# Patient Record
Sex: Male | Born: 1955 | Race: White | Hispanic: No | Marital: Married | Smoking: Former smoker
Health system: Southern US, Community
[De-identification: ages and names within clinical notes are randomized; demographics above are authoritative.]

## PROBLEM LIST (undated history)

## (undated) DIAGNOSIS — F329 Major depressive disorder, single episode, unspecified: Secondary | ICD-10-CM

## (undated) DIAGNOSIS — E291 Testicular hypofunction: Secondary | ICD-10-CM

## (undated) DIAGNOSIS — J309 Allergic rhinitis, unspecified: Secondary | ICD-10-CM

## (undated) DIAGNOSIS — K219 Gastro-esophageal reflux disease without esophagitis: Secondary | ICD-10-CM

## (undated) DIAGNOSIS — I10 Essential (primary) hypertension: Secondary | ICD-10-CM

## (undated) DIAGNOSIS — F32A Depression, unspecified: Secondary | ICD-10-CM

## (undated) DIAGNOSIS — E785 Hyperlipidemia, unspecified: Secondary | ICD-10-CM

## (undated) DIAGNOSIS — F419 Anxiety disorder, unspecified: Secondary | ICD-10-CM

## (undated) DIAGNOSIS — T7840XA Allergy, unspecified, initial encounter: Secondary | ICD-10-CM

## (undated) HISTORY — PX: INGUINAL HERNIA REPAIR: SHX194

## (undated) HISTORY — DX: Gastro-esophageal reflux disease without esophagitis: K21.9

## (undated) HISTORY — PX: GASTRIC FUNDOPLICATION: SHX226

## (undated) HISTORY — DX: Testicular hypofunction: E29.1

## (undated) HISTORY — DX: Allergic rhinitis, unspecified: J30.9

## (undated) HISTORY — DX: Major depressive disorder, single episode, unspecified: F32.9

## (undated) HISTORY — DX: Depression, unspecified: F32.A

## (undated) HISTORY — PX: CHOLECYSTECTOMY: SHX55

## (undated) HISTORY — DX: Anxiety disorder, unspecified: F41.9

## (undated) HISTORY — PX: BASAL CELL CARCINOMA EXCISION: SHX1214

## (undated) HISTORY — DX: Allergy, unspecified, initial encounter: T78.40XA

## (undated) HISTORY — PX: VASECTOMY: SHX75

## (undated) HISTORY — PX: PARTIAL NEPHRECTOMY: SHX414

## (undated) HISTORY — DX: Hyperlipidemia, unspecified: E78.5

## (undated) HISTORY — DX: Essential (primary) hypertension: I10

---

## 2009-07-14 ENCOUNTER — Ambulatory Visit: Payer: Self-pay | Admitting: Urology

## 2011-12-21 DIAGNOSIS — N4340 Spermatocele of epididymis, unspecified: Secondary | ICD-10-CM | POA: Insufficient documentation

## 2013-06-18 DIAGNOSIS — F419 Anxiety disorder, unspecified: Secondary | ICD-10-CM | POA: Insufficient documentation

## 2013-06-18 DIAGNOSIS — E559 Vitamin D deficiency, unspecified: Secondary | ICD-10-CM | POA: Insufficient documentation

## 2013-06-18 DIAGNOSIS — K219 Gastro-esophageal reflux disease without esophagitis: Secondary | ICD-10-CM | POA: Insufficient documentation

## 2013-06-18 DIAGNOSIS — E291 Testicular hypofunction: Secondary | ICD-10-CM | POA: Insufficient documentation

## 2014-01-12 DIAGNOSIS — Z79899 Other long term (current) drug therapy: Secondary | ICD-10-CM | POA: Insufficient documentation

## 2014-04-05 ENCOUNTER — Emergency Department: Payer: Self-pay | Admitting: Emergency Medicine

## 2014-04-07 ENCOUNTER — Emergency Department: Payer: Self-pay | Admitting: Emergency Medicine

## 2014-04-30 HISTORY — PX: COLONOSCOPY: SHX174

## 2014-04-30 LAB — HM COLONOSCOPY

## 2014-09-03 DIAGNOSIS — D3000 Benign neoplasm of unspecified kidney: Secondary | ICD-10-CM | POA: Insufficient documentation

## 2014-09-11 ENCOUNTER — Other Ambulatory Visit: Payer: Self-pay | Admitting: Family Medicine

## 2014-09-11 MED ORDER — FENOFIBRATE 145 MG PO TABS: 145.0000 mg | ORAL_TABLET | Freq: Every day | ORAL | Status: DC

## 2014-11-13 ENCOUNTER — Encounter: Payer: Self-pay | Admitting: Family Medicine

## 2014-11-13 ENCOUNTER — Ambulatory Visit (INDEPENDENT_AMBULATORY_CARE_PROVIDER_SITE_OTHER): Payer: BLUE CROSS/BLUE SHIELD | Admitting: Family Medicine

## 2014-11-13 VITALS — BP 117/78 | HR 89 | Temp 98.0°F | Resp 16 | Ht 72.0 in | Wt 209.6 lb

## 2014-11-13 DIAGNOSIS — I10 Essential (primary) hypertension: Secondary | ICD-10-CM

## 2014-11-13 DIAGNOSIS — D3002 Benign neoplasm of left kidney: Secondary | ICD-10-CM

## 2014-11-13 DIAGNOSIS — D3000 Benign neoplasm of unspecified kidney: Secondary | ICD-10-CM | POA: Insufficient documentation

## 2014-11-13 DIAGNOSIS — E291 Testicular hypofunction: Secondary | ICD-10-CM | POA: Insufficient documentation

## 2014-11-13 DIAGNOSIS — N2889 Other specified disorders of kidney and ureter: Secondary | ICD-10-CM | POA: Insufficient documentation

## 2014-11-13 DIAGNOSIS — K219 Gastro-esophageal reflux disease without esophagitis: Secondary | ICD-10-CM

## 2014-11-13 DIAGNOSIS — N529 Male erectile dysfunction, unspecified: Secondary | ICD-10-CM | POA: Insufficient documentation

## 2014-11-13 DIAGNOSIS — IMO0001 Reserved for inherently not codable concepts without codable children: Secondary | ICD-10-CM | POA: Insufficient documentation

## 2014-11-13 NOTE — Patient Instructions (Signed)
Continue medications

## 2014-11-13 NOTE — Progress Notes (Signed)
Name: Chris Daugherty   MRN: [id number]    DOB: 11/19/1955   Date:11/13/2014       Progress Note  Subjective  Chief Complaint  Chief Complaint  Patient presents with  . Hypertension    HPI  Here for f/u of HBP.  Had flair of siatica and back pain.  No problems now. No problem-specific assessment & plan notes found for this encounter.   Past Medical History  Diagnosis Date  . Hypertension   . Depression   . Allergy     Social History  Substance Use Topics  . Smoking status: Former Smoker    Quit date: 11/13/1974  . Smokeless tobacco: Never Used  . Alcohol Use: No     Current outpatient prescriptions:  .  amLODipine (NORVASC) 10 MG tablet, Take by mouth., Disp: , Rfl:  .  fenofibrate (TRICOR) 145 MG tablet, Take 1 tablet (145 mg total) by mouth daily., Disp: 90 tablet, Rfl: 0 .  losartan (COZAAR) 100 MG tablet, Take by mouth., Disp: , Rfl:  .  methocarbamol (ROBAXIN) 750 MG tablet, , Disp: , Rfl: 0 .  omeprazole (PRILOSEC) 20 MG capsule, Take 20 mg by mouth daily., Disp: , Rfl: 0 .  promethazine (PHENERGAN) 25 MG tablet, , Disp: , Rfl: 0 .  sertraline (ZOLOFT) 100 MG tablet, , Disp: , Rfl: 0 .  SUMAtriptan (IMITREX) 100 MG tablet, Take by mouth., Disp: , Rfl:  .  Vitamin D, Cholecalciferol, 400 UNITS TABS, Take by mouth., Disp: , Rfl:   Allergies  Allergen Reactions  . Acetaminophen-Codeine Rash    Review of Systems  Constitutional: Negative for fever, chills, weight loss and malaise/fatigue.  HENT: Negative for hearing loss.   Eyes: Negative for blurred vision and double vision.  Respiratory: Negative for cough, sputum production, shortness of breath and wheezing.   Cardiovascular: Negative for chest pain, palpitations, orthopnea and leg swelling.  Gastrointestinal: Negative for heartburn, nausea, vomiting, abdominal pain, diarrhea and blood in stool.  Genitourinary: Negative for dysuria, urgency and frequency.  Musculoskeletal: Positive for back pain.  Skin:  Negative for rash.  Neurological: Negative for dizziness, tingling, sensory change, focal weakness, weakness and headaches.  Psychiatric/Behavioral: Negative for depression.      Objective  Filed Vitals:   11/13/14 1120  BP: 117/78  Pulse: 89  Temp: 98 F (36.7 C)  Resp: 16  Height: 6' (1.829 m)  Weight: 209 lb 9.6 oz (95.074 kg)  SpO2: 99%     Physical Exam  Constitutional: He is oriented to person, place, and time and well-developed, well-nourished, and in no distress. No distress.  HENT:  Daugherty: Normocephalic and atraumatic.  Eyes: Conjunctivae and EOM are normal. Pupils are equal, round, and reactive to light.  Neck: Normal range of motion. Neck supple. Carotid bruit is not present. No thyromegaly present.  Cardiovascular: Normal rate, regular rhythm, normal heart sounds and intact distal pulses.  Exam reveals no gallop and no friction rub.   No murmur heard. Pulmonary/Chest: Effort normal and breath sounds normal. No respiratory distress. He has no wheezes. He has no rales.  Abdominal: Soft. Bowel sounds are normal. He exhibits no distension and no mass. There is no tenderness.  Musculoskeletal: Normal range of motion. He exhibits no edema.  Lymphadenopathy:    He has no cervical adenopathy.  Neurological: He is alert and oriented to person, place, and time.  Psychiatric: Mood, memory, affect and judgment normal.  Vitals reviewed.     No results found for this  or any previous visit (from the past 2160 hour(s)).   Assessment & Plan  1. Essential (primary) hypertension   2. Gastroesophageal reflux disease without esophagitis   3. Benign neoplasm of kidney, left

## 2014-11-19 ENCOUNTER — Telehealth: Payer: Self-pay | Admitting: Family Medicine

## 2014-11-19 NOTE — Telephone Encounter (Signed)
Relayed message that Dr. Luan Pulling is out of the office until 8/22. He will receive the msg at that time and we will f/u with his decision.

## 2014-11-19 NOTE — Telephone Encounter (Signed)
It doesn't look like he has ever discussed this with Dr. Luan Pulling. I would prefer to leave this decision up to him when he returns on Monday. Please inform the patient Dr. Luan Pulling on out of the office and will respond to his request on Monday.

## 2014-11-19 NOTE — Telephone Encounter (Signed)
Can we Rx this ? Chris Daugherty

## 2014-11-19 NOTE — Telephone Encounter (Signed)
Pt was seen by Dr. Manuella Ghazi about a year ago for migraines and was prescribed sethocarpamol 750 mg, sumatriptan 100 mg and promethazine 25 mg.  His prescription ran out and was told by Dr. Manuella Ghazi his primary doctor could fill them if he choose to do so.  Please call pt at [phone].

## 2014-11-23 NOTE — Telephone Encounter (Signed)
We have never really discussed his migraines at visits here.  I would need to see him specifically about these to decide to cont. These meds since I didn"t start them-jh

## 2014-11-23 NOTE — Telephone Encounter (Signed)
Called patient and relayed message per Dr. Luan Pulling. Appt scheduled 12/11/14, nothing further needed.

## 2014-12-03 ENCOUNTER — Other Ambulatory Visit: Payer: Self-pay | Admitting: Family Medicine

## 2014-12-11 ENCOUNTER — Encounter: Payer: Self-pay | Admitting: Family Medicine

## 2014-12-11 ENCOUNTER — Ambulatory Visit (INDEPENDENT_AMBULATORY_CARE_PROVIDER_SITE_OTHER): Payer: BLUE CROSS/BLUE SHIELD | Admitting: Family Medicine

## 2014-12-11 VITALS — BP 137/91 | HR 90 | Temp 98.0°F | Resp 16 | Ht 72.0 in | Wt 211.0 lb

## 2014-12-11 DIAGNOSIS — Z8669 Personal history of other diseases of the nervous system and sense organs: Secondary | ICD-10-CM | POA: Diagnosis not present

## 2014-12-11 DIAGNOSIS — Z23 Encounter for immunization: Secondary | ICD-10-CM

## 2014-12-11 DIAGNOSIS — G43909 Migraine, unspecified, not intractable, without status migrainosus: Secondary | ICD-10-CM | POA: Insufficient documentation

## 2014-12-11 MED ORDER — PROMETHAZINE HCL 25 MG PO TABS: 25.0000 mg | ORAL_TABLET | Freq: Four times a day (QID) | ORAL | Status: DC | PRN

## 2014-12-11 MED ORDER — SUMATRIPTAN SUCCINATE 100 MG PO TABS: 100.0000 mg | ORAL_TABLET | Freq: Once | ORAL | Status: DC

## 2014-12-11 MED ORDER — METHOCARBAMOL 750 MG PO TABS: ORAL_TABLET | ORAL | Status: DC

## 2014-12-11 NOTE — Progress Notes (Signed)
Name: Chris Daugherty   MRN: [id number]    DOB: 03-Oct-1955   Date:12/11/2014       Progress Note  Subjective  Chief Complaint  Chief Complaint  Patient presents with  . Migraine    have not had from past 2 month    HPI  Here for migraine headaches.  Has seen Dr. Manuella Ghazi in  The past and placed on medication (Imitrex, Phenergan, and Robaxin daily as prophylaxis.  Has not had a migraine in about 2 months.  Avg. About 10 migraines/yr, but seems less now. No problem-specific assessment & plan notes found for this encounter.   Past Medical History  Diagnosis Date  . Hypertension   . Depression   . Allergy     Social History  Substance Use Topics  . Smoking status: Former Smoker    Quit date: 11/13/1974  . Smokeless tobacco: Never Used  . Alcohol Use: No     Current outpatient prescriptions:  .  amLODipine (NORVASC) 10 MG tablet, Take by mouth., Disp: , Rfl:  .  fenofibrate (TRICOR) 145 MG tablet, take 1 tablet by mouth once daily, Disp: 90 tablet, Rfl: 0 .  losartan (COZAAR) 100 MG tablet, Take by mouth., Disp: , Rfl:  .  methocarbamol (ROBAXIN) 750 MG tablet, , Disp: , Rfl: 0 .  omeprazole (PRILOSEC) 20 MG capsule, Take 20 mg by mouth daily., Disp: , Rfl: 0 .  promethazine (PHENERGAN) 25 MG tablet, , Disp: , Rfl: 0 .  sertraline (ZOLOFT) 100 MG tablet, , Disp: , Rfl: 0 .  SUMAtriptan (IMITREX) 100 MG tablet, Take by mouth., Disp: , Rfl:  .  Vitamin D, Cholecalciferol, 400 UNITS TABS, Take by mouth., Disp: , Rfl:   Allergies  Allergen Reactions  . Acetaminophen-Codeine Rash    Review of Systems  Constitutional: Negative for fever and chills.  HENT: Negative for hearing loss.   Eyes: Negative for blurred vision and double vision.  Respiratory: Negative for cough, sputum production, shortness of breath and wheezing.   Cardiovascular: Negative for chest pain, palpitations and leg swelling.  Gastrointestinal: Negative for heartburn, abdominal pain and blood in stool.   Genitourinary: Negative for dysuria, urgency and frequency.  Skin: Negative for rash.  Neurological: Positive for headaches. Negative for dizziness, sensory change and focal weakness.      Objective  Filed Vitals:   12/11/14 1255  BP: 137/91  Pulse: 90  Temp: 98 F (36.7 C)  TempSrc: Oral  Resp: 16  Height: 6' (1.829 m)  Weight: 211 lb (95.709 kg)     Physical Exam  Constitutional: He is well-developed, well-nourished, and in no distress. No distress.  HENT:  Head: Normocephalic and atraumatic.  Eyes: Conjunctivae and EOM are normal. Pupils are equal, round, and reactive to light. No scleral icterus.  Fundoscopic exam:      The right eye shows no arteriolar narrowing, no AV nicking and no hemorrhage.       The left eye shows no arteriolar narrowing, no AV nicking and no hemorrhage.  Neck: Normal range of motion. Neck supple. No thyromegaly present.  Cardiovascular: Normal rate, regular rhythm, normal heart sounds and intact distal pulses.  Exam reveals no gallop and no friction rub.   No murmur heard. Pulmonary/Chest: Effort normal and breath sounds normal. No respiratory distress. He has no wheezes. He has no rales.  Lymphadenopathy:    He has no cervical adenopathy.  Neurological: He is alert. No cranial nerve deficit. Gait normal. Coordination normal.  Vitals  reviewed.     No results found for this or any previous visit (from the past 2160 hour(s)).   Assessment & Plan  1. Hx of migraine headaches   - methocarbamol (ROBAXIN) 750 MG tablet; Take 1 tablet twice a day.  Dispense: 60 tablet; Refill: 12 - promethazine (PHENERGAN) 25 MG tablet; Take 1 tablet (25 mg total) by mouth every 6 (six) hours as needed for nausea or vomiting.  Dispense: 30 tablet; Refill: 6 - SUMAtriptan (IMITREX) 100 MG tablet; Take 1 tablet (100 mg total) by mouth once.  Dispense: 9 tablet; Refill: 12  2. Need for influenza vaccination   - Flu Vaccine QUAD 36+ mos PF IM (Fluarix &  Fluzone Quad PF)

## 2015-02-26 ENCOUNTER — Other Ambulatory Visit: Payer: Self-pay | Admitting: Family Medicine

## 2015-03-19 ENCOUNTER — Encounter: Payer: Self-pay | Admitting: Family Medicine

## 2015-03-19 ENCOUNTER — Ambulatory Visit (INDEPENDENT_AMBULATORY_CARE_PROVIDER_SITE_OTHER): Payer: BLUE CROSS/BLUE SHIELD | Admitting: Family Medicine

## 2015-03-19 VITALS — BP 120/70 | HR 105 | Temp 97.8°F | Resp 16 | Ht 72.0 in | Wt 211.0 lb

## 2015-03-19 DIAGNOSIS — G4700 Insomnia, unspecified: Secondary | ICD-10-CM | POA: Diagnosis not present

## 2015-03-19 DIAGNOSIS — F419 Anxiety disorder, unspecified: Secondary | ICD-10-CM

## 2015-03-19 DIAGNOSIS — I10 Essential (primary) hypertension: Secondary | ICD-10-CM

## 2015-03-19 MED ORDER — TEMAZEPAM 7.5 MG PO CAPS
7.5000 mg | ORAL_CAPSULE | Freq: Every evening | ORAL | Status: DC | PRN
Start: 2015-03-19 — End: 2015-08-19

## 2015-03-19 NOTE — Addendum Note (Signed)
Addended by: Devona Konig on: 03/19/2015 11:29 AM   Modules accepted: Orders

## 2015-03-19 NOTE — Progress Notes (Signed)
Name: Chris Daugherty   MRN: [id number]    DOB: Nov 13, 1955   Date:03/19/2015       Progress Note  Subjective  Chief Complaint  Chief Complaint  Patient presents with  . Hypertension  . Migraine    1 a week ago  . Insomnia    HPI Here for f/u of HBP.  Has had some migraine HAs.  Had one earlier this week.  Took Imitrex and it resolved quickly.    He has some problems going to sleep.  Can stay asleep.  No problem-specific assessment & plan notes found for this encounter.   Past Medical History  Diagnosis Date  . Hypertension   . Depression   . Allergy     History reviewed. No pertinent past surgical history.  Family History  Problem Relation Age of Onset  . Cancer Maternal Grandfather 34    stomaCH    Social History   Social History  . Marital Status: Married    Spouse Name: N/A  . Number of Children: N/A  . Years of Education: N/A   Occupational History  . Not on file.   Social History Main Topics  . Smoking status: Former Smoker    Quit date: 11/13/1974  . Smokeless tobacco: Never Used  . Alcohol Use: No  . Drug Use: No  . Sexual Activity: Not on file   Other Topics Concern  . Not on file   Social History Narrative     Current outpatient prescriptions:  .  amLODipine (NORVASC) 10 MG tablet, Take by mouth., Disp: , Rfl:  .  fenofibrate (TRICOR) 145 MG tablet, take 1 tablet by mouth once daily, Disp: 90 tablet, Rfl: 3 .  losartan (COZAAR) 100 MG tablet, Take by mouth., Disp: , Rfl:  .  methocarbamol (ROBAXIN) 750 MG tablet, Take 1 tablet twice a day. (Patient taking differently: Take 750 mg by mouth every 8 (eight) hours as needed. Take 1 tablet twice a day.), Disp: 60 tablet, Rfl: 12 .  nystatin (MYCOSTATIN) 100000 UNIT/ML suspension, Place 1 mL into feeding tube as needed., Disp: , Rfl: 0 .  omeprazole (PRILOSEC) 20 MG capsule, Take 20 mg by mouth daily., Disp: , Rfl: 0 .  promethazine (PHENERGAN) 25 MG tablet, Take 1 tablet (25 mg total) by mouth  every 6 (six) hours as needed for nausea or vomiting., Disp: 30 tablet, Rfl: 6 .  sertraline (ZOLOFT) 100 MG tablet, , Disp: , Rfl: 0 .  SUMAtriptan (IMITREX) 100 MG tablet, Take 1 tablet (100 mg total) by mouth once., Disp: 9 tablet, Rfl: 12 .  Vitamin D, Cholecalciferol, 400 UNITS TABS, Take by mouth., Disp: , Rfl:  .  temazepam (RESTORIL) 7.5 MG capsule, Take 1 capsule (7.5 mg total) by mouth at bedtime as needed for sleep., Disp: 10 capsule, Rfl: 0  Allergies  Allergen Reactions  . Acetaminophen-Codeine Rash     Review of Systems  Constitutional: Negative for fever, chills, weight loss and malaise/fatigue.  HENT: Negative for hearing loss.   Eyes: Negative for blurred vision and double vision.  Respiratory: Negative for cough, sputum production, shortness of breath and wheezing.   Cardiovascular: Negative for chest pain, palpitations and leg swelling.  Gastrointestinal: Negative for heartburn, abdominal pain and blood in stool.  Genitourinary: Negative for dysuria, urgency and frequency.  Musculoskeletal: Negative for myalgias and joint pain.  Skin: Positive for rash.  Neurological: Positive for headaches (rare migraine). Negative for dizziness, tremors and weakness.  Psychiatric/Behavioral: The patient has insomnia.  Objective  Filed Vitals:   03/19/15 1021 03/19/15 1110  BP: 122/82 120/70  Pulse: 105   Temp: 97.8 F (36.6 C)   TempSrc: Oral   Resp: 16   Height: 6' (1.829 m)   Weight: 211 lb (95.709 kg)     Physical Exam  Constitutional: He is oriented to person, place, and time and well-developed, well-nourished, and in no distress. No distress.  HENT:  Head: Normocephalic and atraumatic.  Eyes: Conjunctivae and EOM are normal. Pupils are equal, round, and reactive to light. No scleral icterus.  Neck: Normal range of motion. Neck supple. No thyromegaly present.  Cardiovascular: Regular rhythm and normal heart sounds.  Tachycardia present.  Exam reveals no  gallop and no friction rub.   No murmur heard. Pulmonary/Chest: Effort normal and breath sounds normal. No respiratory distress. He has no wheezes. He has no rales.  Abdominal: Soft. Bowel sounds are normal. He exhibits no distension and no mass. There is no tenderness.  Musculoskeletal: Normal range of motion. He exhibits no edema.  Lymphadenopathy:    He has no cervical adenopathy.  Neurological: He is alert and oriented to person, place, and time.  Vitals reviewed.      No results found for this or any previous visit (from the past 2160 hour(s)).   Assessment & Plan  Problem List Items Addressed This Visit      Cardiovascular and Mediastinum   Essential (primary) hypertension - Primary     Other   Anxiety   Insomnia   Relevant Medications   temazepam (RESTORIL) 7.5 MG capsule      Meds ordered this encounter  Medications  . nystatin (MYCOSTATIN) 100000 UNIT/ML suspension    Sig: Place 1 mL into feeding tube as needed.    Refill:  0  . temazepam (RESTORIL) 7.5 MG capsule    Sig: Take 1 capsule (7.5 mg total) by mouth at bedtime as needed for sleep.    Dispense:  10 capsule    Refill:  0   1. Essential (primary) hypertension -cont. meds  2. Insomnia  - temazepam (RESTORIL) 7.5 MG capsule; Take 1 capsule (7.5 mg total) by mouth at bedtime as needed for sleep.  Dispense: 10 capsule; Refill: 0  3. Anxiety -cont. meds

## 2015-03-19 NOTE — Addendum Note (Signed)
Addended by: Devona Konig on: 03/19/2015 11:26 AM   Modules accepted: Orders

## 2015-03-19 NOTE — Addendum Note (Signed)
Addended by: Devona Konig on: 03/19/2015 11:25 AM   Modules accepted: Orders

## 2015-04-27 LAB — COMPREHENSIVE METABOLIC PANEL
A/G RATIO: 2 (ref 1.1–2.5)
ALK PHOS: 46 IU/L (ref 39–117)
ALT: 22 IU/L (ref 0–44)
AST: 18 IU/L (ref 0–40)
Albumin: 4.4 g/dL (ref 3.6–4.8)
BUN/Creatinine Ratio: 15 (ref 10–22)
BUN: 23 mg/dL (ref 8–27)
Bilirubin Total: 0.3 mg/dL (ref 0.0–1.2)
CHLORIDE: 99 mmol/L (ref 96–106)
CO2: 25 mmol/L (ref 18–29)
Calcium: 9.1 mg/dL (ref 8.6–10.2)
Creatinine, Ser: 1.53 mg/dL — ABNORMAL HIGH (ref 0.76–1.27)
GFR calc Af Amer: 56 mL/min/{1.73_m2} — ABNORMAL LOW (ref >59)
GFR calc non Af Amer: 49 mL/min/{1.73_m2} — ABNORMAL LOW (ref >59)
GLOBULIN, TOTAL: 2.2 g/dL (ref 1.5–4.5)
Glucose: 104 mg/dL — ABNORMAL HIGH (ref 65–99)
POTASSIUM: 4.2 mmol/L (ref 3.5–5.2)
SODIUM: 141 mmol/L (ref 134–144)
Total Protein: 6.6 g/dL (ref 6.0–8.5)

## 2015-04-27 LAB — CBC WITH DIFFERENTIAL/PLATELET
BASOS ABS: 0.1 10*3/uL (ref 0.0–0.2)
Basos: 2 %
EOS (ABSOLUTE): 0.1 10*3/uL (ref 0.0–0.4)
Eos: 4 %
HEMOGLOBIN: 12.9 g/dL (ref 12.6–17.7)
Hematocrit: 36.7 % — ABNORMAL LOW (ref 37.5–51.0)
Immature Grans (Abs): 0 10*3/uL (ref 0.0–0.1)
Immature Granulocytes: 0 %
LYMPHS ABS: 1.1 10*3/uL (ref 0.7–3.1)
Lymphs: 33 %
MCH: 29.7 pg (ref 26.6–33.0)
MCHC: 35.1 g/dL (ref 31.5–35.7)
MCV: 84 fL (ref 79–97)
MONOCYTES: 9 %
MONOS ABS: 0.3 10*3/uL (ref 0.1–0.9)
NEUTROS ABS: 1.6 10*3/uL (ref 1.4–7.0)
Neutrophils: 52 %
Platelets: 172 10*3/uL (ref 150–379)
RBC: 4.35 x10E6/uL (ref 4.14–5.80)
RDW: 12.9 % (ref 12.3–15.4)
WBC: 3.2 10*3/uL — ABNORMAL LOW (ref 3.4–10.8)

## 2015-04-27 LAB — LIPID PANEL
CHOLESTEROL TOTAL: 190 mg/dL (ref 100–199)
Chol/HDL Ratio: 5.6 ratio units — ABNORMAL HIGH (ref 0.0–5.0)
HDL: 34 mg/dL — ABNORMAL LOW (ref >39)
LDL Calculated: 116 mg/dL — ABNORMAL HIGH (ref 0–99)
TRIGLYCERIDES: 199 mg/dL — AB (ref 0–149)
VLDL Cholesterol Cal: 40 mg/dL (ref 5–40)

## 2015-04-29 LAB — HGB A1C W/O EAG: Hgb A1c MFr Bld: 5.5 % (ref 4.8–5.6)

## 2015-04-29 LAB — SPECIMEN STATUS REPORT

## 2015-06-17 ENCOUNTER — Encounter: Payer: Self-pay | Admitting: Family Medicine

## 2015-06-17 ENCOUNTER — Ambulatory Visit (INDEPENDENT_AMBULATORY_CARE_PROVIDER_SITE_OTHER): Payer: Managed Care, Other (non HMO) | Admitting: Family Medicine

## 2015-06-17 VITALS — BP 118/74 | HR 79 | Temp 98.0°F | Resp 16 | Ht 72.0 in | Wt 218.0 lb

## 2015-06-17 DIAGNOSIS — I10 Essential (primary) hypertension: Secondary | ICD-10-CM | POA: Diagnosis not present

## 2015-06-17 DIAGNOSIS — B349 Viral infection, unspecified: Secondary | ICD-10-CM

## 2015-06-17 NOTE — Progress Notes (Signed)
Name: Chris Daugherty   MRN: [id number]    DOB: [birth year]   Date:06/17/2015       Progress Note  Subjective  Chief Complaint  Chief Complaint  Patient presents with  . Hypertension    HPI Here for f/u of HBP.  He was treated for an URI at urgent care and was treated with Amoxil x 10 days.  He had aches,. chills, HA, cough, congestion, and headache.  Still with fatigue.  No problem-specific assessment & plan notes found for this encounter.   Past Medical History  Diagnosis Date  . Hypertension   . Depression   . Allergy     History reviewed. No pertinent past surgical history.  Family History  Problem Relation Age of Onset  . Cancer Maternal Grandfather 37    stomaCH    Social History   Social History  . Marital Status: Married    Spouse Name: N/A  . Number of Children: N/A  . Years of Education: N/A   Occupational History  . Not on file.   Social History Main Topics  . Smoking status: Former Smoker    Quit date: 11/13/1974  . Smokeless tobacco: Never Used  . Alcohol Use: No  . Drug Use: No  . Sexual Activity: Not on file   Other Topics Concern  . Not on file   Social History Narrative     Current outpatient prescriptions:  .  amLODipine (NORVASC) 10 MG tablet, Take by mouth., Disp: , Rfl:  .  amoxicillin-clavulanate (AUGMENTIN) 875-125 MG tablet, Take by mouth., Disp: , Rfl:  .  fenofibrate (TRICOR) 145 MG tablet, take 1 tablet by mouth once daily, Disp: 90 tablet, Rfl: 3 .  losartan (COZAAR) 100 MG tablet, Take by mouth., Disp: , Rfl:  .  methocarbamol (ROBAXIN) 750 MG tablet, Take 1 tablet twice a day. (Patient taking differently: Take 750 mg by mouth every 8 (eight) hours as needed. Take 1 tablet twice a day.), Disp: 60 tablet, Rfl: 12 .  nystatin (MYCOSTATIN) 100000 UNIT/ML suspension, Place 1 mL into feeding tube as needed., Disp: , Rfl: 0 .  omeprazole (PRILOSEC) 20 MG capsule, Take 20 mg by mouth daily., Disp: , Rfl: 0 .  promethazine (PHENERGAN)  25 MG tablet, Take 1 tablet (25 mg total) by mouth every 6 (six) hours as needed for nausea or vomiting., Disp: 30 tablet, Rfl: 6 .  sertraline (ZOLOFT) 100 MG tablet, , Disp: , Rfl: 0 .  SUMAtriptan (IMITREX) 100 MG tablet, Take 1 tablet (100 mg total) by mouth once., Disp: 9 tablet, Rfl: 12 .  temazepam (RESTORIL) 7.5 MG capsule, Take 1 capsule (7.5 mg total) by mouth at bedtime as needed for sleep., Disp: 10 capsule, Rfl: 0 .  Vitamin D, Cholecalciferol, 400 UNITS TABS, Take by mouth., Disp: , Rfl:   No Known Allergies   Review of Systems  Constitutional: Positive for malaise/fatigue. Negative for fever, chills and weight loss.  HENT: Positive for congestion (mild). Negative for hearing loss.   Eyes: Negative for blurred vision and double vision.  Respiratory: Positive for cough (mild). Negative for shortness of breath and wheezing.   Cardiovascular: Negative for chest pain, palpitations and leg swelling.  Gastrointestinal: Negative for heartburn, abdominal pain and blood in stool.  Genitourinary: Negative for dysuria, urgency and frequency.  Musculoskeletal: Negative for myalgias.  Skin: Negative for rash.  Neurological: Positive for weakness. Negative for headaches.      Objective  Filed Vitals:   06/17/15 1020  BP: 118/74  Pulse: 79  Temp: 98 F (36.7 C)  TempSrc: Oral  Resp: 16  Height: 6' (1.829 m)  Weight: 218 lb (98.884 kg)    Physical Exam  Constitutional: He is oriented to person, place, and time and well-developed, well-nourished, and in no distress. No distress.  HENT:  Head: Normocephalic and atraumatic.  Eyes: Conjunctivae and EOM are normal. Pupils are equal, round, and reactive to light.  Neck: Normal range of motion. Neck supple. Carotid bruit is present. No thyromegaly present.  Cardiovascular: Normal rate, regular rhythm and normal heart sounds.  Exam reveals no gallop and no friction rub.   No murmur heard. Pulmonary/Chest: Effort normal and breath  sounds normal. No respiratory distress. He has no wheezes. He has no rales.  Abdominal: Soft. Bowel sounds are normal. He exhibits no distension and no mass. There is no tenderness.  Musculoskeletal: He exhibits no edema.  Lymphadenopathy:    He has no cervical adenopathy.  Neurological: He is alert and oriented to person, place, and time.  Skin: Skin is warm and dry.  Vitals reviewed.      Recent Results (from the past 2160 hour(s))  CBC with Differential     Status: Abnormal   Collection Time: 04/26/15  9:19 AM  Result Value Ref Range   WBC 3.2 (L) 3.4 - 10.8 x10E3/uL   RBC 4.35 4.14 - 5.80 x10E6/uL   Hemoglobin 12.9 12.6 - 17.7 g/dL   Hematocrit 36.7 (L) 37.5 - 51.0 %   MCV 84 79 - 97 fL   MCH 29.7 26.6 - 33.0 pg   MCHC 35.1 31.5 - 35.7 g/dL   RDW 12.9 12.3 - 15.4 %   Platelets 172 150 - 379 x10E3/uL   Neutrophils 52 %   Lymphs 33 %   Monocytes 9 %   Eos 4 %   Basos 2 %   Neutrophils Absolute 1.6 1.4 - 7.0 x10E3/uL   Lymphocytes Absolute 1.1 0.7 - 3.1 x10E3/uL   Monocytes Absolute 0.3 0.1 - 0.9 x10E3/uL   EOS (ABSOLUTE) 0.1 0.0 - 0.4 x10E3/uL   Basophils Absolute 0.1 0.0 - 0.2 x10E3/uL   Immature Granulocytes 0 %   Immature Grans (Abs) 0.0 0.0 - 0.1 x10E3/uL  Comprehensive metabolic panel     Status: Abnormal   Collection Time: 04/26/15  9:19 AM  Result Value Ref Range   Glucose 104 (H) 65 - 99 mg/dL   BUN 23 8 - 27 mg/dL   Creatinine, Ser 1.53 (H) 0.76 - 1.27 mg/dL   GFR calc non Af Amer 49 (L) >59 mL/min/1.73   GFR calc Af Amer 56 (L) >59 mL/min/1.73   BUN/Creatinine Ratio 15 10 - 22   Sodium 141 134 - 144 mmol/L   Potassium 4.2 3.5 - 5.2 mmol/L   Chloride 99 96 - 106 mmol/L   CO2 25 18 - 29 mmol/L   Calcium 9.1 8.6 - 10.2 mg/dL   Total Protein 6.6 6.0 - 8.5 g/dL   Albumin 4.4 3.6 - 4.8 g/dL   Globulin, Total 2.2 1.5 - 4.5 g/dL   Albumin/Globulin Ratio 2.0 1.1 - 2.5   Bilirubin Total 0.3 0.0 - 1.2 mg/dL   Alkaline Phosphatase 46 39 - 117 IU/L   AST 18 0  - 40 IU/L   ALT 22 0 - 44 IU/L  Lipid panel     Status: Abnormal   Collection Time: 04/26/15  9:19 AM  Result Value Ref Range   Cholesterol, Total 190 100 -  199 mg/dL   Triglycerides 199 (H) 0 - 149 mg/dL   HDL 34 (L) >39 mg/dL   VLDL Cholesterol Cal 40 5 - 40 mg/dL   LDL Calculated 116 (H) 0 - 99 mg/dL   Chol/HDL Ratio 5.6 (H) 0.0 - 5.0 ratio units    Comment:                                   T. Chol/HDL Ratio                                             Men  Women                               1/2 Avg.Risk  3.4    3.3                                   Avg.Risk  5.0    4.4                                2X Avg.Risk  9.6    7.1                                3X Avg.Risk 23.4   11.0   Hgb A1c w/o eAG     Status: None   Collection Time: 04/26/15  9:19 AM  Result Value Ref Range   Hgb A1c MFr Bld 5.5 4.8 - 5.6 %    Comment:          Pre-diabetes: 5.7 - 6.4          Diabetes: >6.4          Glycemic control for adults with diabetes: <7.0   Specimen status report     Status: None   Collection Time: 04/26/15  9:19 AM  Result Value Ref Range   specimen status report Comment     Comment: Written Authorization Written Authorization Written Authorization Received. Authorization received from [employer] 04-28-2015 Logged by [facility]  Problem List Items Addressed This Visit      Cardiovascular and Mediastinum   Essential (primary) hypertension - Primary     Other   Viral syndrome      Meds ordered this encounter  Medications  . amoxicillin-clavulanate (AUGMENTIN) 875-125 MG tablet    Sig: Take by mouth.   1. Essential (primary) hypertension   2. Viral syndrome  - CBC with Differential - Comprehensive Metabolic Panel (CMET)

## 2015-06-18 LAB — CBC WITH DIFFERENTIAL/PLATELET
BASOS ABS: 0 10*3/uL (ref 0.0–0.2)
Basos: 1 %
EOS (ABSOLUTE): 0.1 10*3/uL (ref 0.0–0.4)
Eos: 3 %
HEMOGLOBIN: 12.9 g/dL (ref 12.6–17.7)
Hematocrit: 37.6 % (ref 37.5–51.0)
Immature Grans (Abs): 0 10*3/uL (ref 0.0–0.1)
Immature Granulocytes: 0 %
LYMPHS ABS: 1.1 10*3/uL (ref 0.7–3.1)
Lymphs: 25 %
MCH: 28.9 pg (ref 26.6–33.0)
MCHC: 34.3 g/dL (ref 31.5–35.7)
MCV: 84 fL (ref 79–97)
MONOS ABS: 0.4 10*3/uL (ref 0.1–0.9)
Monocytes: 9 %
NEUTROS ABS: 2.7 10*3/uL (ref 1.4–7.0)
Neutrophils: 62 %
PLATELETS: 183 10*3/uL (ref 150–379)
RBC: 4.46 x10E6/uL (ref 4.14–5.80)
RDW: 13.7 % (ref 12.3–15.4)
WBC: 4.4 10*3/uL (ref 3.4–10.8)

## 2015-06-18 LAB — COMPREHENSIVE METABOLIC PANEL
ALK PHOS: 49 IU/L (ref 39–117)
ALT: 25 IU/L (ref 0–44)
AST: 22 IU/L (ref 0–40)
Albumin/Globulin Ratio: 2.2 (ref 1.2–2.2)
Albumin: 4.8 g/dL (ref 3.6–4.8)
BILIRUBIN TOTAL: 0.3 mg/dL (ref 0.0–1.2)
BUN/Creatinine Ratio: 17 (ref 10–22)
BUN: 23 mg/dL (ref 8–27)
CHLORIDE: 97 mmol/L (ref 96–106)
CO2: 26 mmol/L (ref 18–29)
CREATININE: 1.34 mg/dL — AB (ref 0.76–1.27)
Calcium: 9.4 mg/dL (ref 8.6–10.2)
GFR calc Af Amer: 66 mL/min/{1.73_m2} (ref >59)
GFR calc non Af Amer: 57 mL/min/{1.73_m2} — ABNORMAL LOW (ref >59)
GLUCOSE: 110 mg/dL — AB (ref 65–99)
Globulin, Total: 2.2 g/dL (ref 1.5–4.5)
Potassium: 4.6 mmol/L (ref 3.5–5.2)
Sodium: 141 mmol/L (ref 134–144)
Total Protein: 7 g/dL (ref 6.0–8.5)

## 2015-07-20 ENCOUNTER — Other Ambulatory Visit: Payer: Self-pay | Admitting: Family Medicine

## 2015-07-20 MED ORDER — LOSARTAN POTASSIUM 100 MG PO TABS: 100.0000 mg | ORAL_TABLET | Freq: Every day | ORAL | Status: DC

## 2015-07-27 ENCOUNTER — Other Ambulatory Visit: Payer: Self-pay | Admitting: Family Medicine

## 2015-07-27 DIAGNOSIS — Z8669 Personal history of other diseases of the nervous system and sense organs: Secondary | ICD-10-CM

## 2015-07-27 MED ORDER — SUMATRIPTAN SUCCINATE 100 MG PO TABS
100.0000 mg | ORAL_TABLET | Freq: Once | ORAL | Status: DC
Start: 2015-07-27 — End: 2016-07-12

## 2015-07-27 MED ORDER — OMEPRAZOLE 20 MG PO CPDR: 20.0000 mg | DELAYED_RELEASE_CAPSULE | Freq: Every day | ORAL | Status: DC

## 2015-07-27 MED ORDER — AMLODIPINE BESYLATE 10 MG PO TABS: 10.0000 mg | ORAL_TABLET | Freq: Every day | ORAL | Status: DC

## 2015-07-27 MED ORDER — PROMETHAZINE HCL 25 MG PO TABS: 25.0000 mg | ORAL_TABLET | Freq: Four times a day (QID) | ORAL | Status: DC | PRN

## 2015-07-27 MED ORDER — FENOFIBRATE 145 MG PO TABS: 145.0000 mg | ORAL_TABLET | Freq: Every day | ORAL | Status: DC

## 2015-07-27 MED ORDER — LOSARTAN POTASSIUM 100 MG PO TABS: 100.0000 mg | ORAL_TABLET | Freq: Every day | ORAL | Status: DC

## 2015-07-27 MED ORDER — SERTRALINE HCL 100 MG PO TABS: 100.0000 mg | ORAL_TABLET | Freq: Every day | ORAL | Status: DC

## 2015-07-27 NOTE — Telephone Encounter (Signed)
Patient needs mail order on all prescriptions due to insurance change.

## 2015-07-30 ENCOUNTER — Other Ambulatory Visit: Payer: Self-pay | Admitting: Family Medicine

## 2015-07-30 DIAGNOSIS — Z8669 Personal history of other diseases of the nervous system and sense organs: Secondary | ICD-10-CM

## 2015-08-02 MED ORDER — METHOCARBAMOL 750 MG PO TABS: ORAL_TABLET | ORAL | Status: DC

## 2015-08-19 ENCOUNTER — Ambulatory Visit (INDEPENDENT_AMBULATORY_CARE_PROVIDER_SITE_OTHER): Payer: Managed Care, Other (non HMO) | Admitting: Family Medicine

## 2015-08-19 ENCOUNTER — Encounter: Payer: Self-pay | Admitting: Family Medicine

## 2015-08-19 VITALS — BP 125/80 | HR 82 | Temp 98.0°F | Resp 16 | Ht 72.0 in | Wt 218.0 lb

## 2015-08-19 DIAGNOSIS — E785 Hyperlipidemia, unspecified: Secondary | ICD-10-CM

## 2015-08-19 DIAGNOSIS — I10 Essential (primary) hypertension: Secondary | ICD-10-CM

## 2015-08-19 DIAGNOSIS — N289 Disorder of kidney and ureter, unspecified: Secondary | ICD-10-CM | POA: Diagnosis not present

## 2015-08-19 NOTE — Progress Notes (Signed)
Name: Chris Daugherty   MRN: [id number]    DOB: Jun 06, 1955   Date:08/19/2015       Progress Note  Subjective  Chief Complaint  Chief Complaint  Patient presents with  . Hypertension    HPI  Here for f/u of HBP, renal insufficiency, elevated lipids.  Feeling well.  Taking meds  No problem-specific assessment & plan notes found for this encounter.   Past Medical History  Diagnosis Date  . Hypertension   . Depression   . Allergy     History reviewed. No pertinent past surgical history.  Family History  Problem Relation Age of Onset  . Cancer Maternal Grandfather 33    stoma[facility]    Social History   Social History  . Marital Status: Married    Spouse Name: N/A  . Number of Children: N/A  . Years of Education: N/A   Occupational History  . Not on file.   Social History Main Topics  . Smoking status: Former Smoker    Quit date: 11/13/1974  . Smokeless tobacco: Never Used  . Alcohol Use: No  . Drug Use: No  . Sexual Activity: Not on file   Other Topics Concern  . Not on file   Social History Narrative     Current outpatient prescriptions:  .  amLODipine (NORVASC) 10 MG tablet, Take 1 tablet (10 mg total) by mouth daily., Disp: 90 tablet, Rfl: 3 .  fenofibrate (TRICOR) 145 MG tablet, Take 1 tablet (145 mg total) by mouth daily., Disp: 90 tablet, Rfl: 3 .  losartan (COZAAR) 100 MG tablet, Take 1 tablet (100 mg total) by mouth daily., Disp: 90 tablet, Rfl: 3 .  methocarbamol (ROBAXIN) 750 MG tablet, Take 1 tablet twice a day., Disp: 180 tablet, Rfl: 3 .  omeprazole (PRILOSEC) 20 MG capsule, Take 1 capsule (20 mg total) by mouth daily., Disp: 90 capsule, Rfl: 3 .  promethazine (PHENERGAN) 25 MG tablet, Take 1 tablet (25 mg total) by mouth every 6 (six) hours as needed for nausea or vomiting., Disp: 90 tablet, Rfl: 2 .  sertraline (ZOLOFT) 100 MG tablet, Take 1 tablet (100 mg total) by mouth at bedtime., Disp: 90 tablet, Rfl: 3 .  SUMAtriptan (IMITREX) 100 MG tablet,  Take 1 tablet (100 mg total) by mouth once., Disp: 27 tablet, Rfl: 3 .  triamcinolone cream (KENALOG) 0.1 %, Apply 1 application topically once a week., Disp: , Rfl: 0 .  Vitamin D, Cholecalciferol, 400 UNITS TABS, Take by mouth., Disp: , Rfl:   No Known Allergies   Review of Systems  Constitutional: Negative for fever, chills, weight loss and malaise/fatigue.  HENT: Negative for hearing loss.   Eyes: Negative for blurred vision and double vision.  Respiratory: Negative for cough, shortness of breath and wheezing.   Cardiovascular: Negative for chest pain, palpitations and leg swelling.  Gastrointestinal: Negative for heartburn, abdominal pain and blood in stool.  Genitourinary: Negative for dysuria, urgency and frequency.  Musculoskeletal: Negative for myalgias and joint pain.  Skin: Negative for rash.  Neurological: Negative for dizziness, tremors, weakness and headaches.      Objective  Filed Vitals:   08/19/15 1051 08/19/15 1141  BP: 124/80 125/80  Pulse: 82   Temp: 98 F (36.7 C)   TempSrc: Oral   Resp: 16   Height: 6' (1.829 m)   Weight: 218 lb (98.884 kg)     Physical Exam  Constitutional: He is oriented to person, place, and time and well-developed, well-nourished, and in  no distress. No distress.  HENT:  Head: Normocephalic and atraumatic.  Eyes: Conjunctivae and EOM are normal. Pupils are equal, round, and reactive to light.  Neck: Normal range of motion. Neck supple. Carotid bruit is not present. No thyromegaly present.  Cardiovascular: Normal rate and normal heart sounds.  Exam reveals no gallop and no friction rub.   No murmur heard. Pulmonary/Chest: Effort normal and breath sounds normal. No respiratory distress. He has no wheezes. He has no rales.  Abdominal: Soft. Bowel sounds are normal. He exhibits no distension and no mass. There is no tenderness.  Musculoskeletal: He exhibits no edema.  Lymphadenopathy:    He has no cervical adenopathy.   Neurological: He is alert and oriented to person, place, and time.  Vitals reviewed.      Recent Results (from the past 2160 hour(s))  CBC with Differential     Status: None   Collection Time: 06/17/15  1:46 PM  Result Value Ref Range   WBC 4.4 3.4 - 10.8 x10E3/uL   RBC 4.46 4.14 - 5.80 x10E6/uL   Hemoglobin 12.9 12.6 - 17.7 g/dL   Hematocrit 37.6 37.5 - 51.0 %   MCV 84 79 - 97 fL   MCH 28.9 26.6 - 33.0 pg   MCHC 34.3 31.5 - 35.7 g/dL   RDW 13.7 12.3 - 15.4 %   Platelets 183 150 - 379 x10E3/uL   Neutrophils 62 %   Lymphs 25 %   Monocytes 9 %   Eos 3 %   Basos 1 %   Neutrophils Absolute 2.7 1.4 - 7.0 x10E3/uL   Lymphocytes Absolute 1.1 0.7 - 3.1 x10E3/uL   Monocytes Absolute 0.4 0.1 - 0.9 x10E3/uL   EOS (ABSOLUTE) 0.1 0.0 - 0.4 x10E3/uL   Basophils Absolute 0.0 0.0 - 0.2 x10E3/uL   Immature Granulocytes 0 %   Immature Grans (Abs) 0.0 0.0 - 0.1 x10E3/uL  Comprehensive Metabolic Panel (CMET)     Status: Abnormal   Collection Time: 06/17/15  1:46 PM  Result Value Ref Range   Glucose 110 (H) 65 - 99 mg/dL   BUN 23 8 - 27 mg/dL   Creatinine, Ser 1.34 (H) 0.76 - 1.27 mg/dL   GFR calc non Af Amer 57 (L) >59 mL/min/1.73   GFR calc Af Amer 66 >59 mL/min/1.73   BUN/Creatinine Ratio 17 10 - 22   Sodium 141 134 - 144 mmol/L   Potassium 4.6 3.5 - 5.2 mmol/L   Chloride 97 96 - 106 mmol/L   CO2 26 18 - 29 mmol/L   Calcium 9.4 8.6 - 10.2 mg/dL   Total Protein 7.0 6.0 - 8.5 g/dL   Albumin 4.8 3.6 - 4.8 g/dL   Globulin, Total 2.2 1.5 - 4.5 g/dL   Albumin/Globulin Ratio 2.2 1.2 - 2.2    Comment:               **Please note reference interval change**   Bilirubin Total 0.3 0.0 - 1.2 mg/dL   Alkaline Phosphatase 49 39 - 117 IU/L   AST 22 0 - 40 IU/L   ALT 25 0 - 44 IU/L     Assessment & Plan  Problem List Items Addressed This Visit      Cardiovascular and Mediastinum   Essential (primary) hypertension - Primary     Genitourinary   Renal insufficiency   Relevant Orders    Comprehensive Metabolic Panel (CMET)     Other   Hyperlipidemia      Meds ordered this  encounter  Medications  . triamcinolone cream (KENALOG) 0.1 %    Sig: Apply 1 application topically once a week.    Refill:  0   1. Essential (primary) hypertension Cont. Amlodipine and Losartan  2. Hyperlipidemia   3. Renal insufficiency  - Comprehensive Metabolic Panel (CMET)

## 2015-09-07 LAB — COMPREHENSIVE METABOLIC PANEL
ALBUMIN: 4.7 g/dL (ref 3.6–4.8)
ALT: 17 IU/L (ref 0–44)
AST: 19 IU/L (ref 0–40)
Albumin/Globulin Ratio: 2 (ref 1.2–2.2)
Alkaline Phosphatase: 52 IU/L (ref 39–117)
BUN / CREAT RATIO: 16 (ref 10–24)
BUN: 21 mg/dL (ref 8–27)
Bilirubin Total: 0.3 mg/dL (ref 0.0–1.2)
CALCIUM: 9.5 mg/dL (ref 8.6–10.2)
CO2: 25 mmol/L (ref 18–29)
CREATININE: 1.3 mg/dL — AB (ref 0.76–1.27)
Chloride: 97 mmol/L (ref 96–106)
GFR calc non Af Amer: 59 mL/min/{1.73_m2} — ABNORMAL LOW (ref >59)
GFR, EST AFRICAN AMERICAN: 69 mL/min/{1.73_m2} (ref >59)
GLUCOSE: 107 mg/dL — AB (ref 65–99)
Globulin, Total: 2.4 g/dL (ref 1.5–4.5)
Potassium: 4.2 mmol/L (ref 3.5–5.2)
Sodium: 139 mmol/L (ref 134–144)
TOTAL PROTEIN: 7.1 g/dL (ref 6.0–8.5)

## 2015-09-28 ENCOUNTER — Telehealth: Payer: Self-pay | Admitting: Family Medicine

## 2015-09-28 NOTE — Telephone Encounter (Signed)
Yes.  He can get Shingles shot.  Just be sure to avoid newborn babies (under 1 year) and older people with immune compromise (like on chemo) because he could give them Chicken pox for1-2 months after getting vaccine.-jh

## 2015-09-28 NOTE — Telephone Encounter (Signed)
Patient was informed by pharmacist his ins does cover shingles vaccine.  He wanted to be sure it is ok to proceed.Chris Daugherty

## 2015-09-28 NOTE — Telephone Encounter (Signed)
Pt was in [place] and pharmacist asked him about getting the flu shot.  He just wanted to make sure that was ok with Dr. Luan Pulling.  His call back number is [phone]

## 2015-12-09 ENCOUNTER — Ambulatory Visit (INDEPENDENT_AMBULATORY_CARE_PROVIDER_SITE_OTHER): Payer: Managed Care, Other (non HMO) | Admitting: Family Medicine

## 2015-12-09 ENCOUNTER — Other Ambulatory Visit: Payer: Self-pay | Admitting: *Deleted

## 2015-12-09 ENCOUNTER — Encounter: Payer: Self-pay | Admitting: *Deleted

## 2015-12-09 ENCOUNTER — Encounter: Payer: Self-pay | Admitting: Family Medicine

## 2015-12-09 VITALS — BP 121/78 | HR 82 | Temp 97.8°F | Resp 16 | Ht 72.0 in | Wt 222.6 lb

## 2015-12-09 DIAGNOSIS — J302 Other seasonal allergic rhinitis: Secondary | ICD-10-CM

## 2015-12-09 DIAGNOSIS — I10 Essential (primary) hypertension: Secondary | ICD-10-CM | POA: Diagnosis not present

## 2015-12-09 DIAGNOSIS — F419 Anxiety disorder, unspecified: Secondary | ICD-10-CM

## 2015-12-09 MED ORDER — FLUTICASONE PROPIONATE 50 MCG/ACT NA SUSP: 2.0000 | Freq: Every day | NASAL | 6 refills | Status: DC

## 2015-12-09 MED ORDER — BENZONATATE 100 MG PO CAPS: 100.0000 mg | ORAL_CAPSULE | Freq: Two times a day (BID) | ORAL | 0 refills | Status: DC | PRN

## 2015-12-09 NOTE — Progress Notes (Signed)
Name: Chris Daugherty   MRN: [id number]    DOB: January 13, 1956   Date:12/09/2015       Progress Note  Subjective  Chief Complaint  Chief Complaint  Patient presents with  . Hypertension    HPI Here for f/u of HBP.  He is taking his meds.  Does c/o some restlessness of legs on an occ basis.  Also has recurrence of pnd and cough and scratchy throat of past 2-3 days.  No fever.  Some mild throaty cough.  No problem-specific Assessment & Plan notes found for this encounter.   Past Medical History:  Diagnosis Date  . Allergy   . Depression   . Hypertension     History reviewed. No pertinent surgical history.  Family History  Problem Relation Age of Onset  . Cancer Maternal Grandfather 33    stomaCH    Social History   Social History  . Marital status: Married    Spouse name: N/A  . Number of children: N/A  . Years of education: N/A   Occupational History  . Not on file.   Social History Main Topics  . Smoking status: Former Smoker    Quit date: 11/13/1974  . Smokeless tobacco: Never Used  . Alcohol use No  . Drug use: No  . Sexual activity: Not on file   Other Topics Concern  . Not on file   Social History Narrative  . No narrative on file     Current Outpatient Prescriptions:  .  amLODipine (NORVASC) 10 MG tablet, Take 1 tablet (10 mg total) by mouth daily., Disp: 90 tablet, Rfl: 3 .  fenofibrate (TRICOR) 145 MG tablet, Take 1 tablet (145 mg total) by mouth daily., Disp: 90 tablet, Rfl: 3 .  losartan (COZAAR) 100 MG tablet, Take 1 tablet (100 mg total) by mouth daily., Disp: 90 tablet, Rfl: 3 .  methocarbamol (ROBAXIN) 750 MG tablet, Take 1 tablet twice a day., Disp: 180 tablet, Rfl: 3 .  omeprazole (PRILOSEC) 20 MG capsule, Take 1 capsule (20 mg total) by mouth daily., Disp: 90 capsule, Rfl: 3 .  promethazine (PHENERGAN) 25 MG tablet, Take 1 tablet (25 mg total) by mouth every 6 (six) hours as needed for nausea or vomiting., Disp: 90 tablet, Rfl: 2 .  sertraline  (ZOLOFT) 100 MG tablet, Take 1 tablet (100 mg total) by mouth at bedtime., Disp: 90 tablet, Rfl: 3 .  SUMAtriptan (IMITREX) 100 MG tablet, Take 1 tablet (100 mg total) by mouth once., Disp: 27 tablet, Rfl: 3 .  triamcinolone cream (KENALOG) 0.1 %, Apply 1 application topically once a week., Disp: , Rfl: 0 .  Vitamin D, Cholecalciferol, 400 UNITS TABS, Take by mouth., Disp: , Rfl:  .  benzonatate (TESSALON) 100 MG capsule, Take 1 capsule (100 mg total) by mouth 2 (two) times daily as needed for cough., Disp: 20 capsule, Rfl: 0 .  BOOSTRIX 5-2.5-18.5 injection, Inject 0.5 mLs into the muscle once., Disp: , Rfl: 0 .  fexofenadine (ALLEGRA) 180 MG tablet, Take 1 tablet by mouth 2 (two) times daily as needed., Disp: , Rfl:  .  FLUARIX QUADRIVALENT 0.5 ML injection, Inject 0.5 mLs into the muscle once., Disp: , Rfl: 0 .  fluticasone (FLONASE) 50 MCG/ACT nasal spray, Place 2 sprays into both nostrils daily., Disp: 16 g, Rfl: 6 .  sildenafil (REVATIO) 20 MG tablet, Take 20 mg by mouth as needed., Disp: , Rfl:  .  SYRINGE-NEEDLE, DISP, 3 ML (B-D 3CC LUER-LOK SYR 23GX1") 23G  X 1" 3 ML MISC, Inject 23 g into the skin as directed., Disp: , Rfl:  .  ZOSTAVAX 91478 UNT/0.65ML injection, Inject 0.5 mLs into the muscle once., Disp: , Rfl: 0  No Active Allergies   Review of Systems  Constitutional: Negative for chills, fever, malaise/fatigue and weight loss.  HENT: Positive for congestion and sore throat. Negative for hearing loss.   Eyes: Negative for blurred vision and double vision.  Respiratory: Positive for cough. Negative for shortness of breath and wheezing.   Cardiovascular: Negative for chest pain, palpitations and leg swelling.  Gastrointestinal: Negative for blood in stool, diarrhea and heartburn.  Genitourinary: Negative for dysuria, frequency and urgency.  Skin: Negative for rash.  Neurological: Negative for dizziness, tremors, weakness and headaches.      Objective  Vitals:   12/09/15  1113  BP: 121/78  Pulse: 82  Resp: 16  Temp: 97.8 F (36.6 C)  TempSrc: Oral  Weight: 222 lb 9.6 oz (101 kg)  Height: 6' (1.829 m)    Physical Exam  Constitutional: He is oriented to person, place, and time and well-developed, well-nourished, and in no distress. No distress.  HENT:  Head: Normocephalic and atraumatic.  Right Ear: External ear normal.  Left Ear: External ear normal.  Nose: Mucosal edema and rhinorrhea present. Right sinus exhibits no maxillary sinus tenderness and no frontal sinus tenderness. Left sinus exhibits no maxillary sinus tenderness and no frontal sinus tenderness.  Mouth/Throat: No posterior oropharyngeal edema or posterior oropharyngeal erythema.  Eyes: Conjunctivae and EOM are normal. Pupils are equal, round, and reactive to light. No scleral icterus.  Neck: Normal range of motion. Neck supple. No thyromegaly present.  Cardiovascular: Normal rate, regular rhythm and normal heart sounds.  Exam reveals no gallop and no friction rub.   No murmur heard. Pulmonary/Chest: Effort normal and breath sounds normal. No respiratory distress. He has no wheezes. He has no rales.  Musculoskeletal: He exhibits no edema.  Lymphadenopathy:    He has no cervical adenopathy.  Neurological: He is alert and oriented to person, place, and time.       No results found for this or any previous visit (from the past 2160 hour(s)).   Assessment & Plan  Problem List Items Addressed This Visit      Cardiovascular and Mediastinum   Essential (primary) hypertension - Primary     Respiratory   Seasonal allergies   Relevant Medications   benzonatate (TESSALON) 100 MG capsule   fluticasone (FLONASE) 50 MCG/ACT nasal spray     Other   Anxiety    Other Visit Diagnoses   None.     Meds ordered this encounter  Medications  . benzonatate (TESSALON) 100 MG capsule    Sig: Take 1 capsule (100 mg total) by mouth 2 (two) times daily as needed for cough.    Dispense:  20  capsule    Refill:  0  . fluticasone (FLONASE) 50 MCG/ACT nasal spray    Sig: Place 2 sprays into both nostrils daily.    Dispense:  16 g    Refill:  6   1. Essential (primary) hypertension Cont meds  2. Seasonal allergies Cont Zyrtec or Allegra - benzonatate (TESSALON) 100 MG capsule; Take 1 capsule (100 mg total) by mouth 2 (two) times daily as needed for cough.  Dispense: 20 capsule; Refill: 0 - fluticasone (FLONASE) 50 MCG/ACT nasal spray; Place 2 sprays into both nostrils daily.  Dispense: 16 g; Refill: 6  3. Anxiety Cont  meds

## 2016-04-08 ENCOUNTER — Other Ambulatory Visit: Payer: Self-pay | Admitting: Family Medicine

## 2016-04-08 DIAGNOSIS — Z8669 Personal history of other diseases of the nervous system and sense organs: Secondary | ICD-10-CM

## 2016-04-13 ENCOUNTER — Encounter: Payer: Self-pay | Admitting: Family Medicine

## 2016-04-13 ENCOUNTER — Ambulatory Visit (INDEPENDENT_AMBULATORY_CARE_PROVIDER_SITE_OTHER): Payer: Managed Care, Other (non HMO) | Admitting: Family Medicine

## 2016-04-13 VITALS — BP 132/86 | HR 83 | Temp 97.9°F | Resp 16 | Ht 72.0 in | Wt 213.6 lb

## 2016-04-13 DIAGNOSIS — F419 Anxiety disorder, unspecified: Secondary | ICD-10-CM

## 2016-04-13 DIAGNOSIS — I10 Essential (primary) hypertension: Secondary | ICD-10-CM

## 2016-04-13 DIAGNOSIS — K219 Gastro-esophageal reflux disease without esophagitis: Secondary | ICD-10-CM | POA: Diagnosis not present

## 2016-04-13 DIAGNOSIS — R739 Hyperglycemia, unspecified: Secondary | ICD-10-CM

## 2016-04-13 DIAGNOSIS — E785 Hyperlipidemia, unspecified: Secondary | ICD-10-CM | POA: Diagnosis not present

## 2016-04-13 NOTE — Progress Notes (Signed)
Name: Chris Daugherty   MRN: [id number]    DOB: 02-09-1956   Date:04/13/2016       Progress Note  Subjective  Chief Complaint  Chief Complaint  Patient presents with  . Hypertension    HPI Here for f/u of HBP.  He still has migraines, but less ofter than before.    No problem-specific Assessment & Plan notes found for this encounter.   Past Medical History:  Diagnosis Date  . Allergy   . Depression   . Hypertension     History reviewed. No pertinent surgical history.  Family History  Problem Relation Age of Onset  . Cancer Maternal Grandfather 24    stomaCH    Social History   Social History  . Marital status: Married    Spouse name: N/A  . Number of children: N/A  . Years of education: N/A   Occupational History  . Not on file.   Social History Main Topics  . Smoking status: Former Smoker    Quit date: 11/13/1974  . Smokeless tobacco: Never Used  . Alcohol use No  . Drug use: No  . Sexual activity: Not on file   Other Topics Concern  . Not on file   Social History Narrative  . No narrative on file     Current Outpatient Prescriptions:  .  amLODipine (NORVASC) 10 MG tablet, Take 1 tablet (10 mg total) by mouth daily., Disp: 90 tablet, Rfl: 3 .  BOOSTRIX 5-2.5-18.5 injection, Inject 0.5 mLs into the muscle once., Disp: , Rfl: 0 .  fenofibrate (TRICOR) 145 MG tablet, Take 1 tablet (145 mg total) by mouth daily., Disp: 90 tablet, Rfl: 3 .  fexofenadine (ALLEGRA) 180 MG tablet, Take 1 tablet by mouth 2 (two) times daily as needed., Disp: , Rfl:  .  FLUARIX QUADRIVALENT 0.5 ML injection, Inject 0.5 mLs into the muscle once., Disp: , Rfl: 0 .  fluticasone (FLONASE) 50 MCG/ACT nasal spray, Place 2 sprays into both nostrils daily. (Patient taking differently: Place 2 sprays into both nostrils daily as needed. ), Disp: 16 g, Rfl: 6 .  losartan (COZAAR) 100 MG tablet, Take 1 tablet (100 mg total) by mouth daily., Disp: 90 tablet, Rfl: 3 .  methocarbamol (ROBAXIN)  750 MG tablet, Take 1 tablet twice a day. (Patient taking differently: Take 750 mg by mouth every 8 (eight) hours as needed. Take 1 tablet twice a day.), Disp: 180 tablet, Rfl: 3 .  omeprazole (PRILOSEC) 20 MG capsule, Take 1 capsule (20 mg total) by mouth daily., Disp: 90 capsule, Rfl: 3 .  promethazine (PHENERGAN) 25 MG tablet, TAKE 1 TABLET BY MOUTH  EVERY 6 HOURS AS NEEDED FOR NAUSEA OR VOMITING., Disp: 270 tablet, Rfl: 0 .  sertraline (ZOLOFT) 100 MG tablet, Take 1 tablet (100 mg total) by mouth at bedtime., Disp: 90 tablet, Rfl: 3 .  SUMAtriptan (IMITREX) 100 MG tablet, Take 1 tablet (100 mg total) by mouth once., Disp: 27 tablet, Rfl: 3 .  triamcinolone cream (KENALOG) 0.1 %, Apply 1 application topically once a week., Disp: , Rfl: 0 .  Vitamin D, Cholecalciferol, 400 UNITS TABS, Take by mouth., Disp: , Rfl:  .  ZOSTAVAX 16109 UNT/0.65ML injection, Inject 0.5 mLs into the muscle once., Disp: , Rfl: 0 .  benzonatate (TESSALON) 100 MG capsule, Take 1 capsule (100 mg total) by mouth 2 (two) times daily as needed for cough. (Patient not taking: Reported on 04/13/2016), Disp: 20 capsule, Rfl: 0 .  sildenafil (REVATIO) 20  MG tablet, Take 20 mg by mouth as needed., Disp: , Rfl:   No Active Allergies   Review of Systems  Constitutional: Negative for chills, fever, malaise/fatigue and weight loss.  HENT: Negative for hearing loss and tinnitus.   Eyes: Negative for blurred vision and double vision.  Respiratory: Negative for cough, shortness of breath and wheezing.   Cardiovascular: Negative for chest pain, palpitations and leg swelling.  Gastrointestinal: Negative for abdominal pain, blood in stool and nausea.  Genitourinary: Negative for dysuria, frequency and urgency.  Musculoskeletal: Negative for joint pain, myalgias and neck pain.  Skin: Negative for rash.  Neurological: Negative for dizziness, tingling, tremors, weakness and headaches.      Objective  Vitals:   04/13/16 0951  BP:  132/86  Pulse: 83  Resp: 16  Temp: 97.9 F (36.6 C)  TempSrc: Oral  Weight: 213 lb 9.6 oz (96.9 kg)  Height: 6' (1.829 m)    Physical Exam  Constitutional: He is oriented to person, place, and time and well-developed, well-nourished, and in no distress. No distress.  HENT:  Head: Normocephalic and atraumatic.  Eyes: Conjunctivae and EOM are normal. Pupils are equal, round, and reactive to light. No scleral icterus.  Neck: Normal range of motion. Neck supple. Carotid bruit is not present. No thyromegaly present.  Cardiovascular: Normal rate, regular rhythm and normal heart sounds.  Exam reveals no gallop and no friction rub.   No murmur heard. Pulmonary/Chest: Effort normal and breath sounds normal. No respiratory distress. He has no wheezes. He has no rales.  Musculoskeletal: He exhibits no edema.  Lymphadenopathy:    He has no cervical adenopathy.  Neurological: He is alert and oriented to person, place, and time.  Vitals reviewed.      No results found for this or any previous visit (from the past 2160 hour(s)).   Assessment & Plan  Problem List Items Addressed This Visit      Cardiovascular and Mediastinum   Essential (primary) hypertension - Primary   Relevant Orders   Comprehensive Metabolic Panel (CMET)     Digestive   Acid reflux   Relevant Orders   CBC with Differential     Other   Anxiety   Hyperlipidemia   Relevant Orders   Lipid Profile   Elevated blood sugar   Relevant Orders   HgB A1c      No orders of the defined types were placed in this encounter.  1. Essential (primary) hypertension Cont Amlodipine and Losatan - Comprehensive Metabolic Panel (CMET)  2. Gastroesophageal reflux disease without esophagitis Cont Omeprazole - CBC with Differential  3. Anxiety Cont Zoloft  4. Hyperlipidemia, unspecified hyperlipidemia type Cont Fenfibrate - Lipid Profile  5. Elevated blood sugar Discussed diet and exercise. - HgB A1c

## 2016-04-22 LAB — COMPREHENSIVE METABOLIC PANEL
ALT: 24 IU/L (ref 0–44)
AST: 23 IU/L (ref 0–40)
Albumin/Globulin Ratio: 2.5 — ABNORMAL HIGH (ref 1.2–2.2)
Albumin: 5 g/dL — ABNORMAL HIGH (ref 3.6–4.8)
Alkaline Phosphatase: 50 IU/L (ref 39–117)
BILIRUBIN TOTAL: 0.4 mg/dL (ref 0.0–1.2)
BUN/Creatinine Ratio: 19 (ref 10–24)
BUN: 23 mg/dL (ref 8–27)
CALCIUM: 9.8 mg/dL (ref 8.6–10.2)
CHLORIDE: 98 mmol/L (ref 96–106)
CO2: 27 mmol/L (ref 18–29)
Creatinine, Ser: 1.23 mg/dL (ref 0.76–1.27)
GFR calc Af Amer: 73 mL/min/{1.73_m2} (ref >59)
GFR calc non Af Amer: 63 mL/min/{1.73_m2} (ref >59)
GLUCOSE: 87 mg/dL (ref 65–99)
Globulin, Total: 2 g/dL (ref 1.5–4.5)
POTASSIUM: 4.9 mmol/L (ref 3.5–5.2)
Sodium: 138 mmol/L (ref 134–144)
Total Protein: 7 g/dL (ref 6.0–8.5)

## 2016-04-22 LAB — LIPID PANEL
CHOLESTEROL TOTAL: 182 mg/dL (ref 100–199)
Chol/HDL Ratio: 4.9 ratio units (ref 0.0–5.0)
HDL: 37 mg/dL — AB (ref >39)
LDL Calculated: 104 mg/dL — ABNORMAL HIGH (ref 0–99)
TRIGLYCERIDES: 204 mg/dL — AB (ref 0–149)
VLDL CHOLESTEROL CAL: 41 mg/dL — AB (ref 5–40)

## 2016-04-22 LAB — CBC WITH DIFFERENTIAL/PLATELET
BASOS ABS: 0 10*3/uL (ref 0.0–0.2)
Basos: 1 %
EOS (ABSOLUTE): 0.2 10*3/uL (ref 0.0–0.4)
Eos: 5 %
Hematocrit: 41.4 % (ref 37.5–51.0)
Hemoglobin: 13.9 g/dL (ref 13.0–17.7)
IMMATURE GRANS (ABS): 0 10*3/uL (ref 0.0–0.1)
IMMATURE GRANULOCYTES: 0 %
LYMPHS: 29 %
Lymphocytes Absolute: 1.2 10*3/uL (ref 0.7–3.1)
MCH: 28.3 pg (ref 26.6–33.0)
MCHC: 33.6 g/dL (ref 31.5–35.7)
MCV: 84 fL (ref 79–97)
MONOCYTES: 8 %
Monocytes Absolute: 0.3 10*3/uL (ref 0.1–0.9)
NEUTROS PCT: 57 %
Neutrophils Absolute: 2.4 10*3/uL (ref 1.4–7.0)
PLATELETS: 217 10*3/uL (ref 150–379)
RBC: 4.91 x10E6/uL (ref 4.14–5.80)
RDW: 13.7 % (ref 12.3–15.4)
WBC: 4.2 10*3/uL (ref 3.4–10.8)

## 2016-04-22 LAB — HEMOGLOBIN A1C
ESTIMATED AVERAGE GLUCOSE: 97 mg/dL
Hgb A1c MFr Bld: 5 % (ref 4.8–5.6)

## 2016-06-20 DIAGNOSIS — I129 Hypertensive chronic kidney disease with stage 1 through stage 4 chronic kidney disease, or unspecified chronic kidney disease: Secondary | ICD-10-CM | POA: Insufficient documentation

## 2016-06-20 DIAGNOSIS — J309 Allergic rhinitis, unspecified: Secondary | ICD-10-CM | POA: Insufficient documentation

## 2016-06-20 DIAGNOSIS — I10 Essential (primary) hypertension: Secondary | ICD-10-CM | POA: Insufficient documentation

## 2016-06-22 ENCOUNTER — Telehealth: Payer: Self-pay | Admitting: Family Medicine

## 2016-06-22 NOTE — Telephone Encounter (Signed)
Routing to provider  

## 2016-06-22 NOTE — Telephone Encounter (Signed)
New patient that transferred from Dr Larene Beach when he retired, he has an appt with Malachy Mood on 4/27 but will need some of his meds refiled prior to that.  He wanted to know if Malachy Mood will take care of these prior to his visit. I told him I did not know if she could do that but I would check with her.  Please advise. Thanks

## 2016-06-23 NOTE — Telephone Encounter (Signed)
Routing to provider. FYI.  

## 2016-06-23 NOTE — Telephone Encounter (Signed)
Please ask Alwyn Ren if we have a policy on this.  I'll do whatever is necessary according to policy

## 2016-06-23 NOTE — Telephone Encounter (Signed)
Patient's should have their previous provider refill all medications until they establish care with one of our providers.    Routing to all of you as an FYI. :)  Thanks

## 2016-06-23 NOTE — Telephone Encounter (Signed)
Rescheduled the appt for patient to 07/12/2016 @ 1.  Explained to the patient the question regarding medication refills.

## 2016-06-23 NOTE — Telephone Encounter (Signed)
Routing to [employer]

## 2016-06-26 NOTE — Telephone Encounter (Signed)
Chris Daugherty, please call and let patient know that Alwyn Ren said. Another provider at his previous office should still be filling his medications until we see the patient.

## 2016-06-26 NOTE — Telephone Encounter (Signed)
Yes, the retiring provider's office should have a provider at their office to handle these requests. The only exception usually is if an office has closed completely. I hope this makes sense.Marland KitchenMarland Kitchen

## 2016-06-26 NOTE — Telephone Encounter (Signed)
Is this still the policy if the patient's previous provider has retired?

## 2016-06-26 NOTE — Telephone Encounter (Signed)
Explained this to the patient. He understood the process

## 2016-06-27 NOTE — Telephone Encounter (Signed)
Chris Daugherty, I cannot close this encounter because it says you have an unfinished note.

## 2016-07-05 ENCOUNTER — Other Ambulatory Visit: Payer: Self-pay | Admitting: Family Medicine

## 2016-07-05 DIAGNOSIS — F419 Anxiety disorder, unspecified: Secondary | ICD-10-CM

## 2016-07-05 DIAGNOSIS — K219 Gastro-esophageal reflux disease without esophagitis: Secondary | ICD-10-CM

## 2016-07-05 DIAGNOSIS — E785 Hyperlipidemia, unspecified: Secondary | ICD-10-CM

## 2016-07-05 DIAGNOSIS — I10 Essential (primary) hypertension: Secondary | ICD-10-CM

## 2016-07-05 MED ORDER — AMLODIPINE BESYLATE 10 MG PO TABS: 10.0000 mg | ORAL_TABLET | Freq: Every day | ORAL | 0 refills | Status: DC

## 2016-07-05 MED ORDER — LOSARTAN POTASSIUM 100 MG PO TABS: 100.0000 mg | ORAL_TABLET | Freq: Every day | ORAL | 0 refills | Status: DC

## 2016-07-05 MED ORDER — SERTRALINE HCL 100 MG PO TABS: 100.0000 mg | ORAL_TABLET | Freq: Every day | ORAL | 0 refills | Status: DC

## 2016-07-05 MED ORDER — OMEPRAZOLE 20 MG PO CPDR: 20.0000 mg | DELAYED_RELEASE_CAPSULE | Freq: Every day | ORAL | 0 refills | Status: DC

## 2016-07-05 MED ORDER — FENOFIBRATE 145 MG PO TABS: 145.0000 mg | ORAL_TABLET | Freq: Every day | ORAL | 0 refills | Status: DC

## 2016-07-05 NOTE — Telephone Encounter (Signed)
Temporary 90 day supply now that patient is to establish with new PCP [facility]

## 2016-07-12 ENCOUNTER — Ambulatory Visit (INDEPENDENT_AMBULATORY_CARE_PROVIDER_SITE_OTHER): Payer: Managed Care, Other (non HMO) | Admitting: Unknown Physician Specialty

## 2016-07-12 ENCOUNTER — Encounter: Payer: Self-pay | Admitting: Unknown Physician Specialty

## 2016-07-12 VITALS — BP 133/83 | HR 83 | Temp 98.2°F | Ht 72.0 in | Wt 214.9 lb

## 2016-07-12 DIAGNOSIS — E785 Hyperlipidemia, unspecified: Secondary | ICD-10-CM

## 2016-07-12 DIAGNOSIS — J301 Allergic rhinitis due to pollen: Secondary | ICD-10-CM

## 2016-07-12 DIAGNOSIS — Z8669 Personal history of other diseases of the nervous system and sense organs: Secondary | ICD-10-CM

## 2016-07-12 DIAGNOSIS — F5101 Primary insomnia: Secondary | ICD-10-CM

## 2016-07-12 DIAGNOSIS — I10 Essential (primary) hypertension: Secondary | ICD-10-CM

## 2016-07-12 DIAGNOSIS — F419 Anxiety disorder, unspecified: Secondary | ICD-10-CM

## 2016-07-12 DIAGNOSIS — Z7689 Persons encountering health services in other specified circumstances: Secondary | ICD-10-CM | POA: Diagnosis not present

## 2016-07-12 MED ORDER — MONTELUKAST SODIUM 10 MG PO TABS: 10.0000 mg | ORAL_TABLET | Freq: Every day | ORAL | 3 refills | Status: DC

## 2016-07-12 MED ORDER — METHOCARBAMOL 750 MG PO TABS: ORAL_TABLET | ORAL | 3 refills | Status: DC

## 2016-07-12 MED ORDER — ZOLPIDEM TARTRATE 5 MG PO TABS: 5.0000 mg | ORAL_TABLET | Freq: Every evening | ORAL | 0 refills | Status: DC | PRN

## 2016-07-12 MED ORDER — PROMETHAZINE HCL 25 MG PO TABS: 25.0000 mg | ORAL_TABLET | Freq: Three times a day (TID) | ORAL | 0 refills | Status: DC | PRN

## 2016-07-12 MED ORDER — SUMATRIPTAN SUCCINATE 100 MG PO TABS: 100.0000 mg | ORAL_TABLET | Freq: Once | ORAL | 3 refills | Status: DC

## 2016-07-12 MED ORDER — FLUTICASONE PROPIONATE 50 MCG/ACT NA SUSP: 2.0000 | Freq: Every day | NASAL | 6 refills | Status: DC

## 2016-07-12 NOTE — Assessment & Plan Note (Signed)
Stable on current medications.  Will refill

## 2016-07-12 NOTE — Patient Instructions (Addendum)
CBT for sleep [email or web address]  Insomnia Insomnia is a sleep disorder that makes it difficult to fall asleep or to stay asleep. Insomnia can cause tiredness (fatigue), low energy, difficulty concentrating, mood swings, and poor performance at work or school. There are three different ways to classify insomnia:  Difficulty falling asleep.  Difficulty staying asleep.  Waking up too early in the morning. Any type of insomnia can be long-term (chronic) or short-term (acute). Both are common. Short-term insomnia usually lasts for three months or less. Chronic insomnia occurs at least three times a week for longer than three months. What are the causes? Insomnia may be caused by another condition, situation, or substance, such as:  Anxiety.  Certain medicines.  Gastroesophageal reflux disease (GERD) or other gastrointestinal conditions.  Asthma or other breathing conditions.  Restless legs syndrome, sleep apnea, or other sleep disorders.  Chronic pain.  Menopause. This may include hot flashes.  Stroke.  Abuse of alcohol, tobacco, or illegal drugs.  Depression.  Caffeine.  Neurological disorders, such as Alzheimer disease.  An overactive thyroid (hyperthyroidism). The cause of insomnia may not be known. What increases the risk? Risk factors for insomnia include:  Gender. Women are more commonly affected than men.  Age. Insomnia is more common as you get older.  Stress. This may involve your professional or personal life.  Income. Insomnia is more common in people with lower income.  Lack of exercise.  Irregular work schedule or night shifts.  Traveling between different time zones. What are the signs or symptoms? If you have insomnia, trouble falling asleep or trouble staying asleep is the main symptom. This may lead to other symptoms, such as:  Feeling fatigued.  Feeling nervous about going to sleep.  Not feeling rested in the morning.  Having  trouble concentrating.  Feeling irritable, anxious, or depressed. How is this treated? Treatment for insomnia depends on the cause. If your insomnia is caused by an underlying condition, treatment will focus on addressing the condition. Treatment may also include:  Medicines to help you sleep.  Counseling or therapy.  Lifestyle adjustments. Follow these instructions at home:  Take medicines only as directed by your health care provider.  Keep regular sleeping and waking hours. Avoid naps.  Keep a sleep diary to help you and your health care provider figure out what could be causing your insomnia. Include:  When you sleep.  When you wake up during the night.  How well you sleep.  How rested you feel the next day.  Any side effects of medicines you are taking.  What you eat and drink.  Make your bedroom a comfortable place where it is easy to fall asleep:  Put up shades or special blackout curtains to block light from outside.  Use a white noise machine to block noise.  Keep the temperature cool.  Exercise regularly as directed by your health care provider. Avoid exercising right before bedtime.  Use relaxation techniques to manage stress. Ask your health care provider to suggest some techniques that may work well for you. These may include:  Breathing exercises.  Routines to release muscle tension.  Visualizing peaceful scenes.  Cut back on alcohol, caffeinated beverages, and cigarettes, especially close to bedtime. These can disrupt your sleep.  Do not overeat or eat spicy foods right before bedtime. This can lead to digestive discomfort that can make it hard for you to sleep.  Limit screen use before bedtime. This includes:  Watching TV.  Using your smartphone, tablet,  and computer.  Stick to a routine. This can help you fall asleep faster. Try to do a quiet activity, brush your teeth, and go to bed at the same time each night.  Get out of bed if you are  still awake after 15 minutes of trying to sleep. Keep the lights down, but try reading or doing a quiet activity. When you feel sleepy, go back to bed.  Make sure that you drive carefully. Avoid driving if you feel very sleepy.  Keep all follow-up appointments as directed by your health care provider. This is important. Contact a health care provider if:  You are tired throughout the day or have trouble in your daily routine due to sleepiness.  You continue to have sleep problems or your sleep problems get worse. Get help right away if:  You have serious thoughts about hurting yourself or someone else. This information is not intended to replace advice given to you by your health care provider. Make sure you discuss any questions you have with your health care provider. Document Released: 03/17/2000 Document Revised: 08/20/2015 Document Reviewed: 12/19/2013 Elsevier Interactive Patient Education  2017 [employer]. Insomnia Insomnia is a sleep disorder that makes it difficult to fall asleep or to stay asleep. Insomnia can cause tiredness (fatigue), low energy, difficulty concentrating, mood swings, and poor performance at work or school. There are three different ways to classify insomnia:  Difficulty falling asleep.  Difficulty staying asleep.  Waking up too early in the morning. Any type of insomnia can be long-term (chronic) or short-term (acute). Both are common. Short-term insomnia usually lasts for three months or less. Chronic insomnia occurs at least three times a week for longer than three months. What are the causes? Insomnia may be caused by another condition, situation, or substance, such as:  Anxiety.  Certain medicines.  Gastroesophageal reflux disease (GERD) or other gastrointestinal conditions.  Asthma or other breathing conditions.  Restless legs syndrome, sleep apnea, or other sleep disorders.  Chronic pain.  Menopause. This may include hot  flashes.  Stroke.  Abuse of alcohol, tobacco, or illegal drugs.  Depression.  Caffeine.  Neurological disorders, such as Alzheimer disease.  An overactive thyroid (hyperthyroidism). The cause of insomnia may not be known. What increases the risk? Risk factors for insomnia include:  Gender. Women are more commonly affected than men.  Age. Insomnia is more common as you get older.  Stress. This may involve your professional or personal life.  Income. Insomnia is more common in people with lower income.  Lack of exercise.  Irregular work schedule or night shifts.  Traveling between different time zones. What are the signs or symptoms? If you have insomnia, trouble falling asleep or trouble staying asleep is the main symptom. This may lead to other symptoms, such as:  Feeling fatigued.  Feeling nervous about going to sleep.  Not feeling rested in the morning.  Having trouble concentrating.  Feeling irritable, anxious, or depressed. How is this treated? Treatment for insomnia depends on the cause. If your insomnia is caused by an underlying condition, treatment will focus on addressing the condition. Treatment may also include:  Medicines to help you sleep.  Counseling or therapy.  Lifestyle adjustments. Follow these instructions at home:  Take medicines only as directed by your health care provider.  Keep regular sleeping and waking hours. Avoid naps.  Keep a sleep diary to help you and your health care provider figure out what could be causing your insomnia. Include:  When  you sleep.  When you wake up during the night.  How well you sleep.  How rested you feel the next day.  Any side effects of medicines you are taking.  What you eat and drink.  Make your bedroom a comfortable place where it is easy to fall asleep:  Put up shades or special blackout curtains to block light from outside.  Use a white noise machine to block noise.  Keep the  temperature cool.  Exercise regularly as directed by your health care provider. Avoid exercising right before bedtime.  Use relaxation techniques to manage stress. Ask your health care provider to suggest some techniques that may work well for you. These may include:  Breathing exercises.  Routines to release muscle tension.  Visualizing peaceful scenes.  Cut back on alcohol, caffeinated beverages, and cigarettes, especially close to bedtime. These can disrupt your sleep.  Do not overeat or eat spicy foods right before bedtime. This can lead to digestive discomfort that can make it hard for you to sleep.  Limit screen use before bedtime. This includes:  Watching TV.  Using your smartphone, tablet, and computer.  Stick to a routine. This can help you fall asleep faster. Try to do a quiet activity, brush your teeth, and go to bed at the same time each night.  Get out of bed if you are still awake after 15 minutes of trying to sleep. Keep the lights down, but try reading or doing a quiet activity. When you feel sleepy, go back to bed.  Make sure that you drive carefully. Avoid driving if you feel very sleepy.  Keep all follow-up appointments as directed by your health care provider. This is important. Contact a health care provider if:  You are tired throughout the day or have trouble in your daily routine due to sleepiness.  You continue to have sleep problems or your sleep problems get worse. Get help right away if:  You have serious thoughts about hurting yourself or someone else. This information is not intended to replace advice given to you by your health care provider. Make sure you discuss any questions you have with your health care provider. Document Released: 03/17/2000 Document Revised: 08/20/2015 Document Reviewed: 12/19/2013 Elsevier Interactive Patient Education  2017 [employer].

## 2016-07-12 NOTE — Assessment & Plan Note (Signed)
Stable, continue present medications.   

## 2016-07-12 NOTE — Assessment & Plan Note (Signed)
Discussed CBT for sleep. OK for occasional use of Ambien.

## 2016-07-12 NOTE — Progress Notes (Signed)
BP 133/83 (BP Location: Left Arm, Patient Position: Sitting, Cuff Size: Large)   Pulse 83   Temp 98.2 F (36.8 C)   Ht 6' (1.829 m)   Wt 214 lb 14.4 oz (97.5 kg)   SpO2 98%   BMI 29.15 kg/m    Subjective:    Patient ID: Chris Daugherty, male    DOB: Mar 08, 1956, 61 y.o.   MRN: [id number]  HPI: Chris Daugherty is a 61 y.o. male  Chief Complaint  Patient presents with  . Establish Care   Migraine Pt has migraines and taking a combination of medications of Methocarbinol, Imitrex, and Phenergan.  Takes Methocarbamol daily.  Last Promethazine and Imitrex at the same time.  Recently took but first time in a couple of months.     Hypertension Using medications without difficulty Average home BPs Not currently checking   No problems or lightheadedness No chest pain with exertion or shortness of breath No Edema   Hyperlipidemia Using medications without problems: No Muscle aches  Diet compliance: Tries to stay away from greasy foods Exercise:Walking dogs.    Anxiety Mild anxiety.  No panic since young.  Taking Zoloft daily for about 10 years Depression screen [facility] 2/9 07/12/2016 07/12/2016 03/19/2015 11/13/2014  Decreased Interest 0 0 0 0  Down, Depressed, Hopeless 0 0 0 0  PHQ - 2 Score 0 0 0 0  Altered sleeping 1 1 - -  Tired, decreased energy 1 0 - -  Change in appetite 1 0 - -  Feeling bad or failure about yourself  0 0 - -  Trouble concentrating 0 0 - -  Moving slowly or fidgety/restless 0 0 - -  Suicidal thoughts 0 0 - -  PHQ-9 Score 3 1 - -   Allergic rhinnitis Tried Zyrtec, Allegra, Flonase and nothing seems to help.  Gets itchy eyes and throat.    Insomnia Pt states he has trouble sleeping that comes and goes.  Has taken the occasional sleep medications.      Social History   Social History  . Marital status: Married    Spouse name: N/A  . Number of children: N/A  . Years of education: N/A   Occupational History  . Not on file.   Social History Main  Topics  . Smoking status: Former Smoker    Quit date: 11/13/1974  . Smokeless tobacco: Never Used  . Alcohol use No  . Drug use: No  . Sexual activity: Yes   Other Topics Concern  . Not on file   Social History Narrative  . No narrative on file   Family History  Problem Relation Age of Onset  . Cancer Maternal Grandfather 36    stomaCH  . Hypertension Mother    Past Medical History:  Diagnosis Date  . Acid reflux   . Allergic rhinitis   . Allergy   . Anxiety   . Depression   . Hyperlipidemia   . Hypertension   . Testicular hypofunction    Past Surgical History:  Procedure Laterality Date  . CHOLECYSTECTOMY    . COLONOSCOPY  04/30/2014  . GASTRIC FUNDOPLICATION    . INGUINAL HERNIA REPAIR    . PARTIAL NEPHRECTOMY Left   . VASECTOMY      Relevant past medical, surgical, family and social history reviewed and updated as indicated. Interim medical history since our last visit reviewed. Allergies and medications reviewed and updated.  Review of Systems  Constitutional: Negative.   HENT: Positive for congestion,  rhinorrhea and voice change.   Eyes: Negative.   Respiratory: Negative.   Cardiovascular: Negative.   Gastrointestinal: Negative.   Endocrine: Negative.   Genitourinary: Negative.   Skin: Negative.   Allergic/Immunologic: Negative.   Neurological: Negative.   Hematological: Negative.   Psychiatric/Behavioral: Negative.     Per HPI unless specifically indicated above     Objective:    BP 133/83 (BP Location: Left Arm, Patient Position: Sitting, Cuff Size: Large)   Pulse 83   Temp 98.2 F (36.8 C)   Ht 6' (1.829 m)   Wt 214 lb 14.4 oz (97.5 kg)   SpO2 98%   BMI 29.15 kg/m   Wt Readings from Last 3 Encounters:  07/12/16 214 lb 14.4 oz (97.5 kg)  04/13/16 213 lb 9.6 oz (96.9 kg)  12/09/15 222 lb 9.6 oz (101 kg)    Physical Exam  Constitutional: He is oriented to person, place, and time. He appears well-developed and well-nourished. No  distress.  HENT:  Head: Normocephalic and atraumatic.  Eyes: Conjunctivae and lids are normal. Right eye exhibits no discharge. Left eye exhibits no discharge. No scleral icterus.  Neck: Normal range of motion. Neck supple. No JVD present. Carotid bruit is not present.  Cardiovascular: Normal rate, regular rhythm and normal heart sounds.   Pulmonary/Chest: Effort normal and breath sounds normal. No respiratory distress.  Abdominal: Normal appearance. There is no splenomegaly or hepatomegaly.  Musculoskeletal: Normal range of motion.  Neurological: He is alert and oriented to person, place, and time.  Skin: Skin is warm, dry and intact. No rash noted. No pallor.  Psychiatric: He has a normal mood and affect. His behavior is normal. Judgment and thought content normal.    Results for orders placed or performed in visit on 06/20/16  HM COLONOSCOPY  Result Value Ref Range   HM Colonoscopy Patient Reported See Report (in chart), Patient Reported      Assessment & Plan:   Problem List Items Addressed This Visit      Unprioritized   Anxiety    Stable, continue present medications.        Hx of migraine headaches    Stable on current medications.  Will refill      Relevant Medications   SUMAtriptan (IMITREX) 100 MG tablet   promethazine (PHENERGAN) 25 MG tablet   methocarbamol (ROBAXIN) 750 MG tablet   Hyperlipidemia    Stable in review of past labs, continue present medications.        Hypertension    Stable, continue present medications.        Insomnia    Discussed CBT for sleep. OK for occasional use of Ambien.        Seasonal allergies    Uncontrolled.  Try Singulair and flonase       Other Visit Diagnoses    Encounter to establish care    -  Primary      Reviewed labs.  Last done in January  Follow up plan: Return in about 6 months (around 01/11/2017) for physical.

## 2016-07-12 NOTE — Assessment & Plan Note (Signed)
Stable in review of past labs, continue present medications.

## 2016-07-12 NOTE — Assessment & Plan Note (Addendum)
Uncontrolled.  Try Singulair and flonase

## 2016-07-14 ENCOUNTER — Telehealth: Payer: Self-pay

## 2016-07-14 NOTE — Telephone Encounter (Signed)
Patient's ambien prescription was printed out. Called to ask patient which pharmacy he needed it sent to, his local pharmacy or mail order. Left patient a VM asking for him to please return my call.

## 2016-07-14 NOTE — Telephone Encounter (Signed)
Prescription faxed to pharmacy.

## 2016-07-14 NOTE — Telephone Encounter (Signed)
Patient called back.  It's [facility] on [address] [place].

## 2016-07-26 ENCOUNTER — Ambulatory Visit: Payer: Managed Care, Other (non HMO) | Admitting: Unknown Physician Specialty

## 2016-07-28 ENCOUNTER — Ambulatory Visit: Payer: Managed Care, Other (non HMO) | Admitting: Unknown Physician Specialty

## 2016-08-11 ENCOUNTER — Ambulatory Visit: Payer: Managed Care, Other (non HMO) | Admitting: Family Medicine

## 2016-09-23 ENCOUNTER — Other Ambulatory Visit: Payer: Self-pay | Admitting: Family Medicine

## 2016-09-23 DIAGNOSIS — I10 Essential (primary) hypertension: Secondary | ICD-10-CM

## 2016-09-23 DIAGNOSIS — K219 Gastro-esophageal reflux disease without esophagitis: Secondary | ICD-10-CM

## 2016-09-23 DIAGNOSIS — E785 Hyperlipidemia, unspecified: Secondary | ICD-10-CM

## 2016-09-23 DIAGNOSIS — F419 Anxiety disorder, unspecified: Secondary | ICD-10-CM

## 2016-09-26 ENCOUNTER — Other Ambulatory Visit: Payer: Self-pay

## 2016-09-26 DIAGNOSIS — I10 Essential (primary) hypertension: Secondary | ICD-10-CM

## 2016-09-26 DIAGNOSIS — E785 Hyperlipidemia, unspecified: Secondary | ICD-10-CM

## 2016-09-26 DIAGNOSIS — K219 Gastro-esophageal reflux disease without esophagitis: Secondary | ICD-10-CM

## 2016-09-26 DIAGNOSIS — F419 Anxiety disorder, unspecified: Secondary | ICD-10-CM

## 2016-09-26 MED ORDER — OMEPRAZOLE 20 MG PO CPDR: 20.0000 mg | DELAYED_RELEASE_CAPSULE | Freq: Every day | ORAL | 0 refills | Status: DC

## 2016-09-26 MED ORDER — LOSARTAN POTASSIUM 100 MG PO TABS: 100.0000 mg | ORAL_TABLET | Freq: Every day | ORAL | 0 refills | Status: DC

## 2016-09-26 MED ORDER — FENOFIBRATE 145 MG PO TABS: 145.0000 mg | ORAL_TABLET | Freq: Every day | ORAL | 0 refills | Status: DC

## 2016-09-26 MED ORDER — SERTRALINE HCL 100 MG PO TABS: 100.0000 mg | ORAL_TABLET | Freq: Every day | ORAL | 0 refills | Status: DC

## 2016-09-26 MED ORDER — AMLODIPINE BESYLATE 10 MG PO TABS: 10.0000 mg | ORAL_TABLET | Freq: Every day | ORAL | 0 refills | Status: DC

## 2016-09-27 ENCOUNTER — Ambulatory Visit (INDEPENDENT_AMBULATORY_CARE_PROVIDER_SITE_OTHER): Payer: Managed Care, Other (non HMO) | Admitting: Unknown Physician Specialty

## 2016-09-27 ENCOUNTER — Encounter: Payer: Self-pay | Admitting: Unknown Physician Specialty

## 2016-09-27 DIAGNOSIS — F419 Anxiety disorder, unspecified: Secondary | ICD-10-CM

## 2016-09-27 MED ORDER — LORAZEPAM 0.5 MG PO TABS: 0.5000 mg | ORAL_TABLET | Freq: Every evening | ORAL | 1 refills | Status: DC | PRN

## 2016-09-27 NOTE — Assessment & Plan Note (Addendum)
Increased anxiety.  Increase Zoloft to 150 mg.    Start exercise program.  Discussed finding a Social worker.  OK for Lorazepam for occasional use

## 2016-09-27 NOTE — Patient Instructions (Signed)
Find a counselor>psychology today

## 2016-09-27 NOTE — Progress Notes (Signed)
BP 128/85   Pulse 84   Temp 98.2 F (36.8 C)   Wt 202 lb 9.6 oz (91.9 kg)   SpO2 98%   BMI 27.48 kg/m    Subjective:    Patient ID: Chris Daugherty, male    DOB: 01-19-1956, 61 y.o.   MRN: [id number]  HPI: Chris Daugherty is a 61 y.o. male  Chief Complaint  Patient presents with  . Medication Problem    pt states he would like to discuss changing the dose of his sertraline   Depression Pt states about 20 years ago had terrible anxiety.  Sertraline helped and taking 100 mg since then.  States he feels 'nervous" on the inside.  He has loss of energy.  States it is almost daily.  States sometimes he takes his wife's anxiety.    Depression screen [facility] 2/9 09/27/2016 07/12/2016 07/12/2016 03/19/2015 11/13/2014  Decreased Interest 2 0 0 0 0  Down, Depressed, Hopeless 2 0 0 0 0  PHQ - 2 Score 4 0 0 0 0  Altered sleeping 2 1 1  - -  Tired, decreased energy 2 1 0 - -  Change in appetite 1 1 0 - -  Feeling bad or failure about yourself  0 0 0 - -  Trouble concentrating 0 0 0 - -  Moving slowly or fidgety/restless 0 0 0 - -  Suicidal thoughts 0 0 0 - -  PHQ-9 Score 9 3 1  - -    Relevant past medical, surgical, family and social history reviewed and updated as indicated. Interim medical history since our last visit reviewed. Allergies and medications reviewed and updated.  Review of Systems  Per HPI unless specifically indicated above     Objective:    BP 128/85   Pulse 84   Temp 98.2 F (36.8 C)   Wt 202 lb 9.6 oz (91.9 kg)   SpO2 98%   BMI 27.48 kg/m   Wt Readings from Last 3 Encounters:  09/27/16 202 lb 9.6 oz (91.9 kg)  07/12/16 214 lb 14.4 oz (97.5 kg)  04/13/16 213 lb 9.6 oz (96.9 kg)    Physical Exam  Constitutional: He is oriented to person, place, and time. He appears well-developed and well-nourished. No distress.  HENT:  Head: Normocephalic and atraumatic.  Eyes: Conjunctivae and lids are normal. Right eye exhibits no discharge. Left eye exhibits no discharge.  No scleral icterus.  Neck: Normal range of motion. Neck supple. No JVD present. Carotid bruit is not present.  Cardiovascular: Normal rate, regular rhythm and normal heart sounds.   Pulmonary/Chest: Effort normal and breath sounds normal. No respiratory distress.  Abdominal: Normal appearance. There is no splenomegaly or hepatomegaly.  Musculoskeletal: Normal range of motion.  Neurological: He is alert and oriented to person, place, and time.  Skin: Skin is warm, dry and intact. No rash noted. No pallor.  Psychiatric: He has a normal mood and affect. His behavior is normal. Judgment and thought content normal.     Assessment & Plan:   Problem List Items Addressed This Visit      Unprioritized   Anxiety    Increased anxiety.  Increase Zoloft to 150 mg.    Start exercise program.  Discussed finding a Social worker.  OK for Lorazepam for occasional use      Relevant Medications   LORazepam (ATIVAN) 0.5 MG tablet   Other Relevant Orders   TSH   VITAMIN D 25 Hydroxy (Vit-D Deficiency, Fractures)   Vitamin B12  Follow up plan: Return in about 4 weeks (around 10/25/2016).

## 2016-09-28 LAB — VITAMIN B12: Vitamin B-12: 410 pg/mL (ref 232–1245)

## 2016-09-28 LAB — VITAMIN D 25 HYDROXY (VIT D DEFICIENCY, FRACTURES): Vit D, 25-Hydroxy: 48.5 ng/mL (ref 30.0–100.0)

## 2016-09-28 LAB — TSH: TSH: 1.92 u[IU]/mL (ref 0.450–4.500)

## 2016-10-27 ENCOUNTER — Encounter: Payer: Self-pay | Admitting: Unknown Physician Specialty

## 2016-10-27 ENCOUNTER — Ambulatory Visit (INDEPENDENT_AMBULATORY_CARE_PROVIDER_SITE_OTHER): Payer: Managed Care, Other (non HMO) | Admitting: Unknown Physician Specialty

## 2016-10-27 DIAGNOSIS — F419 Anxiety disorder, unspecified: Secondary | ICD-10-CM

## 2016-10-27 MED ORDER — SERTRALINE HCL 100 MG PO TABS: 100.0000 mg | ORAL_TABLET | Freq: Every day | ORAL | 1 refills | Status: DC

## 2016-10-27 MED ORDER — LORAZEPAM 0.5 MG PO TABS: 0.5000 mg | ORAL_TABLET | Freq: Every evening | ORAL | 1 refills | Status: DC | PRN

## 2016-10-27 NOTE — Patient Instructions (Signed)
Akathisia is a movement disorder characterized by a feeling of inner restlessness and inability to stay still.

## 2016-10-27 NOTE — Progress Notes (Signed)
   BP 137/84   Pulse 93   Temp 98.4 F (36.9 C)   Wt 202 lb 9.6 oz (91.9 kg)   SpO2 96%   BMI 27.48 kg/m    Subjective:    Patient ID: Chris Daugherty, male    DOB: 1956-03-08, 61 y.o.   MRN: [id number]  HPI: Arsen Mangione is a 61 y.o. male  Chief Complaint  Patient presents with  . Anxiety    4 week f/up   Anxiety States he is doing better.  Last visit we increased to 150 mg.  He is wondering if going up to 200 mg may be helpful.  Taking Lorazepam daily but misses some day.    Depression screen [facility] 2/9 10/27/2016 09/27/2016 07/12/2016 07/12/2016 03/19/2015  Decreased Interest 0 2 0 0 0  Down, Depressed, Hopeless 0 2 0 0 0  PHQ - 2 Score 0 4 0 0 0  Altered sleeping 0 2 1 1  -  Tired, decreased energy 0 2 1 0 -  Change in appetite 0 1 1 0 -  Feeling bad or failure about yourself  0 0 0 0 -  Trouble concentrating 0 0 0 0 -  Moving slowly or fidgety/restless 0 0 0 0 -  Suicidal thoughts 0 0 0 0 -  PHQ-9 Score 0 9 3 1  -     Relevant past medical, surgical, family and social history reviewed and updated as indicated. Interim medical history since our last visit reviewed. Allergies and medications reviewed and updated.  Review of Systems  Per HPI unless specifically indicated above     Objective:    BP 137/84   Pulse 93   Temp 98.4 F (36.9 C)   Wt 202 lb 9.6 oz (91.9 kg)   SpO2 96%   BMI 27.48 kg/m   Wt Readings from Last 3 Encounters:  10/27/16 202 lb 9.6 oz (91.9 kg)  09/27/16 202 lb 9.6 oz (91.9 kg)  07/12/16 214 lb 14.4 oz (97.5 kg)    Physical Exam  Constitutional: He is oriented to person, place, and time. He appears well-developed and well-nourished. No distress.  HENT:  Head: Normocephalic and atraumatic.  Eyes: Conjunctivae and lids are normal. Right eye exhibits no discharge. Left eye exhibits no discharge. No scleral icterus.  Neck: Normal range of motion. Neck supple. No JVD present. Carotid bruit is not present.  Cardiovascular: Normal rate,  regular rhythm and normal heart sounds.   Pulmonary/Chest: Effort normal and breath sounds normal. No respiratory distress.  Abdominal: Normal appearance. There is no splenomegaly or hepatomegaly.  Musculoskeletal: Normal range of motion.  Neurological: He is alert and oriented to person, place, and time.  Skin: Skin is warm, dry and intact. No rash noted. No pallor.  Psychiatric: He has a normal mood and affect. His behavior is normal. Judgment and thought content normal.      Assessment & Plan:   Problem List Items Addressed This Visit      Unprioritized   Anxiety    Doing well with 150 mg of Sertraline.  Increase to 200 mg.  Refill Lorazepam .5 mg prn.  Goal is to not require the Lorazepam.        Relevant Medications   sertraline (ZOLOFT) 100 MG tablet   LORazepam (ATIVAN) 0.5 MG tablet       Follow up plan: Return in about 3 months (around 01/27/2017).

## 2016-10-27 NOTE — Assessment & Plan Note (Signed)
Doing well with 150 mg of Sertraline.  Increase to 200 mg.  Refill Lorazepam .5 mg prn.  Goal is to not require the Lorazepam.

## 2016-11-07 ENCOUNTER — Telehealth: Payer: Self-pay | Admitting: Unknown Physician Specialty

## 2016-11-07 MED ORDER — SERTRALINE HCL 100 MG PO TABS: 200.0000 mg | ORAL_TABLET | Freq: Every day | ORAL | 1 refills | Status: DC

## 2016-11-07 NOTE — Telephone Encounter (Signed)
Called and left patient a VM letting him know that we sent in a new prescription to [facility] for him to take a total of sertraline 200 mg daily (100 mg BID). Asked for patient to please give me a call back if he has any questions.

## 2016-11-07 NOTE — Telephone Encounter (Signed)
Pt called and stated that his zoloft was sent to [facility] for 100mg  and it should be 200mg .

## 2016-11-07 NOTE — Telephone Encounter (Signed)
Routing to provider. Current RX is written for sertraline 100 mg, take 1 tab daily, dispense 180 tablets. Not sure if sertraline come in 200 mg tablets or if the directions were supposed to read take 2 tablets daily. Please advise.

## 2016-11-14 ENCOUNTER — Other Ambulatory Visit: Payer: Self-pay | Admitting: Unknown Physician Specialty

## 2016-11-14 DIAGNOSIS — E785 Hyperlipidemia, unspecified: Secondary | ICD-10-CM

## 2016-11-14 DIAGNOSIS — I10 Essential (primary) hypertension: Secondary | ICD-10-CM

## 2016-11-14 DIAGNOSIS — K219 Gastro-esophageal reflux disease without esophagitis: Secondary | ICD-10-CM

## 2016-11-23 ENCOUNTER — Other Ambulatory Visit: Payer: Self-pay | Admitting: Unknown Physician Specialty

## 2016-11-23 DIAGNOSIS — Z8669 Personal history of other diseases of the nervous system and sense organs: Secondary | ICD-10-CM

## 2017-01-12 ENCOUNTER — Ambulatory Visit: Payer: Managed Care, Other (non HMO) | Admitting: Unknown Physician Specialty

## 2017-02-02 ENCOUNTER — Encounter: Payer: Self-pay | Admitting: Unknown Physician Specialty

## 2017-02-02 ENCOUNTER — Ambulatory Visit (INDEPENDENT_AMBULATORY_CARE_PROVIDER_SITE_OTHER): Payer: Managed Care, Other (non HMO) | Admitting: Unknown Physician Specialty

## 2017-02-02 VITALS — BP 130/74 | HR 88 | Temp 97.9°F | Ht 71.1 in | Wt 199.2 lb

## 2017-02-02 DIAGNOSIS — Z0000 Encounter for general adult medical examination without abnormal findings: Secondary | ICD-10-CM | POA: Diagnosis not present

## 2017-02-02 DIAGNOSIS — Z125 Encounter for screening for malignant neoplasm of prostate: Secondary | ICD-10-CM

## 2017-02-02 DIAGNOSIS — E785 Hyperlipidemia, unspecified: Secondary | ICD-10-CM | POA: Diagnosis not present

## 2017-02-02 DIAGNOSIS — I10 Essential (primary) hypertension: Secondary | ICD-10-CM | POA: Diagnosis not present

## 2017-02-02 DIAGNOSIS — F419 Anxiety disorder, unspecified: Secondary | ICD-10-CM | POA: Diagnosis not present

## 2017-02-02 DIAGNOSIS — K219 Gastro-esophageal reflux disease without esophagitis: Secondary | ICD-10-CM | POA: Diagnosis not present

## 2017-02-02 MED ORDER — ZOLPIDEM TARTRATE 5 MG PO TABS: 5.0000 mg | ORAL_TABLET | Freq: Every evening | ORAL | 0 refills | Status: DC | PRN

## 2017-02-02 NOTE — Assessment & Plan Note (Signed)
Stable, continue present medications.   

## 2017-02-02 NOTE — Assessment & Plan Note (Signed)
Anxiety improved with adjusting Zoloft

## 2017-02-02 NOTE — Assessment & Plan Note (Addendum)
Check labs today.  Note he is not fasting

## 2017-02-02 NOTE — Progress Notes (Signed)
f  BP 130/74   Pulse 88   Temp 97.9 F (36.6 C)   Ht 5' 11.1" (1.806 m)   Wt 199 lb 3.2 oz (90.4 kg)   SpO2 97%   BMI 27.70 kg/m    Subjective:    Patient ID: Chris Daugherty, male    DOB: 01/16/1956, 61 y.o.   MRN: [id number]  HPI: Chris Daugherty is a 61 y.o. male  Chief Complaint  Patient presents with  . Annual Exam    pt states he is interested in Hep C and HIV orders     Hypertension Using medications without difficulty Average home BPs   No problems or lightheadedness No chest pain with exertion or shortness of breath No Edema  Hyperlipidemia Not fasting Using medications without problems: No Muscle aches  Diet compliance:Pretty good Exercise:stays active  Anxiety Much better with adjustment of Sertraline. Uses the occasional Ambien and Clonazepam Depression screen [facility] 2/9 02/02/2017 10/27/2016 09/27/2016 07/12/2016 07/12/2016  Decreased Interest 0 0 2 0 0  Down, Depressed, Hopeless 0 0 2 0 0  PHQ - 2 Score 0 0 4 0 0  Altered sleeping 0 0 2 1 1   Tired, decreased energy 0 0 2 1 0  Change in appetite 0 0 1 1 0  Feeling bad or failure about yourself  0 0 0 0 0  Trouble concentrating 0 0 0 0 0  Moving slowly or fidgety/restless - 0 0 0 0  Suicidal thoughts 0 0 0 0 0  PHQ-9 Score 0 0 9 3 1      Relevant past medical, surgical, family and social history reviewed and updated as indicated. Interim medical history since our last visit reviewed. Allergies and medications reviewed and updated.  Review of Systems  Per HPI unless specifically indicated above     Objective:    BP 130/74   Pulse 88   Temp 97.9 F (36.6 C)   Ht 5' 11.1" (1.806 m)   Wt 199 lb 3.2 oz (90.4 kg)   SpO2 97%   BMI 27.70 kg/m   Wt Readings from Last 3 Encounters:  02/02/17 199 lb 3.2 oz (90.4 kg)  10/27/16 202 lb 9.6 oz (91.9 kg)  09/27/16 202 lb 9.6 oz (91.9 kg)    Physical Exam  Constitutional: He is oriented to person, place, and time. He appears well-developed and  well-nourished.  HENT:  Head: Normocephalic.  Right Ear: Tympanic membrane, external ear and ear canal normal.  Left Ear: Tympanic membrane, external ear and ear canal normal.  Mouth/Throat: Uvula is midline, oropharynx is clear and moist and mucous membranes are normal.  Eyes: Pupils are equal, round, and reactive to light.  Cardiovascular: Normal rate, regular rhythm and normal heart sounds.  Exam reveals no gallop and no friction rub.   No murmur heard. Pulmonary/Chest: Effort normal and breath sounds normal. No respiratory distress.  Abdominal: Soft. Bowel sounds are normal. He exhibits no distension. There is no tenderness.  Genitourinary:  Genitourinary Comments: refused  Musculoskeletal: Normal range of motion.  Neurological: He is alert and oriented to person, place, and time. He has normal reflexes.  Skin: Skin is warm and dry.  Psychiatric: He has a normal mood and affect. His behavior is normal. Judgment and thought content normal.    Results for orders placed or performed in visit on 09/27/16  TSH  Result Value Ref Range   TSH 1.920 0.450 - 4.500 uIU/mL  VITAMIN D 25 Hydroxy (Vit-D Deficiency, Fractures)  Result Value  Ref Range   Vit D, 25-Hydroxy 48.5 30.0 - 100.0 ng/mL  Vitamin B12  Result Value Ref Range   Vitamin B-12 410 232 - 1,245 pg/mL      Assessment & Plan:   Problem List Items Addressed This Visit      Unprioritized   Acid reflux    Stable, continue present medications.        Anxiety - Primary    Anxiety improved with adjusting Zoloft      Hyperlipidemia    Check labs today.  Note he is not fasting      Relevant Orders   Lipid Panel w/o Chol/HDL Ratio   Hypertension    Stable, continue present medications.        Relevant Orders   Comprehensive metabolic panel    Other Visit Diagnoses    Routine general medical examination at a health care facility       Relevant Orders   CBC with Differential/Platelet   Comprehensive metabolic  panel   Lipid Panel w/o Chol/HDL Ratio   TSH   VITAMIN D 25 Hydroxy (Vit-D Deficiency, Fractures)   Prostate cancer screening       Relevant Orders   PSA       Follow up plan: Return in about 6 months (around 08/02/2017) for Cholesterl.

## 2017-02-03 LAB — CBC WITH DIFFERENTIAL/PLATELET
BASOS ABS: 0 10*3/uL (ref 0.0–0.2)
Basos: 1 %
EOS (ABSOLUTE): 0.1 10*3/uL (ref 0.0–0.4)
Eos: 4 %
HEMOGLOBIN: 13.5 g/dL (ref 13.0–17.7)
Hematocrit: 39.8 % (ref 37.5–51.0)
Immature Grans (Abs): 0 10*3/uL (ref 0.0–0.1)
Immature Granulocytes: 0 %
LYMPHS ABS: 0.8 10*3/uL (ref 0.7–3.1)
Lymphs: 23 %
MCH: 28.1 pg (ref 26.6–33.0)
MCHC: 33.9 g/dL (ref 31.5–35.7)
MCV: 83 fL (ref 79–97)
MONOCYTES: 9 %
MONOS ABS: 0.3 10*3/uL (ref 0.1–0.9)
NEUTROS ABS: 2.1 10*3/uL (ref 1.4–7.0)
Neutrophils: 63 %
PLATELETS: 160 10*3/uL (ref 150–379)
RBC: 4.8 x10E6/uL (ref 4.14–5.80)
RDW: 14.7 % (ref 12.3–15.4)
WBC: 3.3 10*3/uL — AB (ref 3.4–10.8)

## 2017-02-03 LAB — COMPREHENSIVE METABOLIC PANEL
ALBUMIN: 4.9 g/dL — AB (ref 3.6–4.8)
ALK PHOS: 48 IU/L (ref 39–117)
ALT: 16 IU/L (ref 0–44)
AST: 22 IU/L (ref 0–40)
Albumin/Globulin Ratio: 2.3 — ABNORMAL HIGH (ref 1.2–2.2)
BILIRUBIN TOTAL: 0.4 mg/dL (ref 0.0–1.2)
BUN/Creatinine Ratio: 14 (ref 10–24)
BUN: 21 mg/dL (ref 8–27)
CO2: 26 mmol/L (ref 20–29)
Calcium: 9.8 mg/dL (ref 8.6–10.2)
Chloride: 100 mmol/L (ref 96–106)
Creatinine, Ser: 1.54 mg/dL — ABNORMAL HIGH (ref 0.76–1.27)
GFR, EST AFRICAN AMERICAN: 55 mL/min/{1.73_m2} — AB (ref >59)
GFR, EST NON AFRICAN AMERICAN: 48 mL/min/{1.73_m2} — AB (ref >59)
GLUCOSE: 78 mg/dL (ref 65–99)
Globulin, Total: 2.1 g/dL (ref 1.5–4.5)
POTASSIUM: 4.4 mmol/L (ref 3.5–5.2)
Sodium: 141 mmol/L (ref 134–144)
Total Protein: 7 g/dL (ref 6.0–8.5)

## 2017-02-03 LAB — LIPID PANEL W/O CHOL/HDL RATIO
Cholesterol, Total: 203 mg/dL — ABNORMAL HIGH (ref 100–199)
HDL: 38 mg/dL — AB (ref >39)
LDL Calculated: 131 mg/dL — ABNORMAL HIGH (ref 0–99)
Triglycerides: 172 mg/dL — ABNORMAL HIGH (ref 0–149)
VLDL Cholesterol Cal: 34 mg/dL (ref 5–40)

## 2017-02-03 LAB — VITAMIN D 25 HYDROXY (VIT D DEFICIENCY, FRACTURES): VIT D 25 HYDROXY: 44 ng/mL (ref 30.0–100.0)

## 2017-02-03 LAB — PSA: Prostate Specific Ag, Serum: 0.5 ng/mL (ref 0.0–4.0)

## 2017-02-03 LAB — TSH: TSH: 3.37 u[IU]/mL (ref 0.450–4.500)

## 2017-02-05 ENCOUNTER — Telehealth: Payer: Self-pay | Admitting: Unknown Physician Specialty

## 2017-02-05 NOTE — Telephone Encounter (Signed)
Discussed with pt high cholesterol and will work on diet and exercise.    Discuss GFR.  Recheck in 3 months after lots of fluids

## 2017-02-05 NOTE — Progress Notes (Signed)
Patient notified of results by phone and by phone

## 2017-04-04 ENCOUNTER — Other Ambulatory Visit: Payer: Self-pay | Admitting: Unknown Physician Specialty

## 2017-05-17 ENCOUNTER — Encounter: Payer: Self-pay | Admitting: Unknown Physician Specialty

## 2017-05-18 ENCOUNTER — Ambulatory Visit: Payer: Self-pay | Admitting: Unknown Physician Specialty

## 2017-05-25 ENCOUNTER — Ambulatory Visit: Payer: Self-pay | Admitting: Unknown Physician Specialty

## 2017-05-30 ENCOUNTER — Encounter: Payer: Self-pay | Admitting: Unknown Physician Specialty

## 2017-05-30 ENCOUNTER — Ambulatory Visit (INDEPENDENT_AMBULATORY_CARE_PROVIDER_SITE_OTHER): Payer: Self-pay | Admitting: Unknown Physician Specialty

## 2017-05-30 VITALS — BP 143/89 | HR 74 | Temp 98.2°F | Ht 71.0 in | Wt 206.8 lb

## 2017-05-30 DIAGNOSIS — R944 Abnormal results of kidney function studies: Secondary | ICD-10-CM | POA: Insufficient documentation

## 2017-05-30 DIAGNOSIS — K219 Gastro-esophageal reflux disease without esophagitis: Secondary | ICD-10-CM

## 2017-05-30 DIAGNOSIS — Z8669 Personal history of other diseases of the nervous system and sense organs: Secondary | ICD-10-CM

## 2017-05-30 DIAGNOSIS — E785 Hyperlipidemia, unspecified: Secondary | ICD-10-CM

## 2017-05-30 DIAGNOSIS — I10 Essential (primary) hypertension: Secondary | ICD-10-CM

## 2017-05-30 MED ORDER — PROMETHAZINE HCL 25 MG PO TABS: 25.0000 mg | ORAL_TABLET | Freq: Four times a day (QID) | ORAL | 1 refills | Status: DC | PRN

## 2017-05-30 MED ORDER — TRIAMCINOLONE ACETONIDE 0.1 % EX CREA: 1.0000 "application " | TOPICAL_CREAM | CUTANEOUS | 0 refills | Status: DC

## 2017-05-30 MED ORDER — SILDENAFIL CITRATE 20 MG PO TABS: 20.0000 mg | ORAL_TABLET | ORAL | 12 refills | Status: DC | PRN

## 2017-05-30 MED ORDER — FENOFIBRATE 145 MG PO TABS: 145.0000 mg | ORAL_TABLET | Freq: Every day | ORAL | 1 refills | Status: DC

## 2017-05-30 MED ORDER — SERTRALINE HCL 100 MG PO TABS: 200.0000 mg | ORAL_TABLET | Freq: Every day | ORAL | 1 refills | Status: DC

## 2017-05-30 MED ORDER — LOSARTAN POTASSIUM 100 MG PO TABS: 100.0000 mg | ORAL_TABLET | Freq: Every day | ORAL | 1 refills | Status: DC

## 2017-05-30 MED ORDER — SUMATRIPTAN SUCCINATE 100 MG PO TABS: 100.0000 mg | ORAL_TABLET | Freq: Every day | ORAL | 12 refills | Status: DC | PRN

## 2017-05-30 MED ORDER — METHOCARBAMOL 750 MG PO TABS: ORAL_TABLET | ORAL | 3 refills | Status: DC

## 2017-05-30 MED ORDER — AMLODIPINE BESYLATE 10 MG PO TABS: 10.0000 mg | ORAL_TABLET | Freq: Every day | ORAL | 1 refills | Status: DC

## 2017-05-30 MED ORDER — OMEPRAZOLE 20 MG PO CPDR: 20.0000 mg | DELAYED_RELEASE_CAPSULE | Freq: Every day | ORAL | 1 refills | Status: DC

## 2017-05-30 NOTE — Progress Notes (Signed)
BP (!) 143/89   Pulse 74   Temp 98.2 F (36.8 C) (Oral)   Ht 5\' 11"  (1.803 m)   Wt 206 lb 12.8 oz (93.8 kg)   SpO2 98%   BMI 28.84 kg/m    Subjective:    Patient ID: Chris Daugherty, male    DOB: 1955/11/12, 62 y.o.   MRN: [id number]  HPI: Chris Daugherty is a 62 y.o. male  Chief Complaint  Patient presents with  . Hyperlipidemia    3 month f/up    Low GFR Noted last visit.  Recheck today. Does not take OTC NSAIDs except Goody Powders on occasional.     Hypertension Using medications without difficulty.  Did not take medications today Average home BP not checking but OK past visits after taking medication.      No problems or lightheadedness No chest pain with exertion or shortness of breath No Edema   Hypercholesterolia Working on diet and exercise.   The 10-year ASCVD risk score Chris Daugherty) is: 17.1%   Values used to calculate the score:     Age: 41 years     Sex: Male     Is Non-Hispanic African American: No     Diabetic: No     Tobacco smoker: No     Systolic Blood Pressure: 924 mmHg     Is BP treated: Yes     HDL Cholesterol: 38 mg/dL     Total Cholesterol: 203 mg/dL  Relevant past medical, surgical, family and social history reviewed and updated as indicated. Interim medical history since our last visit reviewed. Allergies and medications reviewed and updated.  Review of Systems  Per HPI unless specifically indicated above     Objective:    BP (!) 143/89   Pulse 74   Temp 98.2 F (36.8 C) (Oral)   Ht 5\' 11"  (1.803 m)   Wt 206 lb 12.8 oz (93.8 kg)   SpO2 98%   BMI 28.84 kg/m   Wt Readings from Last 3 Encounters:  05/30/17 206 lb 12.8 oz (93.8 kg)  02/02/17 199 lb 3.2 oz (90.4 kg)  10/27/16 202 lb 9.6 oz (91.9 kg)    Physical Exam  Constitutional: He is oriented to person, place, and time. He appears well-developed and well-nourished. No distress.  HENT:  Head: Normocephalic and atraumatic.  Eyes: Conjunctivae and lids are  normal. Right eye exhibits no discharge. Left eye exhibits no discharge. No scleral icterus.  Neck: Normal range of motion. Neck supple. No JVD present. Carotid bruit is not present.  Cardiovascular: Normal rate, regular rhythm and normal heart sounds.  Pulmonary/Chest: Effort normal and breath sounds normal. No respiratory distress.  Abdominal: Normal appearance. There is no splenomegaly or hepatomegaly.  Musculoskeletal: Normal range of motion.  Neurological: He is alert and oriented to person, place, and time.  Skin: Skin is warm, dry and intact. No rash noted. No pallor.  Psychiatric: He has a normal mood and affect. His behavior is normal. Judgment and thought content normal.    Results for orders placed or performed in visit on 02/02/17  PSA  Result Value Ref Range   Prostate Specific Ag, Serum 0.5 0.0 - 4.0 ng/mL  CBC with Differential/Platelet  Result Value Ref Range   WBC 3.3 (L) 3.4 - 10.8 x10E3/uL   RBC 4.80 4.14 - 5.80 x10E6/uL   Hemoglobin 13.5 13.0 - 17.7 g/dL   Hematocrit 39.8 37.5 - 51.0 %   MCV 83 79 -  97 fL   MCH 28.1 26.6 - 33.0 pg   MCHC 33.9 31.5 - 35.7 g/dL   RDW 14.7 12.3 - 15.4 %   Platelets 160 150 - 379 x10E3/uL   Neutrophils 63 Not Estab. %   Lymphs 23 Not Estab. %   Monocytes 9 Not Estab. %   Eos 4 Not Estab. %   Basos 1 Not Estab. %   Neutrophils Absolute 2.1 1.4 - 7.0 x10E3/uL   Lymphocytes Absolute 0.8 0.7 - 3.1 x10E3/uL   Monocytes Absolute 0.3 0.1 - 0.9 x10E3/uL   EOS (ABSOLUTE) 0.1 0.0 - 0.4 x10E3/uL   Basophils Absolute 0.0 0.0 - 0.2 x10E3/uL   Immature Granulocytes 0 Not Estab. %   Immature Grans (Abs) 0.0 0.0 - 0.1 x10E3/uL  Comprehensive metabolic panel  Result Value Ref Range   Glucose 78 65 - 99 mg/dL   BUN 21 8 - 27 mg/dL   Creatinine, Ser 1.54 (H) 0.76 - 1.27 mg/dL   GFR calc non Af Amer 48 (L) >59 mL/min/1.73   GFR calc Af Amer 55 (L) >59 mL/min/1.73   BUN/Creatinine Ratio 14 10 - 24   Sodium 141 134 - 144 mmol/L   Potassium  4.4 3.5 - 5.2 mmol/L   Chloride 100 96 - 106 mmol/L   CO2 26 20 - 29 mmol/L   Calcium 9.8 8.6 - 10.2 mg/dL   Total Protein 7.0 6.0 - 8.5 g/dL   Albumin 4.9 (H) 3.6 - 4.8 g/dL   Globulin, Total 2.1 1.5 - 4.5 g/dL   Albumin/Globulin Ratio 2.3 (H) 1.2 - 2.2   Bilirubin Total 0.4 0.0 - 1.2 mg/dL   Alkaline Phosphatase 48 39 - 117 IU/L   AST 22 0 - 40 IU/L   ALT 16 0 - 44 IU/L  Lipid Panel w/o Chol/HDL Ratio  Result Value Ref Range   Cholesterol, Total 203 (H) 100 - 199 mg/dL   Triglycerides 172 (H) 0 - 149 mg/dL   HDL 38 (L) >39 mg/dL   VLDL Cholesterol Cal 34 5 - 40 mg/dL   LDL Calculated 131 (H) 0 - 99 mg/dL  TSH  Result Value Ref Range   TSH 3.370 0.450 - 4.500 uIU/mL  VITAMIN D 25 Hydroxy (Vit-D Deficiency, Fractures)  Result Value Ref Range   Vit D, 25-Hydroxy 44.0 30.0 - 100.0 ng/mL      Assessment & Plan:   Problem List Items Addressed This Visit      Unprioritized   Acid reflux    Stable.  Meds renewed      Relevant Medications   omeprazole (PRILOSEC) 20 MG capsule   Decreased calculated GFR   Relevant Orders   Comprehensive metabolic panel   Essential (primary) hypertension    Stable, continue present medications.        Relevant Medications   losartan (COZAAR) 100 MG tablet   fenofibrate (TRICOR) 145 MG tablet   amLODipine (NORVASC) 10 MG tablet   sildenafil (REVATIO) 20 MG tablet   Hx of migraine headaches    Stable.  Meds renewed      Relevant Medications   promethazine (PHENERGAN) 25 MG tablet   methocarbamol (ROBAXIN) 750 MG tablet   Hyperlipidemia - Primary    Discussed ASCVD risk score.  Recheck lipid panel.  Discuss after labs come back      Relevant Medications   losartan (COZAAR) 100 MG tablet   fenofibrate (TRICOR) 145 MG tablet   amLODipine (NORVASC) 10 MG tablet   sildenafil (REVATIO)  20 MG tablet   Other Relevant Orders   Lipid Panel w/o Chol/HDL Ratio       Losing insurance and wants all medications printed for new  pharmacy  Follow up plan: Return in about 6 months (around 11/27/2017).

## 2017-05-30 NOTE — Assessment & Plan Note (Signed)
Stable.  Meds renewed

## 2017-05-30 NOTE — Assessment & Plan Note (Signed)
Stable, continue present medications.   

## 2017-05-30 NOTE — Assessment & Plan Note (Signed)
Discussed ASCVD risk score.  Recheck lipid panel.  Discuss after labs come back

## 2017-05-31 LAB — LIPID PANEL W/O CHOL/HDL RATIO
Cholesterol, Total: 200 mg/dL — ABNORMAL HIGH (ref 100–199)
HDL: 38 mg/dL — ABNORMAL LOW (ref >39)
LDL Calculated: 129 mg/dL — ABNORMAL HIGH (ref 0–99)
Triglycerides: 166 mg/dL — ABNORMAL HIGH (ref 0–149)
VLDL Cholesterol Cal: 33 mg/dL (ref 5–40)

## 2017-05-31 LAB — COMPREHENSIVE METABOLIC PANEL
ALT: 20 IU/L (ref 0–44)
AST: 22 IU/L (ref 0–40)
Albumin/Globulin Ratio: 2.3 — ABNORMAL HIGH (ref 1.2–2.2)
Albumin: 4.8 g/dL (ref 3.6–4.8)
Alkaline Phosphatase: 45 IU/L (ref 39–117)
BUN/Creatinine Ratio: 13 (ref 10–24)
BUN: 19 mg/dL (ref 8–27)
Bilirubin Total: 0.3 mg/dL (ref 0.0–1.2)
CO2: 25 mmol/L (ref 20–29)
Calcium: 9.4 mg/dL (ref 8.6–10.2)
Chloride: 102 mmol/L (ref 96–106)
Creatinine, Ser: 1.45 mg/dL — ABNORMAL HIGH (ref 0.76–1.27)
GFR calc Af Amer: 59 mL/min/{1.73_m2} — ABNORMAL LOW (ref >59)
GFR calc non Af Amer: 51 mL/min/{1.73_m2} — ABNORMAL LOW (ref >59)
Globulin, Total: 2.1 g/dL (ref 1.5–4.5)
Glucose: 102 mg/dL — ABNORMAL HIGH (ref 65–99)
Potassium: 4.2 mmol/L (ref 3.5–5.2)
Sodium: 144 mmol/L (ref 134–144)
Total Protein: 6.9 g/dL (ref 6.0–8.5)

## 2017-08-03 ENCOUNTER — Ambulatory Visit: Payer: Managed Care, Other (non HMO) | Admitting: Unknown Physician Specialty

## 2017-08-07 ENCOUNTER — Ambulatory Visit (INDEPENDENT_AMBULATORY_CARE_PROVIDER_SITE_OTHER): Payer: Self-pay | Admitting: Unknown Physician Specialty

## 2017-08-07 ENCOUNTER — Telehealth: Payer: Self-pay | Admitting: Unknown Physician Specialty

## 2017-08-07 ENCOUNTER — Encounter: Payer: Self-pay | Admitting: Unknown Physician Specialty

## 2017-08-07 VITALS — BP 115/72 | HR 87 | Temp 98.2°F | Ht 71.0 in | Wt 207.6 lb

## 2017-08-07 DIAGNOSIS — E785 Hyperlipidemia, unspecified: Secondary | ICD-10-CM

## 2017-08-07 DIAGNOSIS — F5101 Primary insomnia: Secondary | ICD-10-CM

## 2017-08-07 DIAGNOSIS — Z8669 Personal history of other diseases of the nervous system and sense organs: Secondary | ICD-10-CM

## 2017-08-07 DIAGNOSIS — Z5181 Encounter for therapeutic drug level monitoring: Secondary | ICD-10-CM

## 2017-08-07 NOTE — Telephone Encounter (Signed)
Copied from [place]. Topic: Quick Communication - Rx Refill/Question >> Aug 07, 2017  3:54 PM Boyd Kerbs wrote: Medication:  zolpidem (AMBIEN) 5 MG tablet 30 tablets and 1 refill was discussed today   Has the patient contacted their pharmacy? Yes.   (Agent: If no, request that the patient contact the pharmacy for the refill.) Preferred Pharmacy (with phone number or street name):   Please call when ready to pick up  He will need to come by and pick up prescription as he uses [facility].   Agent: Please be advised that RX refills may take up to 3 business days. We ask that you follow-up with your pharmacy.

## 2017-08-07 NOTE — Assessment & Plan Note (Signed)
Stable on occasional Ambien 5 mg

## 2017-08-07 NOTE — Assessment & Plan Note (Signed)
Taking Imitrex at about every 2 weeks.  Also takes Phenergan. Robaxin BID for prevention

## 2017-08-07 NOTE — Assessment & Plan Note (Signed)
Check lipid panel today.  He is fasting

## 2017-08-07 NOTE — Progress Notes (Signed)
BP 115/72   Pulse 87   Temp 98.2 F (36.8 C) (Oral)   Ht 5\' 11"  (1.803 m)   Wt 207 lb 9.6 oz (94.2 kg)   SpO2 98%   BMI 28.95 kg/m    Subjective:    Patient ID: Chris Daugherty, male    DOB: 1955/07/28, 62 y.o.   MRN: [id number]  HPI: Chris Daugherty is a 62 y.o. male  Chief Complaint  Patient presents with  . Hyperlipidemia  . Hypertension  . Medication Refill    pt requests to have prescriptions printed to use [facility]   Hypertension Using medications without difficulty Average home BPs Not checking   No problems or lightheadedness No chest pain with exertion or shortness of breath No Edema  Hyperlipidemia Using medications without problems: No Muscle aches  Diet compliance:Exercise:Decreases fat, decreased soft drinks.  Not exercising on a consistent basis.  Not currently taking a statin.  Is on Tricor or elevated Triglycerides    The 10-year ASCVD risk score Mikey Bussing DC Jr., et al., 2013) is: 11.7%   Values used to calculate the score:     Age: 6 years     Sex: Male     Is Non-Hispanic African American: No     Diabetic: No     Tobacco smoker: No     Systolic Blood Pressure: 322 mmHg     Is BP treated: Yes     HDL Cholesterol: 38 mg/dL     Total Cholesterol: 200 mg/dL  Migraines On Robaxin BID for prevention.  Takes Imitrix and Phenergan prn for acute pain.  Has 1-2 headaches/month  Insomnia Pt states some evenings he is "keyed up." Takes Ambien on occasion but not every night.    Relevant past medical, surgical, family and social history reviewed and updated as indicated. Interim medical history since our last visit reviewed. Allergies and medications reviewed and updated.  Review of Systems  Per HPI unless specifically indicated above     Objective:    BP 115/72   Pulse 87   Temp 98.2 F (36.8 C) (Oral)   Ht 5\' 11"  (1.803 m)   Wt 207 lb 9.6 oz (94.2 kg)   SpO2 98%   BMI 28.95 kg/m   Wt Readings from Last 3 Encounters:  08/07/17 207 lb 9.6  oz (94.2 kg)  05/30/17 206 lb 12.8 oz (93.8 kg)  02/02/17 199 lb 3.2 oz (90.4 kg)    Physical Exam  Constitutional: He is oriented to person, place, and time. He appears well-developed and well-nourished. No distress.  HENT:  Head: Normocephalic and atraumatic.  Eyes: Conjunctivae and lids are normal. Right eye exhibits no discharge. Left eye exhibits no discharge. No scleral icterus.  Neck: Normal range of motion. Neck supple. No JVD present. Carotid bruit is not present.  Cardiovascular: Normal rate, regular rhythm and normal heart sounds.  Pulmonary/Chest: Effort normal and breath sounds normal. No respiratory distress.  Abdominal: Normal appearance. There is no splenomegaly or hepatomegaly.  Musculoskeletal: Normal range of motion.  Neurological: He is alert and oriented to person, place, and time.  Skin: Skin is warm, dry and intact. No rash noted. No pallor.  Psychiatric: He has a normal mood and affect. His behavior is normal. Judgment and thought content normal.    Results for orders placed or performed in visit on 05/30/17  Comprehensive metabolic panel  Result Value Ref Range   Glucose 102 (H) 65 - 99 mg/dL   BUN 19 8 -  27 mg/dL   Creatinine, Ser 1.45 (H) 0.76 - 1.27 mg/dL   GFR calc non Af Amer 51 (L) >59 mL/min/1.73   GFR calc Af Amer 59 (L) >59 mL/min/1.73   BUN/Creatinine Ratio 13 10 - 24   Sodium 144 134 - 144 mmol/L   Potassium 4.2 3.5 - 5.2 mmol/L   Chloride 102 96 - 106 mmol/L   CO2 25 20 - 29 mmol/L   Calcium 9.4 8.6 - 10.2 mg/dL   Total Protein 6.9 6.0 - 8.5 g/dL   Albumin 4.8 3.6 - 4.8 g/dL   Globulin, Total 2.1 1.5 - 4.5 g/dL   Albumin/Globulin Ratio 2.3 (H) 1.2 - 2.2   Bilirubin Total 0.3 0.0 - 1.2 mg/dL   Alkaline Phosphatase 45 39 - 117 IU/L   AST 22 0 - 40 IU/L   ALT 20 0 - 44 IU/L  Lipid Panel w/o Chol/HDL Ratio  Result Value Ref Range   Cholesterol, Total 200 (H) 100 - 199 mg/dL   Triglycerides 166 (H) 0 - 149 mg/dL   HDL 38 (L) >39 mg/dL    VLDL Cholesterol Cal 33 5 - 40 mg/dL   LDL Calculated 129 (H) 0 - 99 mg/dL      Assessment & Plan:   Problem List Items Addressed This Visit      Unprioritized   Hx of migraine headaches    Taking Imitrex at about every 2 weeks.  Also takes Phenergan. Robaxin BID for prevention      Hyperlipidemia    Check lipid panel today.  He is fasting      Relevant Orders   Comprehensive metabolic panel   Lipid Panel w/o Chol/HDL Ratio   Insomnia    Stable on occasional Ambien 5 mg       Other Visit Diagnoses    Medication monitoring encounter    -  Primary   Relevant Orders   Comprehensive metabolic panel       Follow up plan: Return in about 6 months (around 02/07/2018).

## 2017-08-08 ENCOUNTER — Encounter: Payer: Self-pay | Admitting: Unknown Physician Specialty

## 2017-08-08 LAB — COMPREHENSIVE METABOLIC PANEL
A/G RATIO: 2.6 — AB (ref 1.2–2.2)
ALT: 20 IU/L (ref 0–44)
AST: 23 IU/L (ref 0–40)
Albumin: 4.9 g/dL — ABNORMAL HIGH (ref 3.6–4.8)
Alkaline Phosphatase: 47 IU/L (ref 39–117)
BUN/Creatinine Ratio: 15 (ref 10–24)
BUN: 19 mg/dL (ref 8–27)
Bilirubin Total: 0.4 mg/dL (ref 0.0–1.2)
CO2: 25 mmol/L (ref 20–29)
CREATININE: 1.28 mg/dL — AB (ref 0.76–1.27)
Calcium: 9.5 mg/dL (ref 8.6–10.2)
Chloride: 100 mmol/L (ref 96–106)
GFR calc non Af Amer: 60 mL/min/{1.73_m2} (ref >59)
GFR, EST AFRICAN AMERICAN: 69 mL/min/{1.73_m2} (ref >59)
Globulin, Total: 1.9 g/dL (ref 1.5–4.5)
Glucose: 87 mg/dL (ref 65–99)
POTASSIUM: 4.3 mmol/L (ref 3.5–5.2)
Sodium: 140 mmol/L (ref 134–144)
TOTAL PROTEIN: 6.8 g/dL (ref 6.0–8.5)

## 2017-08-08 LAB — LIPID PANEL W/O CHOL/HDL RATIO
Cholesterol, Total: 193 mg/dL (ref 100–199)
HDL: 37 mg/dL — ABNORMAL LOW (ref >39)
LDL Calculated: 114 mg/dL — ABNORMAL HIGH (ref 0–99)
Triglycerides: 210 mg/dL — ABNORMAL HIGH (ref 0–149)
VLDL CHOLESTEROL CAL: 42 mg/dL — AB (ref 5–40)

## 2017-08-08 MED ORDER — ZOLPIDEM TARTRATE 5 MG PO TABS: 5.0000 mg | ORAL_TABLET | Freq: Every evening | ORAL | 0 refills | Status: DC | PRN

## 2017-08-08 NOTE — Telephone Encounter (Signed)
[phone] please call when ready for pick up

## 2017-08-09 NOTE — Telephone Encounter (Signed)
LVM that Rx was up front to be picked up

## 2017-09-03 ENCOUNTER — Encounter: Payer: Self-pay | Admitting: Family Medicine

## 2017-09-03 ENCOUNTER — Ambulatory Visit: Payer: Self-pay | Admitting: Family Medicine

## 2017-09-03 DIAGNOSIS — Z8619 Personal history of other infectious and parasitic diseases: Secondary | ICD-10-CM | POA: Insufficient documentation

## 2017-09-03 MED ORDER — CLOTRIMAZOLE-BETAMETHASONE 1-0.05 % EX LOTN: TOPICAL_LOTION | Freq: Two times a day (BID) | CUTANEOUS | 0 refills | Status: DC

## 2017-09-03 NOTE — Progress Notes (Signed)
BP 137/86   Pulse 82   Temp 98 F (36.7 C) (Oral)   Ht 5\' 11"  (1.803 m)   Wt 212 lb (96.2 kg)   SpO2 98%   BMI 29.57 kg/m    Subjective:    Patient ID: Chris Daugherty, male    DOB: 13-Nov-1955, 62 y.o.   MRN: [id number]  HPI: Chris Daugherty is a 62 y.o. male  Chief Complaint  Patient presents with  . Ear Pain    Left ear issues x 2 - 3 weeks. Outter ear. Draining, crusting over. Warm to touch.     Relevant past medical, surgical, family and social history reviewed and updated as indicated. Interim medical history since our last visit reviewed. Allergies and medications reviewed and updated.  Review of Systems  Per HPI unless specifically indicated above     Objective:    BP 137/86   Pulse 82   Temp 98 F (36.7 C) (Oral)   Ht 5\' 11"  (1.803 m)   Wt 212 lb (96.2 kg)   SpO2 98%   BMI 29.57 kg/m   Wt Readings from Last 3 Encounters:  09/03/17 212 lb (96.2 kg)  08/07/17 207 lb 9.6 oz (94.2 kg)  05/30/17 206 lb 12.8 oz (93.8 kg)    Physical Exam  Constitutional: He is oriented to person, place, and time. He appears well-developed and well-nourished.  HENT:  Head: Normocephalic and atraumatic.  Left TM clear left ear canal small amount of redness and crusting indicating fungal infection  Eyes: Conjunctivae and EOM are normal.  Neck: Normal range of motion.  Cardiovascular: Normal rate, regular rhythm and normal heart sounds.  Pulmonary/Chest: Effort normal and breath sounds normal.  Musculoskeletal: Normal range of motion.  Neurological: He is alert and oriented to person, place, and time.  Skin: No erythema.  Psychiatric: He has a normal mood and affect. His behavior is normal. Judgment and thought content normal.    Results for orders placed or performed in visit on 08/07/17  Comprehensive metabolic panel  Result Value Ref Range   Glucose 87 65 - 99 mg/dL   BUN 19 8 - 27 mg/dL   Creatinine, Ser 1.28 (H) 0.76 - 1.27 mg/dL   GFR calc non Af Amer 60 >59  mL/min/1.73   GFR calc Af Amer 69 >59 mL/min/1.73   BUN/Creatinine Ratio 15 10 - 24   Sodium 140 134 - 144 mmol/L   Potassium 4.3 3.5 - 5.2 mmol/L   Chloride 100 96 - 106 mmol/L   CO2 25 20 - 29 mmol/L   Calcium 9.5 8.6 - 10.2 mg/dL   Total Protein 6.8 6.0 - 8.5 g/dL   Albumin 4.9 (H) 3.6 - 4.8 g/dL   Globulin, Total 1.9 1.5 - 4.5 g/dL   Albumin/Globulin Ratio 2.6 (H) 1.2 - 2.2   Bilirubin Total 0.4 0.0 - 1.2 mg/dL   Alkaline Phosphatase 47 39 - 117 IU/L   AST 23 0 - 40 IU/L   ALT 20 0 - 44 IU/L  Lipid Panel w/o Chol/HDL Ratio  Result Value Ref Range   Cholesterol, Total 193 100 - 199 mg/dL   Triglycerides 210 (H) 0 - 149 mg/dL   HDL 37 (L) >39 mg/dL   VLDL Cholesterol Cal 42 (H) 5 - 40 mg/dL   LDL Calculated 114 (H) 0 - 99 mg/dL      Assessment & Plan:   Problem List Items Addressed This Visit    None    Tinea external ear  canal discussed Lotrisone treatment  Follow up plan: Return if symptoms worsen or fail to improve, for As scheduled.

## 2017-09-03 NOTE — Assessment & Plan Note (Signed)
Lotrisone 

## 2017-09-04 ENCOUNTER — Telehealth: Payer: Self-pay | Admitting: Unknown Physician Specialty

## 2017-09-04 MED ORDER — CLOTRIMAZOLE-BETAMETHASONE 1-0.05 % EX CREA: 1.0000 "application " | TOPICAL_CREAM | Freq: Two times a day (BID) | CUTANEOUS | 1 refills | Status: DC

## 2017-09-04 NOTE — Telephone Encounter (Signed)
Copied from [place] [id number]. Topic: Quick Communication - See Telephone Encounter >> Sep 04, 2017 10:11 AM Rutherford Nail, NT wrote: CRM for notification. See Telephone encounter for: 09/04/17. Patient is requesting the clotrimazole-betamethasone (LOTRISONE) lotion switched to a cream, if that will help for his fungal infection. States that the cream was $6 and the lotion was $55. Please advise. Patient would like a call back to discuss what is decided. CB#: [place], [place]

## 2017-09-04 NOTE — Telephone Encounter (Signed)
Happy to change

## 2017-09-04 NOTE — Telephone Encounter (Signed)
?   That was done

## 2017-09-04 NOTE — Telephone Encounter (Signed)
Patient would like new script sent to [facility] to switch from lotion to the cream (Lotrisone)

## 2017-11-26 ENCOUNTER — Other Ambulatory Visit: Payer: Self-pay | Admitting: Unknown Physician Specialty

## 2017-11-26 DIAGNOSIS — I10 Essential (primary) hypertension: Secondary | ICD-10-CM

## 2018-01-17 ENCOUNTER — Other Ambulatory Visit: Payer: Self-pay | Admitting: Physician Assistant

## 2018-01-17 ENCOUNTER — Other Ambulatory Visit: Payer: Self-pay | Admitting: Unknown Physician Specialty

## 2018-01-17 DIAGNOSIS — E785 Hyperlipidemia, unspecified: Secondary | ICD-10-CM

## 2018-01-17 DIAGNOSIS — K219 Gastro-esophageal reflux disease without esophagitis: Secondary | ICD-10-CM

## 2018-01-17 DIAGNOSIS — I10 Essential (primary) hypertension: Secondary | ICD-10-CM

## 2018-01-17 NOTE — Telephone Encounter (Signed)
Refill request for Sertraline approved and has appt 02/08/18 with Apolonio Schneiders.

## 2018-02-08 ENCOUNTER — Ambulatory Visit: Payer: Self-pay | Admitting: Unknown Physician Specialty

## 2018-02-08 ENCOUNTER — Ambulatory Visit: Payer: Self-pay | Admitting: Family Medicine

## 2018-02-11 ENCOUNTER — Ambulatory Visit: Payer: Self-pay | Admitting: Family Medicine

## 2018-02-27 ENCOUNTER — Ambulatory Visit: Payer: Self-pay | Admitting: Family Medicine

## 2018-02-27 ENCOUNTER — Encounter: Payer: Self-pay | Admitting: Family Medicine

## 2018-02-27 VITALS — BP 118/78 | HR 73 | Temp 97.9°F | Ht 71.0 in | Wt 209.1 lb

## 2018-02-27 DIAGNOSIS — E785 Hyperlipidemia, unspecified: Secondary | ICD-10-CM

## 2018-02-27 DIAGNOSIS — I10 Essential (primary) hypertension: Secondary | ICD-10-CM

## 2018-02-27 DIAGNOSIS — F419 Anxiety disorder, unspecified: Secondary | ICD-10-CM

## 2018-02-27 MED ORDER — SERTRALINE HCL 100 MG PO TABS: 200.0000 mg | ORAL_TABLET | Freq: Every day | ORAL | 1 refills | Status: DC

## 2018-02-27 MED ORDER — AMLODIPINE BESYLATE 10 MG PO TABS: 10.0000 mg | ORAL_TABLET | Freq: Every day | ORAL | 1 refills | Status: DC

## 2018-02-27 MED ORDER — FENOFIBRATE 145 MG PO TABS: 145.0000 mg | ORAL_TABLET | Freq: Every day | ORAL | 1 refills | Status: DC

## 2018-02-27 MED ORDER — LOSARTAN POTASSIUM 100 MG PO TABS: 100.0000 mg | ORAL_TABLET | Freq: Every day | ORAL | 1 refills | Status: DC

## 2018-02-27 NOTE — Progress Notes (Signed)
BP 118/78 (BP Location: Left Arm, Patient Position: Sitting, Cuff Size: Normal)   Pulse 73   Temp 97.9 F (36.6 C)   Ht 5\' 11"  (1.803 m)   Wt 209 lb 1 oz (94.8 kg)   SpO2 100%   BMI 29.16 kg/m    Subjective:    Patient ID: Chris Daugherty, male    DOB: May 23, 1955, 62 y.o.   MRN: [id number]  HPI: Chris Daugherty is a 62 y.o. male  Chief Complaint  Patient presents with  . Hyperlipidemia  . Hypertension   Here today for 6 month f/u.   HTN - Does not check home BPs, typically runs well for him so stopped checking. Taking medications faithfully without side effects. Denies CP, SOB, dizziness, HAs.   Taking tricor for cholesterol management and tries to eat well and stay active. Denies claudication, CP, SOB.   Zoloft doing very well for his anxiety at 200 mg dose. Denies SI/HI, sleep or appetite issues. No concerns with moods.   Depression screen [facility] 2/9 02/27/2018 09/03/2017 08/07/2017  Decreased Interest 0 0 0  Down, Depressed, Hopeless 0 0 0  PHQ - 2 Score 0 0 0  Altered sleeping - - -  Tired, decreased energy - - -  Change in appetite - - -  Feeling bad or failure about yourself  - - -  Trouble concentrating - - -  Moving slowly or fidgety/restless - - -  Suicidal thoughts - - -  PHQ-9 Score - - -   Past Medical History:  Diagnosis Date  . Acid reflux   . Allergic rhinitis   . Allergy   . Anxiety   . Depression   . Hyperlipidemia   . Hypertension   . Testicular hypofunction    Social History   Socioeconomic History  . Marital status: Married    Spouse name: Not on file  . Number of children: Not on file  . Years of education: Not on file  . Highest education level: Not on file  Occupational History  . Not on file  Social Needs  . Financial resource strain: Not on file  . Food insecurity:    Worry: Not on file    Inability: Not on file  . Transportation needs:    Medical: Not on file    Non-medical: Not on file  Tobacco Use  . Smoking status: Former  Smoker    Last attempt to quit: 11/13/1974    Years since quitting: 43.3  . Smokeless tobacco: Never Used  Substance and Sexual Activity  . Alcohol use: No  . Drug use: No  . Sexual activity: Yes  Lifestyle  . Physical activity:    Days per week: Not on file    Minutes per session: Not on file  . Stress: Not on file  Relationships  . Social connections:    Talks on phone: Not on file    Gets together: Not on file    Attends religious service: Not on file    Active member of club or organization: Not on file    Attends meetings of clubs or organizations: Not on file    Relationship status: Not on file  . Intimate partner violence:    Fear of current or ex partner: Not on file    Emotionally abused: Not on file    Physically abused: Not on file    Forced sexual activity: Not on file  Other Topics Concern  . Not on file  Social  History Narrative  . Not on file    Relevant past medical, surgical, family and social history reviewed and updated as indicated. Interim medical history since our last visit reviewed. Allergies and medications reviewed and updated.  Review of Systems  Per HPI unless specifically indicated above     Objective:    BP 118/78 (BP Location: Left Arm, Patient Position: Sitting, Cuff Size: Normal)   Pulse 73   Temp 97.9 F (36.6 C)   Ht 5\' 11"  (1.803 m)   Wt 209 lb 1 oz (94.8 kg)   SpO2 100%   BMI 29.16 kg/m   Wt Readings from Last 3 Encounters:  02/27/18 209 lb 1 oz (94.8 kg)  09/03/17 212 lb (96.2 kg)  08/07/17 207 lb 9.6 oz (94.2 kg)    Physical Exam  Constitutional: He is oriented to person, place, and time. He appears well-developed and well-nourished. No distress.  HENT:  Head: Atraumatic.  Eyes: Pupils are equal, round, and reactive to light. Conjunctivae and EOM are normal.  Neck: Normal range of motion. Neck supple.  Cardiovascular: Normal rate, regular rhythm and normal heart sounds.  Pulmonary/Chest: Effort normal and breath  sounds normal.  Musculoskeletal: Normal range of motion.  Neurological: He is alert and oriented to person, place, and time.  Skin: Skin is warm and dry.  Psychiatric: He has a normal mood and affect. His behavior is normal. Thought content normal.  Nursing note and vitals reviewed.   Results for orders placed or performed in visit on 02/27/18  Comprehensive metabolic panel  Result Value Ref Range   Glucose 93 65 - 99 mg/dL   BUN 20 8 - 27 mg/dL   Creatinine, Ser 1.56 (H) 0.76 - 1.27 mg/dL   GFR calc non Af Amer 47 (L) >59 mL/min/1.73   GFR calc Af Amer 54 (L) >59 mL/min/1.73   BUN/Creatinine Ratio 13 10 - 24   Sodium 139 134 - 144 mmol/L   Potassium 3.9 3.5 - 5.2 mmol/L   Chloride 101 96 - 106 mmol/L   CO2 23 20 - 29 mmol/L   Calcium 9.6 8.6 - 10.2 mg/dL   Total Protein 6.6 6.0 - 8.5 g/dL   Albumin 4.7 3.6 - 4.8 g/dL   Globulin, Total 1.9 1.5 - 4.5 g/dL   Albumin/Globulin Ratio 2.5 (H) 1.2 - 2.2   Bilirubin Total 0.3 0.0 - 1.2 mg/dL   Alkaline Phosphatase 45 39 - 117 IU/L   AST 18 0 - 40 IU/L   ALT 17 0 - 44 IU/L  Lipid Panel w/o Chol/HDL Ratio  Result Value Ref Range   Cholesterol, Total 174 100 - 199 mg/dL   Triglycerides 199 (H) 0 - 149 mg/dL   HDL 34 (L) >39 mg/dL   VLDL Cholesterol Cal 40 5 - 40 mg/dL   LDL Calculated 100 (H) 0 - 99 mg/dL      Assessment & Plan:   Problem List Items Addressed This Visit      Cardiovascular and Mediastinum   Essential (primary) hypertension    Stable and WNL, continue current regimen      Relevant Medications   amLODipine (NORVASC) 10 MG tablet (Start on 04/19/2018)   fenofibrate (TRICOR) 145 MG tablet (Start on 04/19/2018)   losartan (COZAAR) 100 MG tablet (Start on 04/19/2018)     Other   Anxiety    Stable on zoloft, continue current regimen      Relevant Medications   sertraline (ZOLOFT) 100 MG tablet (Start on 04/19/2018)  Hyperlipidemia - Primary    Recheck lipids and adjust as needed. Continue working on lifestyle  modifications      Relevant Medications   amLODipine (NORVASC) 10 MG tablet (Start on 04/19/2018)   fenofibrate (TRICOR) 145 MG tablet (Start on 04/19/2018)   losartan (COZAAR) 100 MG tablet (Start on 04/19/2018)   Other Relevant Orders   Lipid Panel w/o Chol/HDL Ratio (Completed)    Other Visit Diagnoses    Essential hypertension       Relevant Medications   amLODipine (NORVASC) 10 MG tablet (Start on 04/19/2018)   fenofibrate (TRICOR) 145 MG tablet (Start on 04/19/2018)   losartan (COZAAR) 100 MG tablet (Start on 04/19/2018)   Other Relevant Orders   Comprehensive metabolic panel (Completed)       Follow up plan: Return in about 6 months (around 08/28/2018) for 6 month f/u.

## 2018-02-28 LAB — COMPREHENSIVE METABOLIC PANEL
A/G RATIO: 2.5 — AB (ref 1.2–2.2)
ALBUMIN: 4.7 g/dL (ref 3.6–4.8)
ALT: 17 IU/L (ref 0–44)
AST: 18 IU/L (ref 0–40)
Alkaline Phosphatase: 45 IU/L (ref 39–117)
BUN / CREAT RATIO: 13 (ref 10–24)
BUN: 20 mg/dL (ref 8–27)
Bilirubin Total: 0.3 mg/dL (ref 0.0–1.2)
CALCIUM: 9.6 mg/dL (ref 8.6–10.2)
CO2: 23 mmol/L (ref 20–29)
Chloride: 101 mmol/L (ref 96–106)
Creatinine, Ser: 1.56 mg/dL — ABNORMAL HIGH (ref 0.76–1.27)
GFR, EST AFRICAN AMERICAN: 54 mL/min/{1.73_m2} — AB (ref >59)
GFR, EST NON AFRICAN AMERICAN: 47 mL/min/{1.73_m2} — AB (ref >59)
GLUCOSE: 93 mg/dL (ref 65–99)
Globulin, Total: 1.9 g/dL (ref 1.5–4.5)
Potassium: 3.9 mmol/L (ref 3.5–5.2)
Sodium: 139 mmol/L (ref 134–144)
TOTAL PROTEIN: 6.6 g/dL (ref 6.0–8.5)

## 2018-02-28 LAB — LIPID PANEL W/O CHOL/HDL RATIO
Cholesterol, Total: 174 mg/dL (ref 100–199)
HDL: 34 mg/dL — AB (ref >39)
LDL Calculated: 100 mg/dL — ABNORMAL HIGH (ref 0–99)
Triglycerides: 199 mg/dL — ABNORMAL HIGH (ref 0–149)
VLDL CHOLESTEROL CAL: 40 mg/dL (ref 5–40)

## 2018-03-04 NOTE — Assessment & Plan Note (Signed)
Stable and WNL, continue current regimen 

## 2018-03-04 NOTE — Assessment & Plan Note (Signed)
Stable on zoloft, continue current regimen

## 2018-03-04 NOTE — Assessment & Plan Note (Signed)
Recheck lipids and adjust as needed. Continue working on lifestyle modifications

## 2018-04-18 ENCOUNTER — Other Ambulatory Visit: Payer: Self-pay | Admitting: Family Medicine

## 2018-04-18 MED ORDER — SUMATRIPTAN SUCCINATE 100 MG PO TABS: 100.0000 mg | ORAL_TABLET | Freq: Every day | ORAL | 3 refills | Status: DC | PRN

## 2018-04-18 NOTE — Telephone Encounter (Signed)
Copied from [place] [phone] [id number]. Topic: Quick Communication - Rx Refill/Question >> Apr 18, 2018 10:35 AM Leward Quan A wrote: Medication: SUMAtriptan (IMITREX) 100 MG tablet  Has the patient contacted their pharmacy? Yes.   (Agent: If no, request that the patient contact the pharmacy for the refill.) (Agent: If yes, when and what did the pharmacy advise?)  Preferred Pharmacy (with phone number or street name): [facility], [place] [phone] (Phone) [phone] (Fax)    Agent: Please be advised that RX refills may take up to 3 business days. We ask that you follow-up with your pharmacy.

## 2018-04-25 ENCOUNTER — Other Ambulatory Visit: Payer: Self-pay | Admitting: *Deleted

## 2018-04-25 ENCOUNTER — Telehealth: Payer: Self-pay | Admitting: Family Medicine

## 2018-04-25 MED ORDER — SUMATRIPTAN SUCCINATE 100 MG PO TABS: 100.0000 mg | ORAL_TABLET | Freq: Every day | ORAL | 3 refills | Status: DC | PRN

## 2018-04-25 NOTE — Telephone Encounter (Unsigned)
Copied from [place] [phone]. Topic: General - Other >> Apr 25, 2018  9:38 AM Carolyn Stare wrote:  The below med was sent to the incorrect pharmacy please resend to [facility]   SUMAtriptan (IMITREX) 100 MG tablet

## 2018-04-25 NOTE — Telephone Encounter (Signed)
Rx sent to pharmacy requested by patient

## 2018-06-04 ENCOUNTER — Other Ambulatory Visit: Payer: Self-pay | Admitting: Unknown Physician Specialty

## 2018-06-04 DIAGNOSIS — Z8669 Personal history of other diseases of the nervous system and sense organs: Secondary | ICD-10-CM

## 2018-06-04 NOTE — Telephone Encounter (Signed)
Requested medication (s) are due for refill today: yes  Requested medication (s) are on the active medication list: yes    Last refill: 05/30/2017 #180 3 refills  Future visit scheduled yes  08/30/2018  R. LAne  Notes to clinic:not delegated  Requested Prescriptions  Pending Prescriptions Disp Refills   methocarbamol (ROBAXIN) 750 MG tablet [Pharmacy Med Name: METHOCARBAMOL 750 MG TABLET] 180 tablet 2    Sig: TAKE ONE TABLET BY MOUTH TWICE A DAY     Not Delegated - Analgesics:  Muscle Relaxants Failed - 06/04/2018  6:20 PM      Failed - This refill cannot be delegated      Passed - Valid encounter within last 6 months    Recent Outpatient Visits          3 months ago Hyperlipidemia, unspecified hyperlipidemia type   [facility] Volney American, [place]   9 months ago H/O tinea   [facility], Jeannette How, MD   10 months ago Medication monitoring encounter   [place], NP   1 year ago Hyperlipidemia, unspecified hyperlipidemia type   [facility] Kathrine Haddock, NP   1 year ago [place], NP      Future Appointments            In 2 months Orene Desanctis, Lilia Argue, [place], [place]

## 2018-06-06 ENCOUNTER — Telehealth: Payer: Self-pay | Admitting: Family Medicine

## 2018-06-06 DIAGNOSIS — Z8669 Personal history of other diseases of the nervous system and sense organs: Secondary | ICD-10-CM

## 2018-06-06 NOTE — Telephone Encounter (Signed)
Copied from [place] [id number]. Topic: Quick Communication - Rx Refill/Question >> Jun 06, 2018  4:05 PM Mcneil, Ja-Kwan wrote: Medication: promethazine (PHENERGAN) 25 MG tablet  Has the patient contacted their pharmacy? yes   Preferred Pharmacy (with phone number or street name): [place], [place] [phone] (Phone)  [phone] (Fax)  Agent: Please be advised that RX refills may take up to 3 business days. We ask that you follow-up with your pharmacy.

## 2018-06-07 MED ORDER — PROMETHAZINE HCL 25 MG PO TABS: 25.0000 mg | ORAL_TABLET | Freq: Four times a day (QID) | ORAL | 0 refills | Status: DC | PRN

## 2018-06-13 NOTE — Telephone Encounter (Signed)
Pt  called for RX promethazine (PHENERGAN) 25 MG tablet . He called [facility] and they do not have. RX said "print" so maybe it didn't go thru. Please resend.

## 2018-06-13 NOTE — Telephone Encounter (Signed)
Pt aware.

## 2018-06-13 NOTE — Telephone Encounter (Signed)
R/X phoned in.

## 2018-07-03 ENCOUNTER — Other Ambulatory Visit: Payer: Self-pay | Admitting: Unknown Physician Specialty

## 2018-07-03 DIAGNOSIS — K219 Gastro-esophageal reflux disease without esophagitis: Secondary | ICD-10-CM

## 2018-08-29 ENCOUNTER — Telehealth: Payer: Self-pay | Admitting: Family Medicine

## 2018-08-29 NOTE — Telephone Encounter (Signed)
Called pt to reschedule physical, no answer, left voicemail.

## 2018-08-30 ENCOUNTER — Encounter: Payer: Self-pay | Admitting: Family Medicine

## 2018-09-12 ENCOUNTER — Encounter: Payer: Self-pay | Admitting: Family Medicine

## 2018-09-12 ENCOUNTER — Other Ambulatory Visit: Payer: Self-pay

## 2018-09-12 ENCOUNTER — Ambulatory Visit: Payer: Self-pay | Admitting: Family Medicine

## 2018-09-12 VITALS — BP 138/88 | HR 78 | Temp 98.1°F | Ht 71.65 in | Wt 206.0 lb

## 2018-09-12 DIAGNOSIS — K219 Gastro-esophageal reflux disease without esophagitis: Secondary | ICD-10-CM

## 2018-09-12 DIAGNOSIS — E785 Hyperlipidemia, unspecified: Secondary | ICD-10-CM

## 2018-09-12 DIAGNOSIS — I10 Essential (primary) hypertension: Secondary | ICD-10-CM

## 2018-09-12 DIAGNOSIS — E559 Vitamin D deficiency, unspecified: Secondary | ICD-10-CM

## 2018-09-12 DIAGNOSIS — F419 Anxiety disorder, unspecified: Secondary | ICD-10-CM

## 2018-09-12 LAB — UA/M W/RFLX CULTURE, ROUTINE
Bilirubin, UA: NEGATIVE
Glucose, UA: NEGATIVE
Ketones, UA: NEGATIVE
Leukocytes,UA: NEGATIVE
Nitrite, UA: NEGATIVE
Protein,UA: NEGATIVE
RBC, UA: NEGATIVE
Specific Gravity, UA: 1.015 (ref 1.005–1.030)
Urobilinogen, Ur: 0.2 mg/dL (ref 0.2–1.0)
pH, UA: 5.5 (ref 5.0–7.5)

## 2018-09-12 MED ORDER — AMLODIPINE BESYLATE 10 MG PO TABS: 10.0000 mg | ORAL_TABLET | Freq: Every day | ORAL | 1 refills | Status: DC

## 2018-09-12 MED ORDER — LOSARTAN POTASSIUM 100 MG PO TABS: 100.0000 mg | ORAL_TABLET | Freq: Every day | ORAL | 1 refills | Status: DC

## 2018-09-12 MED ORDER — FENOFIBRATE 145 MG PO TABS: 145.0000 mg | ORAL_TABLET | Freq: Every day | ORAL | 1 refills | Status: DC

## 2018-09-12 MED ORDER — SERTRALINE HCL 100 MG PO TABS: 200.0000 mg | ORAL_TABLET | Freq: Every day | ORAL | 1 refills | Status: DC

## 2018-09-12 MED ORDER — SUMATRIPTAN SUCCINATE 100 MG PO TABS: 100.0000 mg | ORAL_TABLET | Freq: Every day | ORAL | 3 refills | Status: DC | PRN

## 2018-09-12 MED ORDER — OMEPRAZOLE 20 MG PO CPDR: 20.0000 mg | DELAYED_RELEASE_CAPSULE | Freq: Every day | ORAL | 0 refills | Status: DC

## 2018-09-12 NOTE — Progress Notes (Deleted)
BP 138/88   Pulse 78   Temp 98.1 F (36.7 C) (Oral)   Ht 5' 11.65" (1.82 m)   Wt 206 lb (93.4 kg)   SpO2 100%   BMI 28.21 kg/m    Subjective:    Patient ID: Chris Daugherty, male    DOB: October 09, 1955, 63 y.o.   MRN: [id number]  HPI: Chris Daugherty is a 63 y.o. male presenting on 09/12/2018 for comprehensive medical examination. Current medical complaints include:see below  HTN - Has not been checking BP readings at home. Taking his medications faithfully without side effects. Denies CP, SOB, HAs, dizziness.   HLD - taking tricor without issue. Trying hard to watch what he eats and stay active. Denies claudication, CP, SOB.   Zoloft helping significantly with moods. Denies SI/HI, sleep issues, mood swings.   Prilosec for GERD helping, no breakthrough sxs.   He currently lives with: Interim Problems from his last visit: no  Depression Screen done today and results listed below:  Depression screen [facility] 2/9 02/27/2018 09/03/2017 08/07/2017 05/30/2017 02/02/2017  Decreased Interest 0 0 0 0 0  Down, Depressed, Hopeless 0 0 0 0 0  PHQ - 2 Score 0 0 0 0 0  Altered sleeping - - - 0 0  Tired, decreased energy - - - 0 0  Change in appetite - - - 0 0  Feeling bad or failure about yourself  - - - 0 0  Trouble concentrating - - - 0 0  Moving slowly or fidgety/restless - - - 0 -  Suicidal thoughts - - - 0 0  PHQ-9 Score - - - 0 0    The patient does not have a history of falls. I did not complete a risk assessment for falls. A plan of care for falls was not documented.   Past Medical History:  Past Medical History:  Diagnosis Date  . Acid reflux   . Allergic rhinitis   . Allergy   . Anxiety   . Depression   . Hyperlipidemia   . Hypertension   . Testicular hypofunction     Surgical History:  Past Surgical History:  Procedure Laterality Date  . BASAL CELL CARCINOMA EXCISION     left side of face  . CHOLECYSTECTOMY    . COLONOSCOPY  04/30/2014  . GASTRIC FUNDOPLICATION    .  INGUINAL HERNIA REPAIR    . PARTIAL NEPHRECTOMY Left   . VASECTOMY      Medications:  Current Outpatient Medications on File Prior to Visit  Medication Sig  . amLODipine (NORVASC) 10 MG tablet Take 1 tablet (10 mg total) by mouth daily.  . clotrimazole-betamethasone (LOTRISONE) cream Apply 1 application topically 2 (two) times daily.  . fenofibrate (TRICOR) 145 MG tablet Take 1 tablet (145 mg total) by mouth daily.  [facility] Kitchen losartan (COZAAR) 100 MG tablet Take 1 tablet (100 mg total) by mouth daily.  . methocarbamol (ROBAXIN) 750 MG tablet TAKE ONE TABLET BY MOUTH TWICE A DAY  . omeprazole (PRILOSEC) 20 MG capsule TAKE ONE CAPSULE BY MOUTH DAILY  . promethazine (PHENERGAN) 25 MG tablet Take 1 tablet (25 mg total) by mouth every 6 (six) hours as needed for nausea or vomiting.  . sertraline (ZOLOFT) 100 MG tablet Take 2 tablets (200 mg total) by mouth daily.  . sildenafil (REVATIO) 20 MG tablet Take 1 tablet (20 mg total) by mouth as needed.  . SUMAtriptan (IMITREX) 100 MG tablet Take 1 tablet (100 mg total) by mouth daily as  needed.  . triamcinolone cream (KENALOG) 0.1 % Apply 1 application topically once a week.  . Vitamin D, Cholecalciferol, 400 UNITS TABS Take 400 Units by mouth daily.    No current facility-administered medications on file prior to visit.     Allergies:  No Known Allergies  Social History:  Social History   Socioeconomic History  . Marital status: Married    Spouse name: Not on file  . Number of children: Not on file  . Years of education: Not on file  . Highest education level: Not on file  Occupational History  . Not on file  Social Needs  . Financial resource strain: Not on file  . Food insecurity    Worry: Not on file    Inability: Not on file  . Transportation needs    Medical: Not on file    Non-medical: Not on file  Tobacco Use  . Smoking status: Former Smoker    Quit date: 11/13/1974    Years since quitting: 43.8  . Smokeless tobacco: Never Used   Substance and Sexual Activity  . Alcohol use: No  . Drug use: No  . Sexual activity: Yes  Lifestyle  . Physical activity    Days per week: Not on file    Minutes per session: Not on file  . Stress: Not on file  Relationships  . Social Herbalist on phone: Not on file    Gets together: Not on file    Attends religious service: Not on file    Active member of club or organization: Not on file    Attends meetings of clubs or organizations: Not on file    Relationship status: Not on file  . Intimate partner violence    Fear of current or ex partner: Not on file    Emotionally abused: Not on file    Physically abused: Not on file    Forced sexual activity: Not on file  Other Topics Concern  . Not on file  Social History Narrative  . Not on file   Social History   Tobacco Use  Smoking Status Former Smoker  . Quit date: 11/13/1974  . Years since quitting: 43.8  Smokeless Tobacco Never Used   Social History   Substance and Sexual Activity  Alcohol Use No    Family History:  Family History  Problem Relation Age of Onset  . Cancer Maternal Grandfather 36       stomaCH  . Hypertension Mother     Past medical history, surgical history, medications, allergies, family history and social history reviewed with patient today and changes made to appropriate areas of the chart.   Review of Systems - General ROS: negative Psychological ROS: negative Ophthalmic ROS: negative ENT ROS: negative Allergy and Immunology ROS: negative Hematological and Lymphatic ROS: negative Endocrine ROS: negative Respiratory ROS: no cough, shortness of breath, or wheezing Cardiovascular ROS: no chest pain or dyspnea on exertion Gastrointestinal ROS: no abdominal pain, change in bowel habits, or black or bloody stools Genito-Urinary ROS: no dysuria, trouble voiding, or hematuria Musculoskeletal ROS: negative Neurological ROS: no TIA or stroke symptoms Dermatological ROS: negative All  other ROS negative except what is listed above and in the HPI.      Objective:    BP 138/88   Pulse 78   Temp 98.1 F (36.7 C) (Oral)   Ht 5' 11.65" (1.82 m)   Wt 206 lb (93.4 kg)   SpO2 100%   BMI  28.21 kg/m   Wt Readings from Last 3 Encounters:  09/12/18 206 lb (93.4 kg)  02/27/18 209 lb 1 oz (94.8 kg)  09/03/17 212 lb (96.2 kg)    Physical Exam Vitals signs and nursing note reviewed.  Constitutional:      General: He is not in acute distress.    Appearance: He is well-developed.  HENT:     Head: Atraumatic.     Right Ear: Tympanic membrane and external ear normal.     Left Ear: Tympanic membrane and external ear normal.     Nose: Nose normal.     Mouth/Throat:     Mouth: Mucous membranes are moist.     Pharynx: Oropharynx is clear.  Eyes:     General: No scleral icterus.    Conjunctiva/sclera: Conjunctivae normal.     Pupils: Pupils are equal, round, and reactive to light.  Neck:     Musculoskeletal: Normal range of motion and neck supple.  Cardiovascular:     Rate and Rhythm: Normal rate and regular rhythm.     Heart sounds: Normal heart sounds. No murmur.  Pulmonary:     Effort: Pulmonary effort is normal. No respiratory distress.     Breath sounds: Normal breath sounds.  Abdominal:     General: Bowel sounds are normal. There is no distension.     Palpations: Abdomen is soft. There is no mass.     Tenderness: There is no abdominal tenderness. There is no guarding.  Genitourinary:    Prostate: Normal.  Musculoskeletal: Normal range of motion.        General: No tenderness.  Skin:    General: Skin is warm and dry.     Findings: No rash.  Neurological:     General: No focal deficit present.     Mental Status: He is alert and oriented to person, place, and time.     Deep Tendon Reflexes: Reflexes are normal and symmetric.  Psychiatric:        Mood and Affect: Mood normal.        Behavior: Behavior normal.        Thought Content: Thought content normal.         Judgment: Judgment normal.     Results for orders placed or performed in visit on 02/27/18  Comprehensive metabolic panel  Result Value Ref Range   Glucose 93 65 - 99 mg/dL   BUN 20 8 - 27 mg/dL   Creatinine, Ser 1.56 (H) 0.76 - 1.27 mg/dL   GFR calc non Af Amer 47 (L) >59 mL/min/1.73   GFR calc Af Amer 54 (L) >59 mL/min/1.73   BUN/Creatinine Ratio 13 10 - 24   Sodium 139 134 - 144 mmol/L   Potassium 3.9 3.5 - 5.2 mmol/L   Chloride 101 96 - 106 mmol/L   CO2 23 20 - 29 mmol/L   Calcium 9.6 8.6 - 10.2 mg/dL   Total Protein 6.6 6.0 - 8.5 g/dL   Albumin 4.7 3.6 - 4.8 g/dL   Globulin, Total 1.9 1.5 - 4.5 g/dL   Albumin/Globulin Ratio 2.5 (H) 1.2 - 2.2   Bilirubin Total 0.3 0.0 - 1.2 mg/dL   Alkaline Phosphatase 45 39 - 117 IU/L   AST 18 0 - 40 IU/L   ALT 17 0 - 44 IU/L  Lipid Panel w/o Chol/HDL Ratio  Result Value Ref Range   Cholesterol, Total 174 100 - 199 mg/dL   Triglycerides 199 (H) 0 - 149 mg/dL  HDL 34 (L) >39 mg/dL   VLDL Cholesterol Cal 40 5 - 40 mg/dL   LDL Calculated 100 (H) 0 - 99 mg/dL      Assessment & Plan:   Problem List Items Addressed This Visit      Cardiovascular and Mediastinum   Essential (primary) hypertension     Digestive   Acid reflux     Other   Hyperlipidemia       Discussed aspirin prophylaxis for myocardial infarction prevention   LABORATORY TESTING:  Health maintenance labs ordered today as discussed above.   The natural history of prostate cancer and ongoing controversy regarding screening and potential treatment outcomes of prostate cancer has been discussed with the patient. The meaning of a false positive PSA and a false negative PSA has been discussed. He indicates understanding of the limitations of this screening test and wishes not to proceed with screening PSA testing.   IMMUNIZATIONS:   - Tdap: Tetanus vaccination status reviewed: last tetanus booster within 10 years. - Influenza: Postponed to flu season -  Zostavax vaccine: Up to date  SCREENING: - Colonoscopy: Up to date  Discussed with patient purpose of the colonoscopy is to detect colon cancer at curable precancerous or early stages   PATIENT COUNSELING:    Sexuality: Discussed sexually transmitted diseases, partner selection, use of condoms, avoidance of unintended pregnancy  and contraceptive alternatives.   Advised to avoid cigarette smoking.  I discussed with the patient that most people either abstain from alcohol or drink within safe limits (<=14/week and <=4 drinks/occasion for males, <=7/weeks and <= 3 drinks/occasion for females) and that the risk for alcohol disorders and other health effects rises proportionally with the number of drinks per week and how often a drinker exceeds daily limits.  Discussed cessation/primary prevention of drug use and availability of treatment for abuse.   Diet: Encouraged to adjust caloric intake to maintain  or achieve ideal body weight, to reduce intake of dietary saturated fat and total fat, to limit sodium intake by avoiding high sodium foods and not adding table salt, and to maintain adequate dietary potassium and calcium preferably from fresh fruits, vegetables, and low-fat dairy products.    stressed the importance of regular exercise  Injury prevention: Discussed safety belts, safety helmets, smoke detector, smoking near bedding or upholstery.   Dental health: Discussed importance of regular tooth brushing, flossing, and dental visits.   Follow up plan: NEXT PREVENTATIVE PHYSICAL DUE IN 1 YEAR. No follow-ups on file.

## 2018-09-13 LAB — COMPREHENSIVE METABOLIC PANEL
ALT: 18 IU/L (ref 0–44)
AST: 25 IU/L (ref 0–40)
Albumin/Globulin Ratio: 2.1 (ref 1.2–2.2)
Albumin: 4.9 g/dL — ABNORMAL HIGH (ref 3.8–4.8)
Alkaline Phosphatase: 50 IU/L (ref 39–117)
BUN/Creatinine Ratio: 14 (ref 10–24)
BUN: 19 mg/dL (ref 8–27)
Bilirubin Total: 0.3 mg/dL (ref 0.0–1.2)
CO2: 24 mmol/L (ref 20–29)
Calcium: 9.9 mg/dL (ref 8.6–10.2)
Chloride: 99 mmol/L (ref 96–106)
Creatinine, Ser: 1.32 mg/dL — ABNORMAL HIGH (ref 0.76–1.27)
GFR calc Af Amer: 66 mL/min/{1.73_m2} (ref >59)
GFR calc non Af Amer: 57 mL/min/{1.73_m2} — ABNORMAL LOW (ref >59)
Globulin, Total: 2.3 g/dL (ref 1.5–4.5)
Glucose: 71 mg/dL (ref 65–99)
Potassium: 3.9 mmol/L (ref 3.5–5.2)
Sodium: 139 mmol/L (ref 134–144)
Total Protein: 7.2 g/dL (ref 6.0–8.5)

## 2018-09-13 LAB — LIPID PANEL W/O CHOL/HDL RATIO
Cholesterol, Total: 197 mg/dL (ref 100–199)
HDL: 36 mg/dL — ABNORMAL LOW (ref >39)
LDL Calculated: 130 mg/dL — ABNORMAL HIGH (ref 0–99)
Triglycerides: 157 mg/dL — ABNORMAL HIGH (ref 0–149)
VLDL Cholesterol Cal: 31 mg/dL (ref 5–40)

## 2018-09-13 LAB — VITAMIN D 25 HYDROXY (VIT D DEFICIENCY, FRACTURES): Vit D, 25-Hydroxy: 27.5 ng/mL — ABNORMAL LOW (ref 30.0–100.0)

## 2018-09-17 NOTE — Assessment & Plan Note (Signed)
Stable and under control with zoloft, continue current regimen

## 2018-09-17 NOTE — Assessment & Plan Note (Signed)
Off supplementation for several years, will recheck vit D level and restart medication if needed

## 2018-09-17 NOTE — Progress Notes (Signed)
BP 138/88   Pulse 78   Temp 98.1 F (36.7 C) (Oral)   Ht 5' 11.65" (1.82 m)   Wt 206 lb (93.4 kg)   SpO2 100%   BMI 28.21 kg/m    Subjective:    Patient ID: Chris Daugherty, male    DOB: [birth year], 63 y.o.   MRN: [id number]  HPI: Chris Daugherty is a 63 y.o. male  Chief Complaint  Patient presents with  . Annual Exam  . Health Maintenance    UTD: declined HIV&HepC   HTN - Has not been checking BP readings at home. Taking his medications faithfully without side effects. Denies CP, SOB, HAs, dizziness.   HLD - taking tricor without issue. Trying hard to watch what he eats and stay active. Denies claudication, CP, SOB.   Zoloft helping significantly with moods. Denies SI/HI, sleep issues, mood swings.   Prilosec for GERD helping, no breakthrough sxs.   Relevant past medical, surgical, family and social history reviewed and updated as indicated. Interim medical history since our last visit reviewed. Allergies and medications reviewed and updated.  Review of Systems  Per HPI unless specifically indicated above     Objective:    BP 138/88   Pulse 78   Temp 98.1 F (36.7 C) (Oral)   Ht 5' 11.65" (1.82 m)   Wt 206 lb (93.4 kg)   SpO2 100%   BMI 28.21 kg/m   Wt Readings from Last 3 Encounters:  09/12/18 206 lb (93.4 kg)  02/27/18 209 lb 1 oz (94.8 kg)  09/03/17 212 lb (96.2 kg)    Physical Exam Vitals signs and nursing note reviewed.  Constitutional:      Appearance: Normal appearance.  HENT:     Head: Atraumatic.  Eyes:     Extraocular Movements: Extraocular movements intact.     Conjunctiva/sclera: Conjunctivae normal.  Neck:     Musculoskeletal: Normal range of motion and neck supple.  Cardiovascular:     Rate and Rhythm: Normal rate and regular rhythm.  Pulmonary:     Effort: Pulmonary effort is normal.     Breath sounds: Normal breath sounds.  Musculoskeletal: Normal range of motion.  Skin:    General: Skin is warm and dry.  Neurological:   General: No focal deficit present.     Mental Status: He is oriented to person, place, and time.  Psychiatric:        Mood and Affect: Mood normal.        Thought Content: Thought content normal.        Judgment: Judgment normal.     Results for orders placed or performed in visit on 09/12/18  Comprehensive metabolic panel  Result Value Ref Range   Glucose 71 65 - 99 mg/dL   BUN 19 8 - 27 mg/dL   Creatinine, Ser 1.32 (H) 0.76 - 1.27 mg/dL   GFR calc non Af Amer 57 (L) >59 mL/min/1.73   GFR calc Af Amer 66 >59 mL/min/1.73   BUN/Creatinine Ratio 14 10 - 24   Sodium 139 134 - 144 mmol/L   Potassium 3.9 3.5 - 5.2 mmol/L   Chloride 99 96 - 106 mmol/L   CO2 24 20 - 29 mmol/L   Calcium 9.9 8.6 - 10.2 mg/dL   Total Protein 7.2 6.0 - 8.5 g/dL   Albumin 4.9 (H) 3.8 - 4.8 g/dL   Globulin, Total 2.3 1.5 - 4.5 g/dL   Albumin/Globulin Ratio 2.1 1.2 - 2.2   Bilirubin Total 0.3  0.0 - 1.2 mg/dL   Alkaline Phosphatase 50 39 - 117 IU/L   AST 25 0 - 40 IU/L   ALT 18 0 - 44 IU/L  Lipid Panel w/o Chol/HDL Ratio  Result Value Ref Range   Cholesterol, Total 197 100 - 199 mg/dL   Triglycerides 157 (H) 0 - 149 mg/dL   HDL 36 (L) >39 mg/dL   VLDL Cholesterol Cal 31 5 - 40 mg/dL   LDL Calculated 130 (H) 0 - 99 mg/dL  UA/M w/rflx Culture, Routine   Specimen: Urine   URINE  Result Value Ref Range   Specific Gravity, UA 1.015 1.005 - 1.030   pH, UA 5.5 5.0 - 7.5   Color, UA Yellow Yellow   Appearance Ur Clear Clear   Leukocytes,UA Negative Negative   Protein,UA Negative Negative/Trace   Glucose, UA Negative Negative   Ketones, UA Negative Negative   RBC, UA Negative Negative   Bilirubin, UA Negative Negative   Urobilinogen, Ur 0.2 0.2 - 1.0 mg/dL   Nitrite, UA Negative Negative  VITAMIN D 25 Hydroxy (Vit-D Deficiency, Fractures)  Result Value Ref Range   Vit D, 25-Hydroxy 27.5 (L) 30.0 - 100.0 ng/mL      Assessment & Plan:   Problem List Items Addressed This Visit      Cardiovascular  and Mediastinum   Essential (primary) hypertension - Primary    BPs stable and WNL, continue current regimen and good lifestyle modifications      Relevant Medications   amLODipine (NORVASC) 10 MG tablet   fenofibrate (TRICOR) 145 MG tablet   losartan (COZAAR) 100 MG tablet   Other Relevant Orders   Comprehensive metabolic panel (Completed)   UA/M w/rflx Culture, Routine (Completed)     Digestive   Acid reflux    Stable and under good control with prilosec. Continue current regimen      Relevant Medications   omeprazole (PRILOSEC) 20 MG capsule     Other   Anxiety    Stable and under control with zoloft, continue current regimen      Relevant Medications   sertraline (ZOLOFT) 100 MG tablet   Avitaminosis D    Off supplementation for several years, will recheck vit D level and restart medication if needed      Hyperlipidemia    Recheck lipids, adjust as needed. Continue current regimen      Relevant Medications   amLODipine (NORVASC) 10 MG tablet   fenofibrate (TRICOR) 145 MG tablet   losartan (COZAAR) 100 MG tablet   Other Relevant Orders   Lipid Panel w/o Chol/HDL Ratio (Completed)    Other Visit Diagnoses    Vitamin D deficiency       Relevant Orders   Vitamin D (25 hydroxy)       Follow up plan: Return in about 6 months (around 03/14/2019) for 6 month f/u.

## 2018-09-17 NOTE — Assessment & Plan Note (Signed)
BPs stable and WNL, continue current regimen and good lifestyle modifications

## 2018-09-17 NOTE — Assessment & Plan Note (Signed)
Stable and under good control with prilosec. Continue current regimen

## 2018-09-17 NOTE — Assessment & Plan Note (Signed)
Recheck lipids, adjust as needed. Continue current regimen 

## 2018-12-01 ENCOUNTER — Encounter: Payer: Self-pay | Admitting: Emergency Medicine

## 2018-12-01 ENCOUNTER — Emergency Department: Payer: Self-pay

## 2018-12-01 ENCOUNTER — Other Ambulatory Visit: Payer: Self-pay

## 2018-12-01 ENCOUNTER — Emergency Department
Admission: EM | Admit: 2018-12-01 | Discharge: 2018-12-01 | Disposition: A | Discharge status: Home / Self Care | Payer: Self-pay | Attending: Emergency Medicine | Admitting: Emergency Medicine

## 2018-12-01 DIAGNOSIS — Z79899 Other long term (current) drug therapy: Secondary | ICD-10-CM | POA: Insufficient documentation

## 2018-12-01 DIAGNOSIS — M79605 Pain in left leg: Secondary | ICD-10-CM | POA: Insufficient documentation

## 2018-12-01 DIAGNOSIS — I10 Essential (primary) hypertension: Secondary | ICD-10-CM | POA: Insufficient documentation

## 2018-12-01 DIAGNOSIS — Z87891 Personal history of nicotine dependence: Secondary | ICD-10-CM | POA: Insufficient documentation

## 2018-12-01 DIAGNOSIS — M7122 Synovial cyst of popliteal space [Baker], left knee: Secondary | ICD-10-CM | POA: Insufficient documentation

## 2018-12-01 MED ORDER — HYDROCODONE-ACETAMINOPHEN 5-325 MG PO TABS: 1.0000 | ORAL_TABLET | Freq: Four times a day (QID) | ORAL | 0 refills | Status: DC | PRN

## 2018-12-01 MED ORDER — NAPROXEN 500 MG PO TABS: 500.0000 mg | ORAL_TABLET | Freq: Two times a day (BID) | ORAL | 0 refills | Status: DC

## 2018-12-01 NOTE — ED Provider Notes (Signed)
[facility] Emergency Department Provider Note   ____________________________________________   First MD Initiated Contact with Patient 12/01/18 [phone]     (approximate)  I have reviewed the triage vital signs and the nursing notes.   HISTORY  Chief Complaint Knee Pain and Leg Pain   HPI Chris Daugherty is a 63 y.o. male presents to the ED with complaint of left lower extremity pain.  Patient states that he hit his left knee on the wood part of a couch 3 days ago and now has developed lower leg pain.  Patient states he has not taken any over-the-counter medication.  He is concerned for a DVT.  He denies any previous DVTs.  Patient states pain is increased with walking.  He rates his pain as 6 out of 10.     Past Medical History:  Diagnosis Date  . Acid reflux   . Allergic rhinitis   . Allergy   . Anxiety   . Depression   . Hyperlipidemia   . Hypertension   . Testicular hypofunction     Patient Active Problem List   Diagnosis Date Noted  . H/O tinea 09/03/2017  . Decreased calculated GFR 05/30/2017  . Essential (primary) hypertension   . Allergic rhinitis   . Elevated blood sugar 04/13/2016  . Seasonal allergies 12/09/2015  . Hyperlipidemia 08/19/2015  . Renal insufficiency 08/19/2015  . Insomnia 03/19/2015  . Hx of migraine headaches 12/11/2014  . 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 Encounter for long-term current use of medication 01/12/2014  . Anxiety 06/18/2013  . Acid reflux 06/18/2013  . 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 Avitaminosis D 06/18/2013  . Spermatocele 12/21/2011    Past Surgical History:  Procedure Laterality Date  . BASAL CELL CARCINOMA EXCISION     left side of face  . CHOLECYSTECTOMY    . COLONOSCOPY  04/30/2014  . GASTRIC FUNDOPLICATION    . INGUINAL HERNIA REPAIR    . PARTIAL NEPHRECTOMY Left   . VASECTOMY      Prior to Admission medications    Medication Sig Start Date End Date Taking? Authorizing Provider  amLODipine (NORVASC) 10 MG tablet Take 1 tablet (10 mg total) by mouth daily. 09/12/18   Volney American, PA-C  clotrimazole-betamethasone (LOTRISONE) cream Apply 1 application topically 2 (two) times daily. 09/04/17   Kathrine Haddock, NP  fenofibrate (TRICOR) 145 MG tablet Take 1 tablet (145 mg total) by mouth daily. 09/12/18   Volney American, PA-C  HYDROcodone-acetaminophen (NORCO/VICODIN) 5-325 MG tablet Take 1 tablet by mouth every 6 (six) hours as needed for moderate pain. 12/01/18   Johnn Hai, PA-C  losartan (COZAAR) 100 MG tablet Take 1 tablet (100 mg total) by mouth daily. 09/12/18   Volney American, PA-C  methocarbamol (ROBAXIN) 750 MG tablet TAKE ONE TABLET BY MOUTH TWICE A DAY 06/05/18   Volney American, PA-C  naproxen (NAPROSYN) 500 MG tablet Take 1 tablet (500 mg total) by mouth 2 (two) times daily with a meal. 12/01/18   Johnn Hai, PA-C  omeprazole (PRILOSEC) 20 MG capsule Take 1 capsule (20 mg total) by mouth daily. 09/12/18   Volney American, PA-C  promethazine (PHENERGAN) 25 MG tablet Take 1 tablet (25 mg total) by mouth every 6 (six) hours as needed for nausea or vomiting. 06/07/18   Volney American, PA-C  sertraline (ZOLOFT) 100 MG tablet Take 2 tablets (200 mg total) by  mouth daily. 09/12/18   Volney American, PA-C  sildenafil (REVATIO) 20 MG tablet Take 1 tablet (20 mg total) by mouth as needed. 05/30/17   Kathrine Haddock, NP  SUMAtriptan (IMITREX) 100 MG tablet Take 1 tablet (100 mg total) by mouth daily as needed. 09/12/18   Volney American, PA-C  triamcinolone cream (KENALOG) 0.1 % Apply 1 application topically once a week. 05/30/17   Kathrine Haddock, NP  Vitamin D, Cholecalciferol, 400 UNITS TABS Take 400 Units by mouth daily.  07/16/14   [provider]    Allergies Patient has no known allergies.  Family History  Problem Relation Age of Onset   . Cancer Maternal Grandfather 36       stomaCH  . Hypertension Mother     Social History Social History   Tobacco Use  . Smoking status: Former Smoker    Quit date: 11/13/1974    Years since quitting: 44.0  . Smokeless tobacco: Never Used  Substance Use Topics  . Alcohol use: No  . Drug use: No    Review of Systems Constitutional: No fever/chills Eyes: No visual changes. ENT: No sore throat. Cardiovascular: Denies chest pain. Respiratory: Denies shortness of breath. Musculoskeletal: Positive for left knee and left lower extremity pain. Skin: Positive for abrasion left knee. Neurological: Negative for headaches, focal weakness or numbness. ____________________________________________   PHYSICAL EXAM:  VITAL SIGNS: ED Triage Vitals  Enc Vitals Group     BP 12/01/18 0957 116/83     Pulse Rate 12/01/18 0957 89     Resp 12/01/18 0957 18     Temp 12/01/18 0957 98.1 F (36.7 C)     Temp Source 12/01/18 0957 Oral     SpO2 12/01/18 0957 98 %     Weight 12/01/18 0955 208 lb (94.3 kg)     Height 12/01/18 0955 6' (1.829 m)     Head Circumference --      Peak Flow --      Pain Score 12/01/18 0955 6     Pain Loc --      Pain Edu? --      Excl. in [place]? --    Constitutional: Alert and oriented. Well appearing and in no acute distress. Eyes: Conjunctivae are normal.  Head: Atraumatic. Neck: No stridor.   Cardiovascular: Normal rate, regular rhythm. Grossly normal heart sounds.  Good peripheral circulation. Respiratory: Normal respiratory effort.  No retractions. Lungs CTAB. Musculoskeletal: Examination of the left lower extremity there is no gross deformity.  There is minimal diffuse tenderness on palpation of the patella.  No effusion is noted.  Range of motion is without restriction.  There is diffuse tenderness on palpation of the posterior tib-fib without noted edema.  Homans sign was negative.  Neurologic:  Normal speech and language. No gross focal neurologic deficits  are appreciated. No gait instability. Skin:  Skin is warm, dry.  Superficial abrasion is noted to the left knee. Psychiatric: Mood and affect are normal. Speech and behavior are normal.  ____________________________________________   LABS (all labs ordered are listed, but only abnormal results are displayed)  Labs Reviewed - No data to display  RADIOLOGY   Official radiology report(s): Dg Tibia/fibula Left  Result Date: 12/01/2018 CLINICAL DATA:  Left lower leg pain after hitting it on a couch 3 days ago. EXAM: LEFT TIBIA AND FIBULA - 2 VIEW COMPARISON:  None. FINDINGS: There is no evidence of fracture or other focal bone lesions. Soft tissues are unremarkable. IMPRESSION: Negative. Electronically  Signed   By: Titus Dubin M.D.   On: 12/01/2018 10:31   US Venous Img Lower Unilateral Left  Result Date: 12/01/2018 CLINICAL DATA:  Pain and swelling 3 days after injury. EXAM: LEFT LOWER EXTREMITY VENOUS DOPPLER ULTRASOUND TECHNIQUE: Gray-scale sonography with graded compression, as well as color Doppler and duplex ultrasound were performed to evaluate the lower extremity deep venous systems from the level of the common femoral vein and including the common femoral, femoral, profunda femoral, popliteal and calf veins including the posterior tibial, peroneal and gastrocnemius veins when visible. The superficial great saphenous vein was also interrogated. Spectral Doppler was utilized to evaluate flow at rest and with distal augmentation maneuvers in the common femoral, femoral and popliteal veins. COMPARISON:  None. FINDINGS: Contralateral Common Femoral Vein: Respiratory phasicity is normal and symmetric with the symptomatic side. No evidence of thrombus. Normal compressibility. Common Femoral Vein: No evidence of thrombus. Normal compressibility, respiratory phasicity and response to augmentation. Saphenofemoral Junction: No evidence of thrombus. Normal compressibility and flow on color Doppler  imaging. Profunda Femoral Vein: No evidence of thrombus. Normal compressibility and flow on color Doppler imaging. Femoral Vein: No evidence of thrombus. Normal compressibility, respiratory phasicity and response to augmentation. Popliteal Vein: No evidence of thrombus. Normal compressibility, respiratory phasicity and response to augmentation. Calf Veins: No evidence of thrombus. Normal compressibility and flow on color Doppler imaging. Superficial Great Saphenous Vein: No evidence of thrombus. Normal compressibility. Venous Reflux:  None. Other Findings: There is a complex mass measuring 5.6 x 2.3 x 2.8 cm in the left popliteal fossa at the site of patient's pain. IMPRESSION: 1. No DVT identified. 2. The complex 5.6 x 2.3 x 2.8 cm mass in the left popliteal fossa most likely represents a complicated/complex Baker's cyst. Electronically Signed   By: Dorise Bullion III M.D   On: 12/01/2018 12:44    ____________________________________________   PROCEDURES  Procedure(s) performed (including Critical Care):  Procedures   ____________________________________________   INITIAL IMPRESSION / ASSESSMENT AND PLAN / ED COURSE  As part of my medical decision making, I reviewed the following data within the electronic MEDICAL RECORD NUMBER Notes from prior ED visits and [place] Controlled Substance Database  63 year old male presents to the ED with complaint of left leg pain.  Patient states that he hit his left knee 3 days ago and has continued to have pain since that time.  He denies any previous knee problems.  No prior DVTs but patient is concerned as he googled it.  On exam there is generalized tenderness on palpation of the anterior and posterior tib-fib.  There is a superficial abrasion noted at the knee.  Patient is able to ambulate without any assistance.  X-ray was negative for any bony injury but ultrasound of the left lower extremity did show a Baker's cyst to the left knee.  Patient was made aware and  given Ortho follow-up.  He was started on naproxen 500 mg twice daily and Norco if needed for moderate to severe pain.  ____________________________________________   FINAL CLINICAL IMPRESSION(S) / ED DIAGNOSES  Final diagnoses:  Baker's cyst of knee, left     ED Discharge Orders         Ordered    naproxen (NAPROSYN) 500 MG tablet  2 times daily with meals     12/01/18 1259    HYDROcodone-acetaminophen (NORCO/VICODIN) 5-325 MG tablet  Every 6 hours PRN     12/01/18 1259  Note:  This document was prepared using Dragon voice recognition software and may include unintentional dictation errors.    Johnn Hai, PA-C 12/01/18 1546    Harvest Dark, MD 12/04/18 2306

## 2018-12-01 NOTE — ED Notes (Signed)
Pt verbalized understanding of discharge instructions. NAD at this time. 

## 2018-12-01 NOTE — Discharge Instructions (Signed)
Follow-up with Dr. Sabra Heck who is the orthopedist on call if any continued problems with your knee after starting on the anti-inflammatories.  Naproxen is twice a day with food.  The Norco can be taken every 6 hours if needed for pain.  You may also just take it at bedtime if needed.  Be aware that this medication could cause drowsiness and increase your risk for falling.  You may also use ice to the back of your knee as needed for discomfort.

## 2018-12-01 NOTE — ED Triage Notes (Signed)
Pt arrived via POV with reports of left knee pain after hitting on the wood part of a couch 3 days ago, pt states lower leg pain on the left as well. Pt ambulatory without difficulty.

## 2019-01-05 ENCOUNTER — Other Ambulatory Visit: Payer: Self-pay | Admitting: Family Medicine

## 2019-01-05 DIAGNOSIS — Z8669 Personal history of other diseases of the nervous system and sense organs: Secondary | ICD-10-CM

## 2019-01-05 DIAGNOSIS — K219 Gastro-esophageal reflux disease without esophagitis: Secondary | ICD-10-CM

## 2019-01-20 ENCOUNTER — Other Ambulatory Visit: Payer: Self-pay

## 2019-01-20 ENCOUNTER — Ambulatory Visit (INDEPENDENT_AMBULATORY_CARE_PROVIDER_SITE_OTHER): Payer: Self-pay | Admitting: Family Medicine

## 2019-01-20 ENCOUNTER — Encounter: Payer: Self-pay | Admitting: Family Medicine

## 2019-01-20 VITALS — BP 132/86 | HR 81 | Temp 98.2°F

## 2019-01-20 DIAGNOSIS — H608X3 Other otitis externa, bilateral: Secondary | ICD-10-CM

## 2019-01-20 MED ORDER — FLUOCINOLONE ACETONIDE 0.01 % OT OIL: 1.0000 [drp] | TOPICAL_OIL | Freq: Two times a day (BID) | OTIC | 0 refills | Status: DC | PRN

## 2019-01-20 NOTE — Progress Notes (Signed)
BP 132/86   Pulse 81   Temp 98.2 F (36.8 C)   SpO2 98%    Subjective:    Patient ID: Chris Daugherty, male    DOB: June 17, 1955, 63 y.o.   MRN: [id number]  HPI: [name] is a 63 y.o. male  Chief Complaint  Patient presents with  . Ear Problem    Patient states that his ear canal will flake at times, he would like a refillon Clotrimazole-bethamethasone cream   Itching, flaking ear canals b/l and left canal this week bleeding some. This has been an intermittent issue for him in the past. Has been given clotrimazole betamethasone cream for it in the past which does seem to help but he's running out. Denies fevers, chills, ear pain, drainage, muffled hearing.   Relevant past medical, surgical, family and social history reviewed and updated as indicated. Interim medical history since our last visit reviewed. Allergies and medications reviewed and updated.  Review of Systems  Per HPI unless specifically indicated above     Objective:    BP 132/86   Pulse 81   Temp 98.2 F (36.8 C)   SpO2 98%   Wt Readings from Last 3 Encounters:  12/01/18 208 lb (94.3 kg)  09/12/18 206 lb (93.4 kg)  02/27/18 209 lb 1 oz (94.8 kg)    Physical Exam Vitals signs and nursing note reviewed.  Constitutional:      Appearance: Normal appearance.  HENT:     Head: Atraumatic.     Ears:     Comments: B/l EACs erythematous and flaking/dry Left worse than right Eyes:     Extraocular Movements: Extraocular movements intact.     Conjunctiva/sclera: Conjunctivae normal.  Neck:     Musculoskeletal: Normal range of motion and neck supple.  Cardiovascular:     Rate and Rhythm: Normal rate and regular rhythm.  Pulmonary:     Effort: Pulmonary effort is normal.     Breath sounds: Normal breath sounds.  Musculoskeletal: Normal range of motion.  Skin:    General: Skin is warm and dry.  Neurological:     General: No focal deficit present.     Mental Status: He is oriented to person, place, and  time.  Psychiatric:        Mood and Affect: Mood normal.        Thought Content: Thought content normal.        Judgment: Judgment normal.     Results for orders placed or performed in visit on 09/12/18  Comprehensive metabolic panel  Result Value Ref Range   Glucose 71 65 - 99 mg/dL   BUN 19 8 - 27 mg/dL   Creatinine, Ser 1.32 (H) 0.76 - 1.27 mg/dL   GFR calc non Af Amer 57 (L) >59 mL/min/1.73   GFR calc Af Amer 66 >59 mL/min/1.73   BUN/Creatinine Ratio 14 10 - 24   Sodium 139 134 - 144 mmol/L   Potassium 3.9 3.5 - 5.2 mmol/L   Chloride 99 96 - 106 mmol/L   CO2 24 20 - 29 mmol/L   Calcium 9.9 8.6 - 10.2 mg/dL   Total Protein 7.2 6.0 - 8.5 g/dL   Albumin 4.9 (H) 3.8 - 4.8 g/dL   Globulin, Total 2.3 1.5 - 4.5 g/dL   Albumin/Globulin Ratio 2.1 1.2 - 2.2   Bilirubin Total 0.3 0.0 - 1.2 mg/dL   Alkaline Phosphatase 50 39 - 117 IU/L   AST 25 0 - 40 IU/L   ALT  18 0 - 44 IU/L  Lipid Panel w/o Chol/HDL Ratio  Result Value Ref Range   Cholesterol, Total 197 100 - 199 mg/dL   Triglycerides 157 (H) 0 - 149 mg/dL   HDL 36 (L) >39 mg/dL   VLDL Cholesterol Cal 31 5 - 40 mg/dL   LDL Calculated 130 (H) 0 - 99 mg/dL  UA/M w/rflx Culture, Routine   Specimen: Urine   URINE  Result Value Ref Range   Specific Gravity, UA 1.015 1.005 - 1.030   pH, UA 5.5 5.0 - 7.5   Color, UA Yellow Yellow   Appearance Ur Clear Clear   Leukocytes,UA Negative Negative   Protein,UA Negative Negative/Trace   Glucose, UA Negative Negative   Ketones, UA Negative Negative   RBC, UA Negative Negative   Bilirubin, UA Negative Negative   Urobilinogen, Ur 0.2 0.2 - 1.0 mg/dL   Nitrite, UA Negative Negative  VITAMIN D 25 Hydroxy (Vit-D Deficiency, Fractures)  Result Value Ref Range   Vit D, 25-Hydroxy 27.5 (L) 30.0 - 100.0 ng/mL      Assessment & Plan:   Problem List Items Addressed This Visit    None    Visit Diagnoses    Chronic eczematous otitis externa of both ears    -  Primary   Mild, but  intermittenty flaring. Will tx with fluocinolone oil drops prn, use unscented cream in canal BID for maintenance       Follow up plan: Return for as scheduled.

## 2019-03-05 ENCOUNTER — Other Ambulatory Visit: Payer: Self-pay | Admitting: Family Medicine

## 2019-03-05 DIAGNOSIS — Z8669 Personal history of other diseases of the nervous system and sense organs: Secondary | ICD-10-CM

## 2019-03-05 NOTE — Telephone Encounter (Signed)
Requested medication (s) are due for refill today: yes  Requested medication (s) are on the active medication list: yes  Last refill:  06/13/2018  Future visit scheduled: yes  Notes to clinic:  Refill cannot be delegated    Requested Prescriptions  Pending Prescriptions Disp Refills   promethazine (PHENERGAN) 25 MG tablet [Pharmacy Med Name: PROMETHAZINE 25 MG TABLET] 270 tablet 0    Sig: TAKE ONE TABLET BY MOUTH EVERY 6 HOURS AS NEEDED FOR NAUSEA AND VOMITING     Not Delegated - Gastroenterology: Antiemetics Failed - 03/05/2019  9:40 AM      Failed - This refill cannot be delegated      Passed - Valid encounter within last 6 months    Recent Outpatient Visits          1 month ago Chronic eczematous otitis externa of both ears   [facility] Volney American, [place]   5 months ago Essential (primary) hypertension   [facility], [place], [place]   1 year ago Hyperlipidemia, unspecified hyperlipidemia type   [facility], Lilia Argue, [place]   1 year ago H/O tinea   [facility], Jeannette How, [place]   1 year ago Medication monitoring encounter   [facility] Kathrine Haddock, NP      Future Appointments            In 1 week Orene Desanctis, Lilia Argue, [place], [facility]

## 2019-03-18 ENCOUNTER — Ambulatory Visit (INDEPENDENT_AMBULATORY_CARE_PROVIDER_SITE_OTHER): Payer: Self-pay | Admitting: Family Medicine

## 2019-03-18 ENCOUNTER — Other Ambulatory Visit: Payer: Self-pay

## 2019-03-18 ENCOUNTER — Encounter: Payer: Self-pay | Admitting: Family Medicine

## 2019-03-18 VITALS — BP 133/83 | HR 72 | Temp 97.9°F | Ht 71.6 in

## 2019-03-18 DIAGNOSIS — I129 Hypertensive chronic kidney disease with stage 1 through stage 4 chronic kidney disease, or unspecified chronic kidney disease: Secondary | ICD-10-CM

## 2019-03-18 DIAGNOSIS — E785 Hyperlipidemia, unspecified: Secondary | ICD-10-CM

## 2019-03-18 DIAGNOSIS — G43009 Migraine without aura, not intractable, without status migrainosus: Secondary | ICD-10-CM

## 2019-03-18 DIAGNOSIS — F419 Anxiety disorder, unspecified: Secondary | ICD-10-CM

## 2019-03-18 NOTE — Assessment & Plan Note (Signed)
Stable and well controlled on prn imitrex. Continue current regimen

## 2019-03-18 NOTE — Assessment & Plan Note (Signed)
Stable and under good control, continue current regimen 

## 2019-03-18 NOTE — Progress Notes (Signed)
BP 133/83   Pulse 72   Temp 97.9 F (36.6 C) (Temporal)   Ht 5' 11.6" (1.819 m)   BMI 28.53 kg/m    Subjective:    Patient ID: Chris Daugherty, male    DOB: 07/28/1955, 63 y.o.   MRN: [id number]  HPI: Chris Daugherty is a 63 y.o. male  Chief Complaint  Patient presents with  . Hypertension  . Hyperlipidemia    . This visit was completed via WebEx due to the restrictions of the COVID-19 pandemic. All issues as above were discussed and addressed. Physical exam was done as above through visual confirmation on WebEx. If it was felt that the patient should be evaluated in the office, they were directed there. The patient verbally consented to this visit. . Location of the patient: home . Location of the provider: home . Those involved with this call:  . Provider: Merrie Roof, PA-C . CMA: Lesle Chris, [place] . Front Desk/Registration: Jill Side  . Time spent on call: 20 minutes with patient face to face via video conference. More than 50% of this time was spent in counseling and coordination of care. 5 minutes total spent in review of patient's record and preparation of their chart. I verified patient identity using two factors (patient name and date of birth). Patient consents verbally to being seen via telemedicine visit today.   Patient here today for 6 month f/u chronic conditions. Doing well, no new concerns per patient. Taking his medicines faithfully without side effects.   HTN - home BPs running 120-130/70s-80s. Denies CP, SOB, HAs, dizziness. Trying to eat well and stay active.   HLD - on tricor, doing well. Tries to eat a healthy diet. No claudication, DOE.   Migraines - stable on prn imitrex, with phenergan if needed for bad migraines.   On zoloft for anxiety which he states keeps things balanced and feeling well. Denies SI/HI, mood swings, sleep or appetite issues.   Depression screen [facility] 2/9 09/12/2018 02/27/2018 09/03/2017  Decreased Interest 0 0 0  Down, Depressed,  Hopeless 0 0 0  PHQ - 2 Score 0 0 0  Altered sleeping 0 - -  Tired, decreased energy 0 - -  Change in appetite 0 - -  Feeling bad or failure about yourself  0 - -  Trouble concentrating 0 - -  Moving slowly or fidgety/restless 0 - -  Suicidal thoughts 0 - -  PHQ-9 Score 0 - -  Difficult doing work/chores Not difficult at all - -   GAD 7 : Generalized Anxiety Score 03/18/2019 09/12/2018 02/27/2018  Nervous, Anxious, on Edge 0 0 0  Control/stop worrying 0 0 0  Worry too much - different things 0 0 0  Trouble relaxing 0 0 0  Restless 0 0 0  Easily annoyed or irritable 0 0 0  Afraid - awful might happen 0 0 0  Total GAD 7 Score 0 0 0  Anxiety Difficulty - Not difficult at all -     Relevant past medical, surgical, family and social history reviewed and updated as indicated. Interim medical history since our last visit reviewed. Allergies and medications reviewed and updated.  Review of Systems  Per HPI unless specifically indicated above     Objective:    BP 133/83   Pulse 72   Temp 97.9 F (36.6 C) (Temporal)   Ht 5' 11.6" (1.819 m)   BMI 28.53 kg/m   Wt Readings from Last 3 Encounters:  12/01/18 208 lb (  94.3 kg)  09/12/18 206 lb (93.4 kg)  02/27/18 209 lb 1 oz (94.8 kg)    Physical Exam Vitals and nursing note reviewed.  Constitutional:      General: He is not in acute distress.    Appearance: Normal appearance.  HENT:     Head: Atraumatic.     Right Ear: External ear normal.     Left Ear: External ear normal.     Nose: Nose normal. No congestion.     Mouth/Throat:     Mouth: Mucous membranes are moist.     Pharynx: Oropharynx is clear.  Eyes:     Extraocular Movements: Extraocular movements intact.     Conjunctiva/sclera: Conjunctivae normal.  Pulmonary:     Effort: Pulmonary effort is normal. No respiratory distress.  Musculoskeletal:        General: Normal range of motion.     Cervical back: Normal range of motion.  Skin:    General: Skin is dry.       Findings: No erythema or rash.  Neurological:     Mental Status: He is oriented to person, place, and time.  Psychiatric:        Mood and Affect: Mood normal.        Thought Content: Thought content normal.        Judgment: Judgment normal.     Results for orders placed or performed in visit on 09/12/18  Comprehensive metabolic panel  Result Value Ref Range   Glucose 71 65 - 99 mg/dL   BUN 19 8 - 27 mg/dL   Creatinine, Ser 1.32 (H) 0.76 - 1.27 mg/dL   GFR calc non Af Amer 57 (L) >59 mL/min/1.73   GFR calc Af Amer 66 >59 mL/min/1.73   BUN/Creatinine Ratio 14 10 - 24   Sodium 139 134 - 144 mmol/L   Potassium 3.9 3.5 - 5.2 mmol/L   Chloride 99 96 - 106 mmol/L   CO2 24 20 - 29 mmol/L   Calcium 9.9 8.6 - 10.2 mg/dL   Total Protein 7.2 6.0 - 8.5 g/dL   Albumin 4.9 (H) 3.8 - 4.8 g/dL   Globulin, Total 2.3 1.5 - 4.5 g/dL   Albumin/Globulin Ratio 2.1 1.2 - 2.2   Bilirubin Total 0.3 0.0 - 1.2 mg/dL   Alkaline Phosphatase 50 39 - 117 IU/L   AST 25 0 - 40 IU/L   ALT 18 0 - 44 IU/L  Lipid Panel w/o Chol/HDL Ratio  Result Value Ref Range   Cholesterol, Total 197 100 - 199 mg/dL   Triglycerides 157 (H) 0 - 149 mg/dL   HDL 36 (L) >39 mg/dL   VLDL Cholesterol Cal 31 5 - 40 mg/dL   LDL Calculated 130 (H) 0 - 99 mg/dL  UA/M w/rflx Culture, Routine   Specimen: Urine   URINE  Result Value Ref Range   Specific Gravity, UA 1.015 1.005 - 1.030   pH, UA 5.5 5.0 - 7.5   Color, UA Yellow Yellow   Appearance Ur Clear Clear   Leukocytes,UA Negative Negative   Protein,UA Negative Negative/Trace   Glucose, UA Negative Negative   Ketones, UA Negative Negative   RBC, UA Negative Negative   Bilirubin, UA Negative Negative   Urobilinogen, Ur 0.2 0.2 - 1.0 mg/dL   Nitrite, UA Negative Negative  VITAMIN D 25 Hydroxy (Vit-D Deficiency, Fractures)  Result Value Ref Range   Vit D, 25-Hydroxy 27.5 (L) 30.0 - 100.0 ng/mL      Assessment & Plan:  Problem List Items Addressed This Visit       Cardiovascular and Mediastinum   Migraine    Stable and well controlled on prn imitrex. Continue current regimen        Genitourinary   Hypertensive renal disease - Primary    BPs stable and WNL, continue current regimen        Other   Anxiety    Stable and under good control, continue current regimen      Hyperlipidemia    Chol just above goal previously, will continue working on  Diet and activity changes and recheck in 6 months          Follow up plan: Return in about 6 months (around 09/16/2019) for 6 month f/u.

## 2019-03-18 NOTE — Assessment & Plan Note (Signed)
Chol just above goal previously, will continue working on  Diet and activity changes and recheck in 6 months

## 2019-03-18 NOTE — Assessment & Plan Note (Signed)
BPs stable and WNL, continue current regimen 

## 2019-03-31 ENCOUNTER — Other Ambulatory Visit: Payer: Self-pay | Admitting: Family Medicine

## 2019-03-31 DIAGNOSIS — I10 Essential (primary) hypertension: Secondary | ICD-10-CM

## 2019-04-04 ENCOUNTER — Other Ambulatory Visit: Payer: Self-pay | Admitting: Family Medicine

## 2019-04-04 DIAGNOSIS — I10 Essential (primary) hypertension: Secondary | ICD-10-CM

## 2019-04-04 DIAGNOSIS — E785 Hyperlipidemia, unspecified: Secondary | ICD-10-CM

## 2019-04-05 NOTE — Telephone Encounter (Signed)
Requested medication (s) are due for refill today: no  Requested medication (s) are on the active medication list: yes  Last refill:  09/12/2018  Future visit scheduled: yes  Notes to clinic:  antilipid fibric acid derivatives failed  Requested Prescriptions  Pending Prescriptions Disp Refills   fenofibrate (TRICOR) 145 MG tablet [Pharmacy Med Name: FENOFIBRATE 145 MG TABLET] 90 tablet 0    Sig: TAKE ONE TABLET BY MOUTH DAILY      Cardiovascular:  Antilipid - Fibric Acid Derivatives Failed - 04/04/2019 10:46 AM      Failed - LDL in normal range and within 360 days    LDL Calculated  Date Value Ref Range Status  09/12/2018 130 (H) 0 - 99 mg/dL Final          Failed - HDL in normal range and within 360 days    HDL  Date Value Ref Range Status  09/12/2018 36 (L) >39 mg/dL Final          Failed - Triglycerides in normal range and within 360 days    Triglycerides  Date Value Ref Range Status  09/12/2018 157 (H) 0 - 149 mg/dL Final          Failed - ALT in normal range and within 180 days    ALT  Date Value Ref Range Status  09/12/2018 18 0 - 44 IU/L Final          Failed - AST in normal range and within 180 days    AST  Date Value Ref Range Status  09/12/2018 25 0 - 40 IU/L Final          Failed - Cr in normal range and within 180 days    Creatinine, Ser  Date Value Ref Range Status  09/12/2018 1.32 (H) 0.76 - 1.27 mg/dL Final          Failed - eGFR in normal range and within 180 days    GFR calc Af Amer  Date Value Ref Range Status  09/12/2018 66 >59 mL/min/1.73 Final   GFR calc non Af Amer  Date Value Ref Range Status  09/12/2018 57 (L) >59 mL/min/1.73 Final          Passed - Total Cholesterol in normal range and within 360 days    Cholesterol, Total  Date Value Ref Range Status  09/12/2018 197 100 - 199 mg/dL Final          Passed - Valid encounter within last 12 months    Recent Outpatient Visits           2 weeks ago Hypertensive renal  disease   [facility] Volney American, PA-C   2 months ago Chronic eczematous otitis externa of both ears   [facility] Volney American, [place]   6 months ago Essential (primary) hypertension   [place], [place], [place]   1 year ago Hyperlipidemia, unspecified hyperlipidemia type   [facility] Volney American, [place]   1 year ago H/O tinea   [facility] [place], Jeannette How, MD       Future Appointments             In 5 months O[facility] Desanctis, Lilia Argue, PA-C [place], [facility]             Signed Prescriptions Disp Refills   sertraline (ZOLOFT) 100 MG tablet 180 tablet 0    Sig: TAKE TWO TABLETS BY MOUTH DAILY  Psychiatry:  Antidepressants - SSRI Passed - 04/04/2019 10:46 AM      Passed - Valid encounter within last 6 months    Recent Outpatient Visits           2 weeks ago Hypertensive renal disease   [facility] Volney American, [place]   2 months ago Chronic eczematous otitis externa of both ears   [facility] Merrie Roof [place], [place]   6 months ago Essential (primary) hypertension   [place], [place], [place]   1 year ago Hyperlipidemia, unspecified hyperlipidemia type   [facility], Lilia Argue, [place]   1 year ago H/O tinea   [place], [place]       Future Appointments             In 5 months Orene Desanctis, Lilia Argue, PA-C [place], [facility]              amLODipine (NORVASC) 10 MG tablet 90 tablet 0    Sig: TAKE ONE TABLET BY MOUTH DAILY      Cardiovascular:  Calcium Channel Blockers Passed - 04/04/2019 10:46 AM      Passed - Last BP in normal range    BP Readings from Last 1 Encounters:  03/18/19 133/83          Passed - Valid encounter within last 6 months    Recent Outpatient Visits           2 weeks ago Hypertensive renal  disease   [facility] Volney American, [place]   2 months ago Chronic eczematous otitis externa of both ears   [place], [place], [place]   6 months ago Essential (primary) hypertension   [place], [place], [place]   1 year ago Hyperlipidemia, unspecified hyperlipidemia type   [facility], Lilia Argue, [place]   1 year ago H/O tinea   [place], MD       Future Appointments             In 5 months Orene Desanctis, Lilia Argue, [place], [place]

## 2019-04-07 NOTE — Telephone Encounter (Signed)
Routing to provider  

## 2019-05-28 ENCOUNTER — Other Ambulatory Visit: Payer: Self-pay | Admitting: Family Medicine

## 2019-05-28 DIAGNOSIS — Z8669 Personal history of other diseases of the nervous system and sense organs: Secondary | ICD-10-CM

## 2019-05-28 NOTE — Telephone Encounter (Signed)
Refill requests  for METHOCARBAMOL 750 MG TABLET and PROMETHAZINE 25 MG TABLET. Neither med can be delegated.

## 2019-05-28 NOTE — Telephone Encounter (Signed)
Patient last seen 03/18/19 and has appointment 09/29/19

## 2019-06-20 ENCOUNTER — Ambulatory Visit: Payer: Self-pay | Attending: Internal Medicine

## 2019-06-20 DIAGNOSIS — Z23 Encounter for immunization: Secondary | ICD-10-CM

## 2019-06-20 NOTE — Progress Notes (Signed)
   Covid-19 Vaccination Clinic  Name:  Chris Daugherty    MRN: [id number] DOB: 1955/04/29  06/20/2019  [name] was observed post Covid-19 immunization for 15 minutes without incident. He was provided with Vaccine Information Sheet and instruction to access the V-Safe system.   [name] was instructed to call 911 with any severe reactions post vaccine: Marland Kitchen Difficulty breathing  . Swelling of face and throat  . A fast heartbeat  . A bad rash all over body  . Dizziness and weakness   Immunizations Administered    Name Date Dose VIS Date Route   Pfizer COVID-19 Vaccine 06/20/2019  8:53 AM 0.3 mL 03/14/2019 Intramuscular   Manufacturer: [place]   Lot: [id number]   [place]: [id number]

## 2019-06-25 ENCOUNTER — Other Ambulatory Visit: Payer: Self-pay | Admitting: Family Medicine

## 2019-06-25 DIAGNOSIS — I10 Essential (primary) hypertension: Secondary | ICD-10-CM

## 2019-07-15 ENCOUNTER — Ambulatory Visit: Payer: Self-pay

## 2019-07-15 ENCOUNTER — Ambulatory Visit: Payer: Self-pay | Attending: Internal Medicine

## 2019-07-15 DIAGNOSIS — Z23 Encounter for immunization: Secondary | ICD-10-CM

## 2019-07-15 NOTE — Progress Notes (Signed)
   Covid-19 Vaccination Clinic  Name:  Rodman Gronlund    MRN: [id number] DOB: 1955-05-26  07/15/2019  Mr. Tagliaferri was observed post Covid-19 immunization for 15 minutes without incident. He was provided with Vaccine Information Sheet and instruction to access the V-Safe system.   Mr. Hilyard was instructed to call 911 with any severe reactions post vaccine: Marland Kitchen Difficulty breathing  . Swelling of face and throat  . A fast heartbeat  . A bad rash all over body  . Dizziness and weakness   Immunizations Administered    Name Date Dose VIS Date Route   Pfizer COVID-19 Vaccine 07/15/2019 10:26 AM 0.3 mL 03/14/2019 Intramuscular   Manufacturer: [place]   Lot: [id number]   [place]: [id number]

## 2019-08-12 ENCOUNTER — Other Ambulatory Visit: Payer: Self-pay | Admitting: Family Medicine

## 2019-08-12 NOTE — Telephone Encounter (Signed)
Routing to provider  

## 2019-08-12 NOTE — Telephone Encounter (Signed)
Requested medication (s) are due for refill today: no  Requested medication (s) are on the active medication list: yes  Last refill: 05/30/2017  Future visit scheduled: yes  Notes to clinic: this script is expired and patient would like to have refilled    Requested Prescriptions  Pending Prescriptions Disp Refills   triamcinolone cream (KENALOG) 0.1 % 30 g 0    Sig: Apply 1 application topically once a week.      Dermatology:  Corticosteroids Passed - 08/12/2019  1:49 PM      Passed - Valid encounter within last 12 months    Recent Outpatient Visits           4 months ago Hypertensive renal disease   [facility] Volney American, [place]   6 months ago Chronic eczematous otitis externa of both ears   [facility] Merrie Roof [place], [place]   11 months ago Essential (primary) hypertension   [facility] Merrie Roof [place], [place]   1 year ago Hyperlipidemia, unspecified hyperlipidemia type   [facility], Lilia Argue, [place]   1 year ago H/O tinea   [place], MD       Future Appointments             In 1 month Orene Desanctis, Lilia Argue, [place], [place]

## 2019-08-12 NOTE — Telephone Encounter (Signed)
Pt request refill  triamcinolone cream (KENALOG) 0.1 %  Pt states he does not use often, and this has been a while since he refilled.  [place], [place] Phone:  [phone]  Fax:  [phone]

## 2019-08-13 MED ORDER — TRIAMCINOLONE ACETONIDE 0.1 % EX CREA: 1.0000 "application " | TOPICAL_CREAM | CUTANEOUS | 0 refills | Status: DC

## 2019-09-27 ENCOUNTER — Other Ambulatory Visit: Payer: Self-pay | Admitting: Family Medicine

## 2019-09-27 DIAGNOSIS — E785 Hyperlipidemia, unspecified: Secondary | ICD-10-CM

## 2019-09-27 NOTE — Telephone Encounter (Signed)
Pt refilled with enough med to get to appt Monday=3 tabs Pt is overdue for bloodwork Requested Prescriptions  Pending Prescriptions Disp Refills   fenofibrate (TRICOR) 145 MG tablet [Pharmacy Med Name: FENOFIBRATE 145 MG TABLET] 3 tablet 0    Sig: TAKE ONE TABLET BY MOUTH DAILY     Cardiovascular:  Antilipid - Fibric Acid Derivatives Failed - 09/27/2019  9:48 AM      Failed - Total Cholesterol in normal range and within 360 days    Cholesterol, Total  Date Value Ref Range Status  09/12/2018 197 100 - 199 mg/dL Final         Failed - LDL in normal range and within 360 days    LDL Calculated  Date Value Ref Range Status  09/12/2018 130 (H) 0 - 99 mg/dL Final         Failed - HDL in normal range and within 360 days    HDL  Date Value Ref Range Status  09/12/2018 36 (L) >39 mg/dL Final         Failed - Triglycerides in normal range and within 360 days    Triglycerides  Date Value Ref Range Status  09/12/2018 157 (H) 0 - 149 mg/dL Final         Failed - ALT in normal range and within 180 days    ALT  Date Value Ref Range Status  09/12/2018 18 0 - 44 IU/L Final         Failed - AST in normal range and within 180 days    AST  Date Value Ref Range Status  09/12/2018 25 0 - 40 IU/L Final         Failed - Cr in normal range and within 180 days    Creatinine, Ser  Date Value Ref Range Status  09/12/2018 1.32 (H) 0.76 - 1.27 mg/dL Final         Failed - eGFR in normal range and within 180 days    GFR calc Af Amer  Date Value Ref Range Status  09/12/2018 66 >59 mL/min/1.73 Final   GFR calc non Af Amer  Date Value Ref Range Status  09/12/2018 57 (L) >59 mL/min/1.73 Final         Passed - Valid encounter within last 12 months    Recent Outpatient Visits          6 months ago Hypertensive renal disease   [facility] Volney American, PA-C   8 months ago Chronic eczematous otitis externa of both ears   [facility] Volney American, [place]   1 year ago Essential (primary) hypertension   [place], [place], [place]   1 year ago Hyperlipidemia, unspecified hyperlipidemia type   [facility] Volney American, [place]   2 years ago H/O tinea   [place], Jeannette How, MD      Future Appointments            In 2 days Volney American, PA-C [facility], [place]

## 2019-09-29 ENCOUNTER — Encounter: Payer: Self-pay | Admitting: Family Medicine

## 2019-09-29 ENCOUNTER — Other Ambulatory Visit: Payer: Self-pay

## 2019-09-29 ENCOUNTER — Ambulatory Visit (INDEPENDENT_AMBULATORY_CARE_PROVIDER_SITE_OTHER): Payer: Self-pay | Admitting: Family Medicine

## 2019-09-29 VITALS — BP 121/78 | HR 76 | Temp 98.1°F | Wt 201.0 lb

## 2019-09-29 DIAGNOSIS — I10 Essential (primary) hypertension: Secondary | ICD-10-CM

## 2019-09-29 DIAGNOSIS — I129 Hypertensive chronic kidney disease with stage 1 through stage 4 chronic kidney disease, or unspecified chronic kidney disease: Secondary | ICD-10-CM

## 2019-09-29 DIAGNOSIS — Z8669 Personal history of other diseases of the nervous system and sense organs: Secondary | ICD-10-CM

## 2019-09-29 DIAGNOSIS — F419 Anxiety disorder, unspecified: Secondary | ICD-10-CM

## 2019-09-29 DIAGNOSIS — K219 Gastro-esophageal reflux disease without esophagitis: Secondary | ICD-10-CM

## 2019-09-29 DIAGNOSIS — E785 Hyperlipidemia, unspecified: Secondary | ICD-10-CM

## 2019-09-29 DIAGNOSIS — H9202 Otalgia, left ear: Secondary | ICD-10-CM

## 2019-09-29 MED ORDER — SERTRALINE HCL 100 MG PO TABS: 200.0000 mg | ORAL_TABLET | Freq: Every day | ORAL | 1 refills | Status: DC

## 2019-09-29 MED ORDER — FENOFIBRATE 145 MG PO TABS: 145.0000 mg | ORAL_TABLET | Freq: Every day | ORAL | 1 refills | Status: DC

## 2019-09-29 MED ORDER — AMLODIPINE BESYLATE 10 MG PO TABS: 10.0000 mg | ORAL_TABLET | Freq: Every day | ORAL | 1 refills | Status: DC

## 2019-09-29 MED ORDER — PROMETHAZINE HCL 25 MG PO TABS: ORAL_TABLET | ORAL | 1 refills | Status: DC

## 2019-09-29 MED ORDER — LOSARTAN POTASSIUM 100 MG PO TABS: 100.0000 mg | ORAL_TABLET | Freq: Every day | ORAL | 1 refills | Status: DC

## 2019-09-29 NOTE — Progress Notes (Signed)
BP 121/78   Pulse 76   Temp 98.1 F (36.7 C) (Oral)   Wt 201 lb (91.2 kg)   SpO2 99%   BMI 27.57 kg/m    Subjective:    Patient ID: Chris Daugherty, male    DOB: 1955-09-20, 64 y.o.   MRN: [id number]  HPI: Chris Daugherty is a 64 y.o. male  Chief Complaint  Patient presents with  . Hyperlipidemia  . Hypertension  . Ear Pain    left ear x 2 days   Here today for 6 month f/u chronic conditions.   Left ear bothering him the past 2 days. Having dryness and crusting often and now some swelling on outer ear, tenderness, and pain radiating down the side of neck. Not trying anything OTC.   HTN - Not regularly checking home BPs. Taking medication faithfully without side effects. Denies CP, SOB, HAs, dizziness. Eating healthy and trying to stay active.   HLD - On tricor, tolerating well without side effects. Trying to eat healthy diet. Denies claudication, CP, SOB.   Depression and anxiety well controlled on zoloft. Denies SI/HI, sleep or appetite issues, anhedonia.   GERD well controlled with prilosec. No breakthrough sxs, melena, epigastric pain.   Depression screen [facility] 2/9 09/12/2018 02/27/2018 09/03/2017  Decreased Interest 0 0 0  Down, Depressed, Hopeless 0 0 0  PHQ - 2 Score 0 0 0  Altered sleeping 0 - -  Tired, decreased energy 0 - -  Change in appetite 0 - -  Feeling bad or failure about yourself  0 - -  Trouble concentrating 0 - -  Moving slowly or fidgety/restless 0 - -  Suicidal thoughts 0 - -  PHQ-9 Score 0 - -  Difficult doing work/chores Not difficult at all - -   GAD 7 : Generalized Anxiety Score 03/18/2019 09/12/2018 02/27/2018  Nervous, Anxious, on Edge 0 0 0  Control/stop worrying 0 0 0  Worry too much - different things 0 0 0  Trouble relaxing 0 0 0  Restless 0 0 0  Easily annoyed or irritable 0 0 0  Afraid - awful might happen 0 0 0  Total GAD 7 Score 0 0 0  Anxiety Difficulty - Not difficult at all -   Relevant past medical, surgical, family and  social history reviewed and updated as indicated. Interim medical history since our last visit reviewed. Allergies and medications reviewed and updated.  Review of Systems  Per HPI unless specifically indicated above     Objective:    BP 121/78   Pulse 76   Temp 98.1 F (36.7 C) (Oral)   Wt 201 lb (91.2 kg)   SpO2 99%   BMI 27.57 kg/m   Wt Readings from Last 3 Encounters:  09/29/19 201 lb (91.2 kg)  12/01/18 208 lb (94.3 kg)  09/12/18 206 lb (93.4 kg)    Physical Exam Vitals and nursing note reviewed.  Constitutional:      Appearance: Normal appearance.  HENT:     Head: Atraumatic.  Eyes:     Extraocular Movements: Extraocular movements intact.     Conjunctiva/sclera: Conjunctivae normal.  Cardiovascular:     Rate and Rhythm: Normal rate and regular rhythm.  Pulmonary:     Effort: Pulmonary effort is normal.     Breath sounds: Normal breath sounds.  Musculoskeletal:        General: Normal range of motion.     Cervical back: Normal range of motion and neck supple.  Skin:  General: Skin is warm and dry.     Comments: Left outer ear and EAC crusted, dry and mildly erythematous  Neurological:     General: No focal deficit present.     Mental Status: He is oriented to person, place, and time.  Psychiatric:        Mood and Affect: Mood normal.        Thought Content: Thought content normal.        Judgment: Judgment normal.     Results for orders placed or performed in visit on 09/29/19  Comprehensive metabolic panel  Result Value Ref Range   Glucose 106 (H) 65 - 99 mg/dL   BUN 22 8 - 27 mg/dL   Creatinine, Ser 1.30 (H) 0.76 - 1.27 mg/dL   GFR calc non Af Amer 58 (L) >59 mL/min/1.73   GFR calc Af Amer 67 >59 mL/min/1.73   BUN/Creatinine Ratio 17 10 - 24   Sodium 143 134 - 144 mmol/L   Potassium 4.0 3.5 - 5.2 mmol/L   Chloride 103 96 - 106 mmol/L   CO2 23 20 - 29 mmol/L   Calcium 9.4 8.6 - 10.2 mg/dL   Total Protein 6.8 6.0 - 8.5 g/dL   Albumin 4.9 (H)  3.8 - 4.8 g/dL   Globulin, Total 1.9 1.5 - 4.5 g/dL   Albumin/Globulin Ratio 2.6 (H) 1.2 - 2.2   Bilirubin Total 0.3 0.0 - 1.2 mg/dL   Alkaline Phosphatase 52 48 - 121 IU/L   AST 20 0 - 40 IU/L   ALT 16 0 - 44 IU/L  Lipid Panel w/o Chol/HDL Ratio  Result Value Ref Range   Cholesterol, Total 195 100 - 199 mg/dL   Triglycerides 179 (H) 0 - 149 mg/dL   HDL 37 (L) >39 mg/dL   VLDL Cholesterol Cal 32 5 - 40 mg/dL   LDL Chol Calc (NIH) 126 (H) 0 - 99 mg/dL      Assessment & Plan:   Problem List Items Addressed This Visit      Digestive   Acid reflux    Stable and under good control, continue current regimen        Genitourinary   Hypertensive renal disease - Primary    BPs stable and well controlled, continue current regimen      Relevant Medications   losartan (COZAAR) 100 MG tablet   amLODipine (NORVASC) 10 MG tablet   Other Relevant Orders   Comprehensive metabolic panel (Completed)     Other   Anxiety    Stable and well controlled, continue current regimen      Relevant Medications   sertraline (ZOLOFT) 100 MG tablet   Hyperlipidemia    Recheck lipids, adjust as needed. Continue current regimen and diet and exercise changes      Relevant Medications   losartan (COZAAR) 100 MG tablet   fenofibrate (TRICOR) 145 MG tablet   amLODipine (NORVASC) 10 MG tablet   Other Relevant Orders   Lipid Panel w/o Chol/HDL Ratio (Completed)    Other Visit Diagnoses    Hx of migraine headaches       Relevant Medications   promethazine (PHENERGAN) 25 MG tablet   Essential (primary) hypertension       Relevant Medications   losartan (COZAAR) 100 MG tablet   fenofibrate (TRICOR) 145 MG tablet   amLODipine (NORVASC) 10 MG tablet   Left ear pain       Known eczema of outer ear, appears cracked, irritated. Restart kenalog cream,  neosporin prn. F/u if not resolving       Follow up plan: Return in about 6 months (around 03/30/2020) for 6 month f/u.

## 2019-09-30 LAB — COMPREHENSIVE METABOLIC PANEL
ALT: 16 IU/L (ref 0–44)
AST: 20 IU/L (ref 0–40)
Albumin/Globulin Ratio: 2.6 — ABNORMAL HIGH (ref 1.2–2.2)
Albumin: 4.9 g/dL — ABNORMAL HIGH (ref 3.8–4.8)
Alkaline Phosphatase: 52 IU/L (ref 48–121)
BUN/Creatinine Ratio: 17 (ref 10–24)
BUN: 22 mg/dL (ref 8–27)
Bilirubin Total: 0.3 mg/dL (ref 0.0–1.2)
CO2: 23 mmol/L (ref 20–29)
Calcium: 9.4 mg/dL (ref 8.6–10.2)
Chloride: 103 mmol/L (ref 96–106)
Creatinine, Ser: 1.3 mg/dL — ABNORMAL HIGH (ref 0.76–1.27)
GFR calc Af Amer: 67 mL/min/{1.73_m2} (ref >59)
GFR calc non Af Amer: 58 mL/min/{1.73_m2} — ABNORMAL LOW (ref >59)
Globulin, Total: 1.9 g/dL (ref 1.5–4.5)
Glucose: 106 mg/dL — ABNORMAL HIGH (ref 65–99)
Potassium: 4 mmol/L (ref 3.5–5.2)
Sodium: 143 mmol/L (ref 134–144)
Total Protein: 6.8 g/dL (ref 6.0–8.5)

## 2019-09-30 LAB — LIPID PANEL W/O CHOL/HDL RATIO
Cholesterol, Total: 195 mg/dL (ref 100–199)
HDL: 37 mg/dL — ABNORMAL LOW (ref >39)
LDL Chol Calc (NIH): 126 mg/dL — ABNORMAL HIGH (ref 0–99)
Triglycerides: 179 mg/dL — ABNORMAL HIGH (ref 0–149)
VLDL Cholesterol Cal: 32 mg/dL (ref 5–40)

## 2019-10-01 NOTE — Assessment & Plan Note (Signed)
Stable and under good control, continue current regimen 

## 2019-10-01 NOTE — Assessment & Plan Note (Signed)
BPs stable and well controlled, continue current regimen 

## 2019-10-01 NOTE — Assessment & Plan Note (Signed)
Stable and well controlled, continue current regimen 

## 2019-10-01 NOTE — Assessment & Plan Note (Signed)
Recheck lipids, adjust as needed. Continue current regimen and diet and exercise changes

## 2019-11-13 ENCOUNTER — Encounter: Payer: Self-pay | Admitting: Family Medicine

## 2020-02-10 ENCOUNTER — Ambulatory Visit: Payer: Self-pay | Admitting: Nurse Practitioner

## 2020-02-24 IMAGING — DX LEFT TIBIA AND FIBULA - 2 VIEW
4 series · 4 of 4 positions shown · non-contrast
Comparison: None.

CLINICAL DATA: Left lower leg pain after hitting it on a couch 3
days ago.

EXAM:
LEFT TIBIA AND FIBULA - 2 VIEW

[tibia ap (1 of 2)]
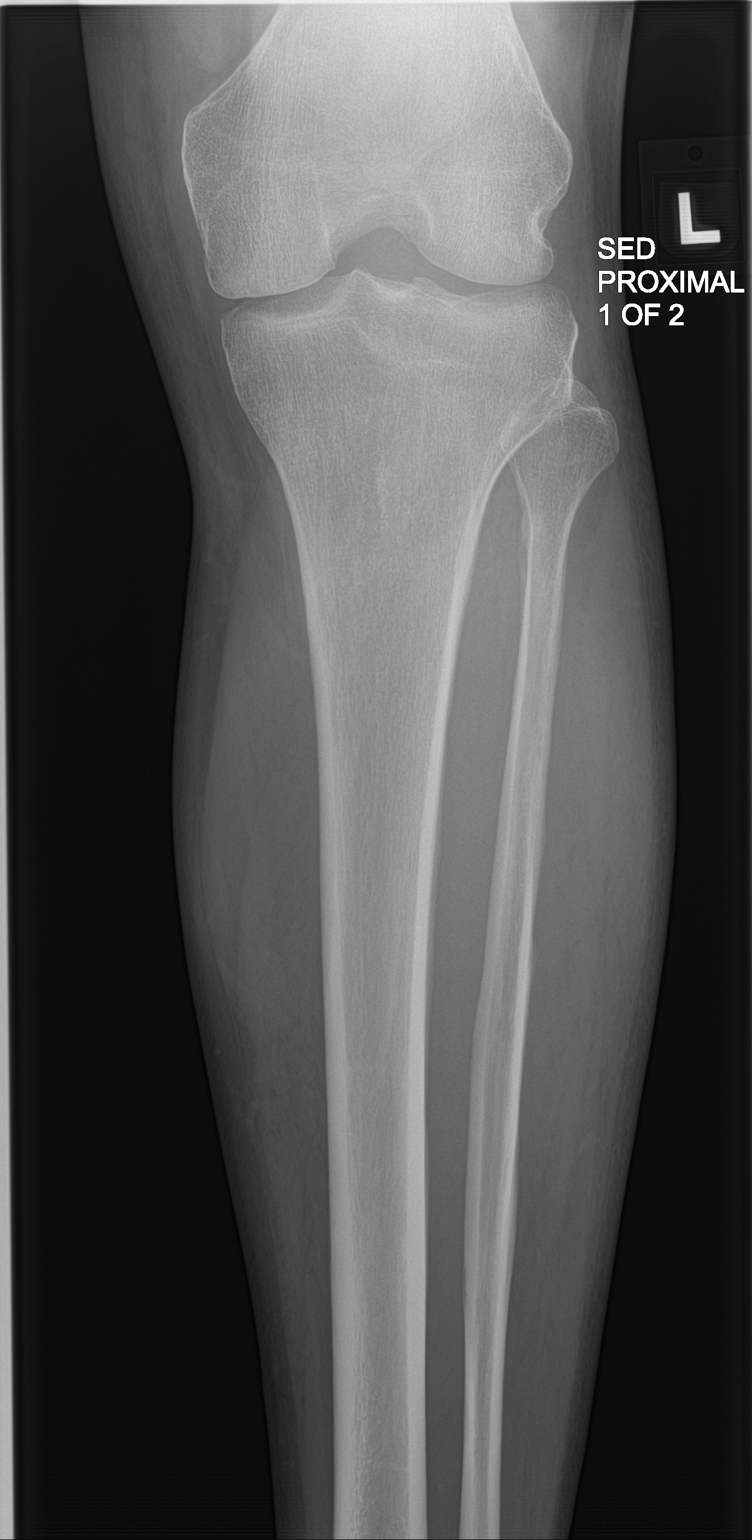

[tibia ap (2 of 2)]
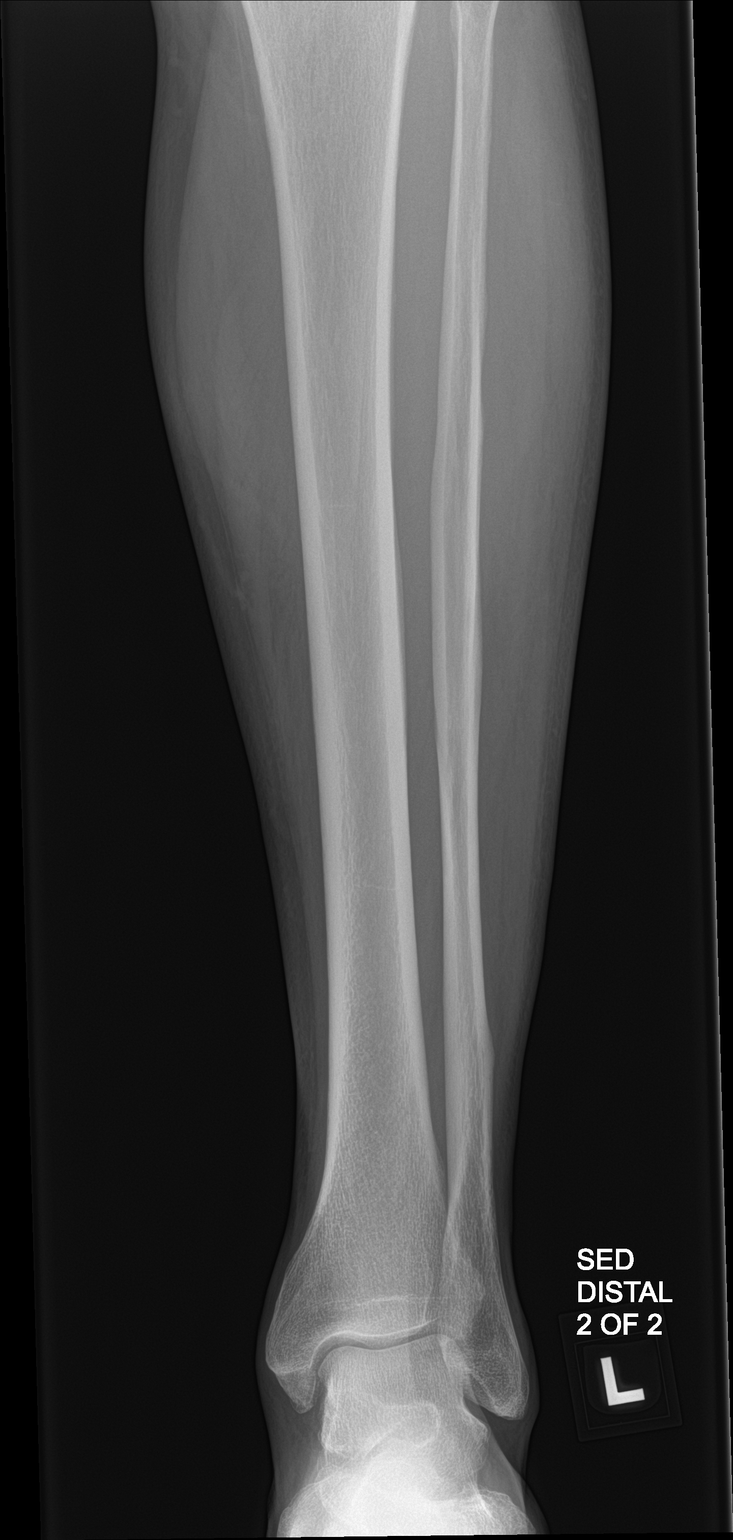

[tibia lat (1 of 2)]
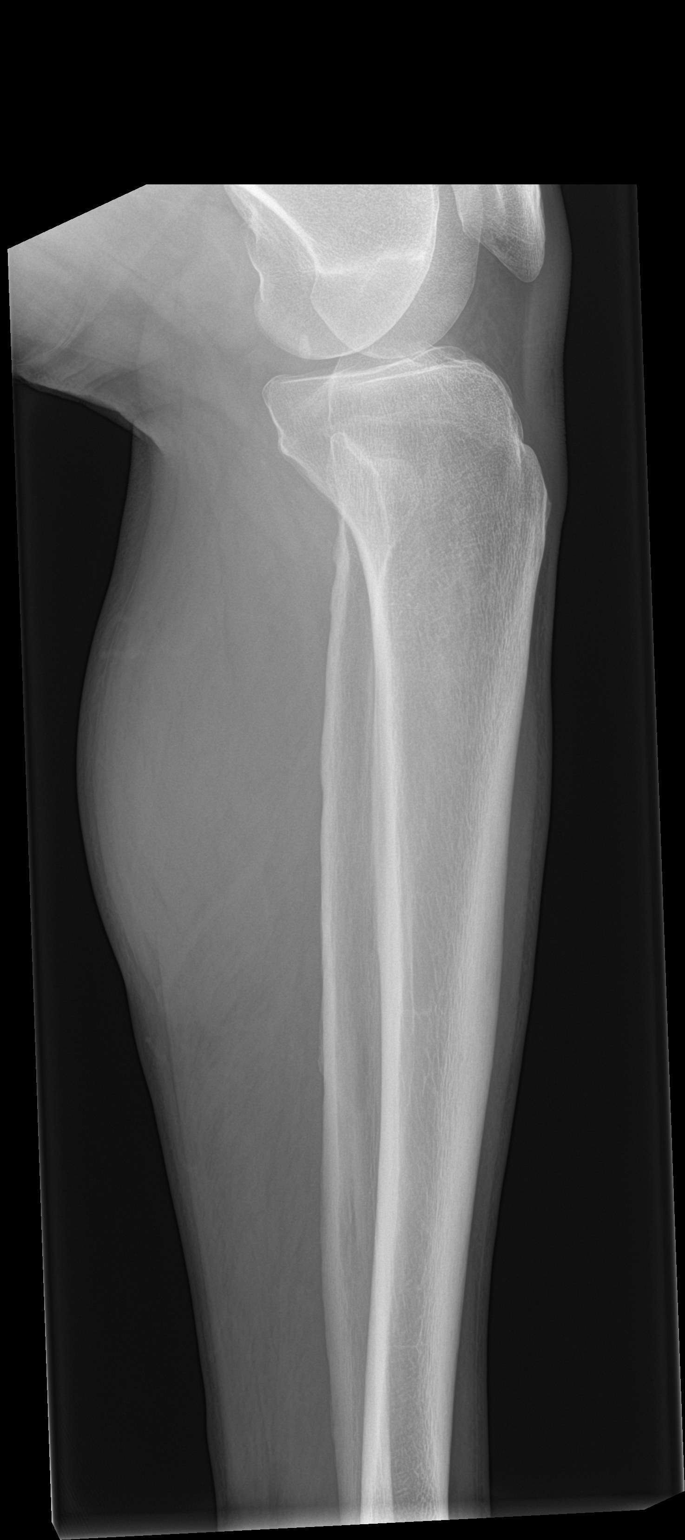

[tibia lat (2 of 2)]
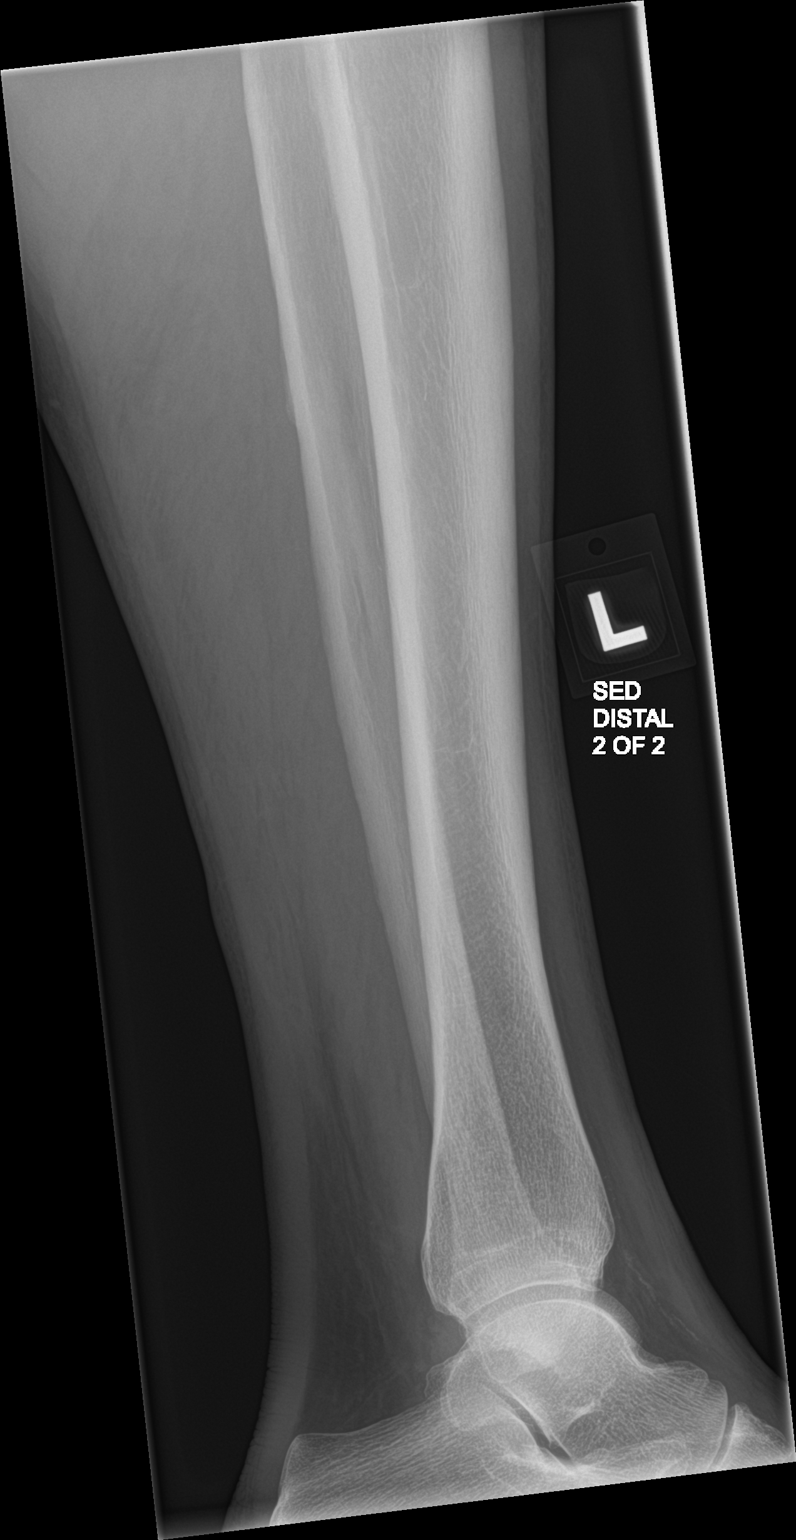

[4 of 4 positions shown; findings below may reference images not displayed]

FINDINGS: There is no evidence of fracture or other focal bone lesions. Soft
tissues are unremarkable.
IMPRESSION: Negative.

## 2020-03-25 ENCOUNTER — Other Ambulatory Visit: Payer: Self-pay | Admitting: Family Medicine

## 2020-03-25 DIAGNOSIS — I10 Essential (primary) hypertension: Secondary | ICD-10-CM

## 2020-03-25 NOTE — Telephone Encounter (Signed)
Upcoming appointment with you next week

## 2020-03-25 NOTE — Telephone Encounter (Signed)
Requested medication (s) are due for refill today: yes  Requested medication (s) are on the active medication list: yes  Last refill:  by Merrie Roof 09/29/19  both #90  1 refill  Future visit scheduled: Yes with Dr Ky Barban  Notes to clinic:  Patient has not been seen by another provider Cr was high 09/29/19    Requested Prescriptions  Pending Prescriptions Disp Refills   losartan (COZAAR) 100 MG tablet [Pharmacy Med Name: LOSARTAN POTASSIUM 100 MG TAB] 90 tablet 1    Sig: TAKE ONE TABLET BY MOUTH DAILY      Cardiovascular:  Angiotensin Receptor Blockers Failed - 03/25/2020  8:58 AM      Failed - Cr in normal range and within 180 days    Creatinine, Ser  Date Value Ref Range Status  09/29/2019 1.30 (H) 0.76 - 1.27 mg/dL Final          Passed - K in normal range and within 180 days    Potassium  Date Value Ref Range Status  09/29/2019 4.0 3.5 - 5.2 mmol/L Final          Passed - Patient is not pregnant      Passed - Last BP in normal range    BP Readings from Last 1 Encounters:  09/29/19 121/78          Passed - Valid encounter within last 6 months    Recent Outpatient Visits           5 months ago Hypertensive renal disease   [facility] Volney American, PA-C   1 year ago Hypertensive renal disease   [facility] Volney American, [place]   1 year ago Chronic eczematous otitis externa of both ears   [place], Lilia Argue, [place]   1 year ago Essential (primary) hypertension   [place], [place], [place]   2 years ago Hyperlipidemia, unspecified hyperlipidemia type   [facility], Lilia Argue, [place]       Future Appointments             In 5 days Rumball, Jake Church, DO [facility]               amLODipine ([place]) 10 MG tablet [Pharmacy Med Name: amLODIPine BESYLATE 10 MG TAB] 90 tablet 1    Sig: TAKE ONE TABLET BY MOUTH DAILY       Cardiovascular:  Calcium Channel Blockers Passed - 03/25/2020  8:58 AM      Passed - Last BP in normal range    BP Readings from Last 1 Encounters:  09/29/19 121/78          Passed - Valid encounter within last 6 months    Recent Outpatient Visits           5 months ago Hypertensive renal disease   [facility] Volney American, [place]   1 year ago Hypertensive renal disease   [facility] Volney American, [place]   1 year ago Chronic eczematous otitis externa of both ears   [facility] Volney American, [place]   1 year ago Essential (primary) hypertension   [place], Luis M. Cintron, [place]   2 years ago Hyperlipidemia, unspecified hyperlipidemia type   [facility], Lilia Argue, [place]       Future Appointments             In 5  days Caro Laroche, DO [facility]

## 2020-03-30 ENCOUNTER — Ambulatory Visit: Payer: Self-pay | Admitting: Family Medicine

## 2020-03-30 ENCOUNTER — Ambulatory Visit: Payer: Self-pay | Admitting: Nurse Practitioner

## 2020-03-30 NOTE — Progress Notes (Deleted)
    SUBJECTIVE:   CHIEF COMPLAINT / HPI:   Past Medical History:  Diagnosis Date  . Acid reflux   . Allergic rhinitis   . Allergy   . Anxiety   . Depression   . Hyperlipidemia   . Hypertension   . Testicular hypofunction    Hypertension: - Medications: amlodipine 10mg , losartan 100mg ,  - Compliance: *** - Checking BP at home: *** - Denies any SOB, CP, vision changes, LE edema, medication SEs, or symptoms of hypotension - Diet: *** - Exercise: ***  Anxiety/Depression - Medications: zoloft 200mg  - Taking: *** - Counseling: *** - Previous hospitalizations: *** - FH of psych illness: *** - Symptoms: *** - Current stressors: *** - Coping Mechanisms: ***   HLD - medications: fenofibrate 145mg  daily - compliance: *** - medication SEs: ***  MIGRAINES - Meds: imitrex 100mg  prn Duration: {Blank single:19197::"chronic","days","weeks","months","years"} Onset: {Blank single:19197::"sudden","gradual"} Severity: {Blank single:19197::"mild","moderate","severe","1/10","2/10","3/10","4/10","5/10","6/10","7/10","8/10","9/10","10/10"} Quality: {Blank multiple:19196::"sharp","dull","aching","burning","cramping","ill-defined","itchy","pressure-like","pulling","shooting","sore","stabbing","tender","tearing","throbbing"} Frequency: {Blank single:19197::"constant","intermittent","occasional","rare","every few minutes","a few times a hour","a few times a day","a few times a week","a few times a month","a few times a year"} Location:  Headache duration: Radiation: {Blank single:19197::"yes","no"} Time of day headache occurs:  Alleviating factors:  Aggravating factors:  Headache status at time of visit: {Blank single:19197::"current headache","asymptomatic"} Treatments attempted: Treatments attempted: {Blank multiple:19196::"none","rest","ice","heat","APAP","ibuprofen","aleve", excedrine","triptans","propranolol","topamax","amitriptyline"}   Aura: {Blank single:19197::"yes","no"} Nausea:   {Blank single:19197::"yes","no"} Vomiting: {Blank single:19197::"yes","no"} Photophobia:  {Blank single:19197::"yes","no"} Phonophobia:  {Blank single:19197::"yes","no"} Effect on social functioning:  {Blank single:19197::"yes","no"} Numbers of missed days of school/work each month:  Confusion:  {Blank single:19197::"yes","no"} Gait disturbance/ataxia:  {Blank single:19197::"yes","no"} Behavioral changes:  {Blank single:19197::"yes","no"} Fevers:  {Blank single:19197::"yes","no"}    OBJECTIVE:   There were no vitals taken for this visit.  ***  ASSESSMENT/PLAN:   No problem-specific Assessment & Plan notes found for this encounter.     , DO [facility]

## 2020-03-31 ENCOUNTER — Ambulatory Visit (INDEPENDENT_AMBULATORY_CARE_PROVIDER_SITE_OTHER): Payer: Medicare Other | Admitting: Family Medicine

## 2020-03-31 ENCOUNTER — Encounter: Payer: Self-pay | Admitting: Family Medicine

## 2020-03-31 ENCOUNTER — Other Ambulatory Visit: Payer: Self-pay

## 2020-03-31 VITALS — BP 131/83 | HR 86 | Temp 97.5°F

## 2020-03-31 DIAGNOSIS — F419 Anxiety disorder, unspecified: Secondary | ICD-10-CM

## 2020-03-31 DIAGNOSIS — G43009 Migraine without aura, not intractable, without status migrainosus: Secondary | ICD-10-CM

## 2020-03-31 DIAGNOSIS — Z114 Encounter for screening for human immunodeficiency virus [HIV]: Secondary | ICD-10-CM | POA: Diagnosis not present

## 2020-03-31 DIAGNOSIS — Z1159 Encounter for screening for other viral diseases: Secondary | ICD-10-CM | POA: Diagnosis not present

## 2020-03-31 DIAGNOSIS — I129 Hypertensive chronic kidney disease with stage 1 through stage 4 chronic kidney disease, or unspecified chronic kidney disease: Secondary | ICD-10-CM

## 2020-03-31 DIAGNOSIS — K219 Gastro-esophageal reflux disease without esophagitis: Secondary | ICD-10-CM

## 2020-03-31 DIAGNOSIS — E785 Hyperlipidemia, unspecified: Secondary | ICD-10-CM

## 2020-03-31 DIAGNOSIS — T148XXA Other injury of unspecified body region, initial encounter: Secondary | ICD-10-CM | POA: Insufficient documentation

## 2020-03-31 MED ORDER — FENOFIBRATE 145 MG PO TABS: 145.0000 mg | ORAL_TABLET | Freq: Every day | ORAL | 1 refills | Status: DC

## 2020-03-31 NOTE — Assessment & Plan Note (Signed)
Not present on exam today. History sounds consistent with contact dermatitis though with unclear trigger. Recommended returning if reoccurs. May continue to use topical steroid prn.

## 2020-03-31 NOTE — Assessment & Plan Note (Signed)
Well controlled on current regimen, no changes today.

## 2020-03-31 NOTE — Patient Instructions (Addendum)
It was great to see you!  Our plans for today:  - No changes to your medications today. - We are checking some labs today, we will release these results to your MyChart. - Continue to use the triamcinolone cream for your fingers as needed.  - Come back in 6 months.   Take care and seek immediate care sooner if you develop any concerns.   Dr. Linwood Dibbles

## 2020-03-31 NOTE — Assessment & Plan Note (Signed)
Stable. Refill provided. Obtaining labs today.

## 2020-03-31 NOTE — Assessment & Plan Note (Signed)
Well controlled on current regimen, no changes today. Obtaining BMP.

## 2020-03-31 NOTE — Progress Notes (Signed)
   SUBJECTIVE:   CHIEF COMPLAINT / HPI:   Patient Active Problem List   Diagnosis Date Noted  . Blister 03/31/2020  . H/O tinea 09/03/2017  . Decreased calculated GFR 05/30/2017  . Hypertensive renal disease   . Allergic rhinitis   . Elevated blood sugar 04/13/2016  . Seasonal allergies 12/09/2015  . Hyperlipidemia 08/19/2015  . Renal insufficiency 08/19/2015  . Insomnia 03/19/2015  . Migraine 12/11/2014  . 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 Encounter for long-term current use of medication 01/12/2014  . Anxiety 06/18/2013  . Acid reflux 06/18/2013  . 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 Avitaminosis D 06/18/2013  . Spermatocele 12/21/2011   Hypertension: - Medications: amlodipine 10mg , losartan 100mg  - Compliance: good  - Checking BP at home: occasionally - Denies any SOB, CP, vision changes, LE edema, medication SEs, or symptoms of hypotension  Anxiety - Medications: zoloft 200mg  daily - Taking: good compliance - Counseling: no - Previous hospitalizations: no - FH of psych illness: no - Symptoms: none - Current stressors: work, but feels well managed  HLD - medications: fenofibrate 145mg  - compliance: good - medication SEs: none  GERD - Meds: omeprazole 20mg  daily - Symptoms:  no other symptoms.  - Denies chest pain, difficulty swallowing, dysphagia, early satiety and fullness after meals denies dysphagia has not lost weight denies melena, hematochezia, hematemesis, and coffee ground emesis.  - Previous treatment: proton pump inhibitors.  Migraines - Meds: robaxin, phenerghan, imitrex - Triggers: certain perfumes - Last used about 6 months ago. - normally will have photophobia/phonophobia, nausea.   Blisters on fingers - occasionally will get itchy blisters that come and go at b/l DIP joints, itches. Will crust over. Hurts to bend until healing. Put triamcinolone cream on it, helped. Never  happened prior to few months ago. No current blisters. No joint pains, fevers, rashes anywhere else.  OBJECTIVE:   BP 131/83   Pulse 86   Temp (!) 97.5 F (36.4 C)   SpO2 99%   Gen: well appearing, in NAD Card: RRR, no murmur Lungs: CTAB Ext: WWP, no edema. Slight erythema to b/l DIP joints without rash, blisters, pustules. Intact grip strength, finger abduction/adduction. No pain with palpation of joints.   ASSESSMENT/PLAN:   Migraine Well controlled on current regimen, no changes today.  Acid reflux Well controlled on current regimen, no changes today.  Hypertensive renal disease Well controlled on current regimen, no changes today. Obtaining BMP.  Anxiety Well controlled on current regimen, no changes today.  Hyperlipidemia Stable. Refill provided. Obtaining labs today.  Blister Not present on exam today. History sounds consistent with contact dermatitis though with unclear trigger. Recommended returning if reoccurs. May continue to use topical steroid prn.   Follow up in 6 months.  , DO

## 2020-04-01 ENCOUNTER — Encounter: Payer: Self-pay | Admitting: Family Medicine

## 2020-04-01 LAB — HIV ANTIBODY (ROUTINE TESTING W REFLEX): HIV Screen 4th Generation wRfx: NONREACTIVE

## 2020-04-01 LAB — BASIC METABOLIC PANEL
BUN/Creatinine Ratio: 13 (ref 10–24)
BUN: 17 mg/dL (ref 8–27)
CO2: 25 mmol/L (ref 20–29)
Calcium: 9.7 mg/dL (ref 8.6–10.2)
Chloride: 101 mmol/L (ref 96–106)
Creatinine, Ser: 1.31 mg/dL — ABNORMAL HIGH (ref 0.76–1.27)
GFR calc Af Amer: 66 mL/min/{1.73_m2} (ref >59)
GFR calc non Af Amer: 57 mL/min/{1.73_m2} — ABNORMAL LOW (ref >59)
Glucose: 85 mg/dL (ref 65–99)
Potassium: 4.3 mmol/L (ref 3.5–5.2)
Sodium: 140 mmol/L (ref 134–144)

## 2020-04-01 LAB — LIPID PANEL
Chol/HDL Ratio: 4.8 ratio (ref 0.0–5.0)
Cholesterol, Total: 188 mg/dL (ref 100–199)
HDL: 39 mg/dL — ABNORMAL LOW (ref >39)
LDL Chol Calc (NIH): 114 mg/dL — ABNORMAL HIGH (ref 0–99)
Triglycerides: 197 mg/dL — ABNORMAL HIGH (ref 0–149)
VLDL Cholesterol Cal: 35 mg/dL (ref 5–40)

## 2020-04-01 LAB — HEPATITIS C ANTIBODY: Hep C Virus Ab: <0.1 s/co ratio (ref 0.0–0.9)

## 2020-04-17 DIAGNOSIS — R21 Rash and other nonspecific skin eruption: Secondary | ICD-10-CM | POA: Diagnosis not present

## 2020-04-17 DIAGNOSIS — M791 Myalgia, unspecified site: Secondary | ICD-10-CM | POA: Diagnosis not present

## 2020-04-17 DIAGNOSIS — U071 COVID-19: Secondary | ICD-10-CM | POA: Diagnosis not present

## 2020-05-25 NOTE — Progress Notes (Signed)
BP 117/76   Pulse 83   Temp 98 F (36.7 C)   Wt 200 lb (90.7 kg)   SpO2 98%   BMI 27.43 kg/m    Subjective:    Patient ID: Chris Daugherty, male    DOB: [birth year], 65 y.o.   MRN: [id number]  HPI: Chris Daugherty is a 65 y.o. male  Chief Complaint  Patient presents with  . Rash    Bilateral hands, itchy, no pain. Very small blisters that are very itchy, then they will open and scab over   RASH Duration:  2 months  Location: hands  Itching: yes Burning: yes Redness: yes Oozing: usually scabs over Scaling: No Blisters: yes Painful: yes Fevers: no Change in detergents/soaps/personal care products: no Recent illness: no Recent travel:no History of same: no Context: Same Alleviating factors: nothing Treatments attempted:Triamcinalone with nystatin and hydrocortisone cream Shortness of breath: no  Throat/tongue swelling: no Myalgias/arthralgias: no  COUGH Patient had COVID about 5 weeks ago.  He is fully vaccinated. Symptoms have improved.  Patient has lingering cough and wondering what he can do to help with the cough.   Relevant past medical, surgical, family and social history reviewed and updated as indicated. Interim medical history since our last visit reviewed. Allergies and medications reviewed and updated.  Review of Systems  Respiratory: Positive for cough.   Skin: Positive for rash (on bilateral).    Per HPI unless specifically indicated above     Objective:    BP 117/76   Pulse 83   Temp 98 F (36.7 C)   Wt 200 lb (90.7 kg)   SpO2 98%   BMI 27.43 kg/m   Wt Readings from Last 3 Encounters:  05/26/20 200 lb (90.7 kg)  09/29/19 201 lb (91.2 kg)  12/01/18 208 lb (94.3 kg)    Physical Exam Vitals and nursing note reviewed.  Constitutional:      General: He is not in acute distress.    Appearance: Normal appearance. He is not ill-appearing, toxic-appearing or diaphoretic.  HENT:     Head: Normocephalic.     Right Ear: External ear normal.      Left Ear: External ear normal.     Nose: Nose normal. No congestion or rhinorrhea.     Mouth/Throat:     Mouth: Mucous membranes are moist.  Eyes:     General:        Right eye: No discharge.        Left eye: No discharge.     Extraocular Movements: Extraocular movements intact.     Conjunctiva/sclera: Conjunctivae normal.     Pupils: Pupils are equal, round, and reactive to light.  Cardiovascular:     Rate and Rhythm: Normal rate and regular rhythm.     Heart sounds: No murmur heard.   Pulmonary:     Effort: Pulmonary effort is normal. No respiratory distress.     Breath sounds: Normal breath sounds. No wheezing, rhonchi or rales.  Abdominal:     General: Abdomen is flat. Bowel sounds are normal.  Musculoskeletal:     Cervical back: Normal range of motion and neck supple.  Skin:    General: Skin is warm and dry.     Capillary Refill: Capillary refill takes less than 2 seconds.     Findings: Rash present. Rash is macular.       Neurological:     General: No focal deficit present.     Mental Status: He is alert and oriented  to person, place, and time.  Psychiatric:        Mood and Affect: Mood normal.        Behavior: Behavior normal.        Thought Content: Thought content normal.        Judgment: Judgment normal.     Results for orders placed or performed in visit on 82/88/33  Basic Metabolic Panel (BMET)  Result Value Ref Range   Glucose 85 65 - 99 mg/dL   BUN 17 8 - 27 mg/dL   Creatinine, Ser 1.31 (H) 0.76 - 1.27 mg/dL   GFR calc non Af Amer 57 (L) >59 mL/min/1.73   GFR calc Af Amer 66 >59 mL/min/1.73   BUN/Creatinine Ratio 13 10 - 24   Sodium 140 134 - 144 mmol/L   Potassium 4.3 3.5 - 5.2 mmol/L   Chloride 101 96 - 106 mmol/L   CO2 25 20 - 29 mmol/L   Calcium 9.7 8.6 - 10.2 mg/dL  Lipid panel  Result Value Ref Range   Cholesterol, Total 188 100 - 199 mg/dL   Triglycerides 197 (H) 0 - 149 mg/dL   HDL 39 (L) >39 mg/dL   VLDL Cholesterol Cal 35 5 - 40  mg/dL   LDL Chol Calc (NIH) 114 (H) 0 - 99 mg/dL   Chol/HDL Ratio 4.8 0.0 - 5.0 ratio  Hepatitis C antibody  Result Value Ref Range   Hep C Virus Ab <0.1 0.0 - 0.9 s/co ratio  HIV antibody (with reflex)  Result Value Ref Range   HIV Screen 4th Generation wRfx Non Reactive Non Reactive      Assessment & Plan:   Problem List Items Addressed This Visit   None   Visit Diagnoses    Rash    -  Primary   Relevant Medications   triamcinolone (KENALOG) 0.1 %   methylPREDNISolone (MEDROL DOSEPAK) 4 MG TBPK tablet   Cough       Relevant Medications   benzonatate (TESSALON) 100 MG capsule       Follow up plan: Return in about 4 months (around 09/23/2020) for HTN, HLD, DM2 FU.   A total of 30 minutes were spent on this encounter today.  When total time is documented, this includes both the face-to-face and non-face-to-face time personally spent before, during and after the visit on the date of the encounter.

## 2020-05-26 ENCOUNTER — Ambulatory Visit (INDEPENDENT_AMBULATORY_CARE_PROVIDER_SITE_OTHER): Payer: Medicare Other | Admitting: Nurse Practitioner

## 2020-05-26 ENCOUNTER — Encounter: Payer: Self-pay | Admitting: Nurse Practitioner

## 2020-05-26 ENCOUNTER — Other Ambulatory Visit: Payer: Self-pay

## 2020-05-26 VITALS — BP 117/76 | HR 83 | Temp 98.0°F | Wt 200.0 lb

## 2020-05-26 DIAGNOSIS — Z23 Encounter for immunization: Secondary | ICD-10-CM

## 2020-05-26 DIAGNOSIS — R059 Cough, unspecified: Secondary | ICD-10-CM

## 2020-05-26 DIAGNOSIS — R21 Rash and other nonspecific skin eruption: Secondary | ICD-10-CM | POA: Diagnosis not present

## 2020-05-26 MED ORDER — BENZONATATE 100 MG PO CAPS: 100.0000 mg | ORAL_CAPSULE | Freq: Two times a day (BID) | ORAL | 0 refills | Status: DC | PRN

## 2020-05-26 MED ORDER — TRIAMCINOLONE ACETONIDE 0.1 % EX CREA: 1.0000 "application " | TOPICAL_CREAM | Freq: Two times a day (BID) | CUTANEOUS | 0 refills | Status: DC

## 2020-05-26 MED ORDER — METHYLPREDNISOLONE 4 MG PO TBPK: ORAL_TABLET | ORAL | 0 refills | Status: DC

## 2020-05-26 NOTE — Addendum Note (Signed)
Addended by: Georgina Peer on: 05/26/2020 01:14 PM   Modules accepted: Orders

## 2020-07-01 ENCOUNTER — Other Ambulatory Visit: Payer: Self-pay | Admitting: Nurse Practitioner

## 2020-07-01 DIAGNOSIS — I10 Essential (primary) hypertension: Secondary | ICD-10-CM

## 2020-07-01 NOTE — Telephone Encounter (Signed)
Medication Refill - Medication: Losartan 100 mg, Amlodipine 10 mg, Sertraline 100 mg  Has the patient contacted their pharmacy? Yes.   Pt said the pharmacy told him they had sent the rx request in Pt said the pharmacy told him they had sent the rx request in (Agent: If no, request that the patient contact the pharmacy for the refill.) (Agent: If yes, when and what did the pharmacy advise?)  Preferred Pharmacy (with phone number or street name): [facility]  Agent: Please be advised that RX refills may take up to 3 business days. We ask that you follow-up with your pharmacy.

## 2020-07-02 MED ORDER — SERTRALINE HCL 100 MG PO TABS: 200.0000 mg | ORAL_TABLET | Freq: Every day | ORAL | 0 refills | Status: DC

## 2020-07-02 MED ORDER — LOSARTAN POTASSIUM 100 MG PO TABS: 100.0000 mg | ORAL_TABLET | Freq: Every day | ORAL | 0 refills | Status: DC

## 2020-07-02 MED ORDER — AMLODIPINE BESYLATE 10 MG PO TABS: 10.0000 mg | ORAL_TABLET | Freq: Every day | ORAL | 0 refills | Status: DC

## 2020-07-02 NOTE — Telephone Encounter (Signed)
Patient last seen 03/31/20 and has appointment 09/27/20

## 2020-07-08 ENCOUNTER — Telehealth (INDEPENDENT_AMBULATORY_CARE_PROVIDER_SITE_OTHER): Payer: Medicare Other | Admitting: Nurse Practitioner

## 2020-07-08 ENCOUNTER — Other Ambulatory Visit: Payer: Self-pay

## 2020-07-08 DIAGNOSIS — J069 Acute upper respiratory infection, unspecified: Secondary | ICD-10-CM

## 2020-07-08 MED ORDER — CLOTRIMAZOLE-BETAMETHASONE 1-0.05 % EX CREA: 1.0000 "application " | TOPICAL_CREAM | Freq: Two times a day (BID) | CUTANEOUS | 1 refills | Status: DC

## 2020-07-08 MED ORDER — ALBUTEROL SULFATE HFA 108 (90 BASE) MCG/ACT IN AERS: 2.0000 | INHALATION_SPRAY | Freq: Four times a day (QID) | RESPIRATORY_TRACT | 0 refills | Status: DC | PRN

## 2020-07-08 MED ORDER — HYDROCOD POLST-CPM POLST ER 10-8 MG/5ML PO SUER: 5.0000 mL | Freq: Two times a day (BID) | ORAL | 0 refills | Status: DC

## 2020-07-08 MED ORDER — DOXYCYCLINE HYCLATE 100 MG PO TABS: 100.0000 mg | ORAL_TABLET | Freq: Two times a day (BID) | ORAL | 0 refills | Status: DC

## 2020-07-08 NOTE — Progress Notes (Signed)
There were no vitals taken for this visit.   Subjective:    Patient ID: Chris Daugherty, male    DOB: Feb 03, 1956, 65 y.o.   MRN: [id number]  HPI: Chris Daugherty is a 65 y.o. male  Chief Complaint  Patient presents with  . URI    Started coughing on Sunday, nasal congestion started on Monday, productive cough today. Patient states that this happens twice a year.     UPPER RESPIRATORY TRACT INFECTION Worst symptom: symptoms started Saturday.  Nasal congestion and cough. Fever: no Cough: yes Shortness of breath: no Wheezing: yes Chest pain: no Chest tightness: no Chest congestion: yes Nasal congestion: yes Runny nose: no Post nasal drip: no Sneezing: no Sore throat: no Swollen glands: no Sinus pressure: no Headache: no Face pain: no Toothache: no Ear pain: no bilateral Ear pressure: no bilateral Eyes red/itching:no Eye drainage/crusting: no  Vomiting: no Rash: no Fatigue: yes Sick contacts: no Strep contacts: no  Context: worse Recurrent sinusitis: yes Relief with OTC cold/cough medications: no  Treatments attempted: OTC cough medicine   Relevant past medical, surgical, family and social history reviewed and updated as indicated. Interim medical history since our last visit reviewed. Allergies and medications reviewed and updated.  Review of Systems  Constitutional: Positive for fatigue. Negative for fever.  HENT: Positive for congestion. Negative for ear pain, postnasal drip, rhinorrhea, sinus pressure, sinus pain, sneezing and sore throat.   Respiratory: Positive for cough and wheezing. Negative for chest tightness and shortness of breath.   Gastrointestinal: Negative for vomiting.  Skin: Negative for rash.  Neurological: Negative for headaches.    Per HPI unless specifically indicated above     Objective:    There were no vitals taken for this visit.  Wt Readings from Last 3 Encounters:  05/26/20 200 lb (90.7 kg)  09/29/19 201 lb (91.2 kg)  12/01/18  208 lb (94.3 kg)    Physical Exam Vitals and nursing note reviewed.  Constitutional:      General: He is not in acute distress.    Appearance: He is not ill-appearing.  HENT:     Head: Normocephalic.     Right Ear: Hearing normal.     Left Ear: Hearing normal.     Nose: Nose normal.  Pulmonary:     Effort: Pulmonary effort is normal. No respiratory distress.  Neurological:     Mental Status: He is alert.  Psychiatric:        Mood and Affect: Mood normal.        Behavior: Behavior normal.        Thought Content: Thought content normal.        Judgment: Judgment normal.     Results for orders placed or performed in visit on 40/81/44  Basic Metabolic Panel (BMET)  Result Value Ref Range   Glucose 85 65 - 99 mg/dL   BUN 17 8 - 27 mg/dL   Creatinine, Ser 1.31 (H) 0.76 - 1.27 mg/dL   GFR calc non Af Amer 57 (L) >59 mL/min/1.73   GFR calc Af Amer 66 >59 mL/min/1.73   BUN/Creatinine Ratio 13 10 - 24   Sodium 140 134 - 144 mmol/L   Potassium 4.3 3.5 - 5.2 mmol/L   Chloride 101 96 - 106 mmol/L   CO2 25 20 - 29 mmol/L   Calcium 9.7 8.6 - 10.2 mg/dL  Lipid panel  Result Value Ref Range   Cholesterol, Total 188 100 - 199 mg/dL   Triglycerides 197 (H)  0 - 149 mg/dL   HDL 39 (L) >39 mg/dL   VLDL Cholesterol Cal 35 5 - 40 mg/dL   LDL Chol Calc (NIH) 114 (H) 0 - 99 mg/dL   Chol/HDL Ratio 4.8 0.0 - 5.0 ratio  Hepatitis C antibody  Result Value Ref Range   Hep C Virus Ab <0.1 0.0 - 0.9 s/co ratio  HIV antibody (with reflex)  Result Value Ref Range   HIV Screen 4th Generation wRfx Non Reactive Non Reactive      Assessment & Plan:   Problem List Items Addressed This Visit   None   Visit Diagnoses    Viral upper respiratory tract infection    -  Primary   Complete course of antibiotics. Return to clinic if symptoms worsen. Cough medication and inhaler sent to the phamarcy to help with symptom control.   Relevant Medications   clotrimazole-betamethasone (LOTRISONE) cream        Follow up plan: Return if symptoms worsen or fail to improve.     This visit was completed via MyChart due to the restrictions of the COVID-19 pandemic. All issues as above were discussed and addressed. Physical exam was done as above through visual confirmation on MyChart. If it was felt that the patient should be evaluated in the office, they were directed there. The patient verbally consented to this visit. 1. Location of the patient: Home 2. Location of the provider: Office 3. Those involved with this call:  ? Provider: Jon Billings, NP ? CMA: Tiffany Reel, CMA ? Front Desk/Registration: Jill Side 4. Time spent on call: 14 minutes with patient face to face via video conference. More than 50% of this time was spent in counseling and coordination of care. 20 minutes total spent in review of patient's record and preparation of their chart.

## 2020-07-16 DIAGNOSIS — S6992XA Unspecified injury of left wrist, hand and finger(s), initial encounter: Secondary | ICD-10-CM | POA: Diagnosis not present

## 2020-07-16 DIAGNOSIS — S62667A Nondisplaced fracture of distal phalanx of left little finger, initial encounter for closed fracture: Secondary | ICD-10-CM | POA: Diagnosis not present

## 2020-09-23 ENCOUNTER — Ambulatory Visit
Admission: RE | Admit: 2020-09-23 | Discharge: 2020-09-23 | Disposition: A | Discharge status: Home / Self Care | Payer: Medicare Other | Source: Ambulatory Visit | Attending: Nurse Practitioner | Admitting: Nurse Practitioner

## 2020-09-23 ENCOUNTER — Ambulatory Visit
Admission: RE | Admit: 2020-09-23 | Discharge: 2020-09-23 | Disposition: A | Discharge status: Home / Self Care | Payer: Medicare Other | Source: Home / Self Care | Attending: Nurse Practitioner | Admitting: Nurse Practitioner

## 2020-09-23 ENCOUNTER — Ambulatory Visit (INDEPENDENT_AMBULATORY_CARE_PROVIDER_SITE_OTHER): Payer: Medicare Other | Admitting: Nurse Practitioner

## 2020-09-23 ENCOUNTER — Other Ambulatory Visit: Payer: Self-pay

## 2020-09-23 ENCOUNTER — Encounter: Payer: Self-pay | Admitting: Nurse Practitioner

## 2020-09-23 VITALS — BP 127/69 | HR 85 | Temp 98.0°F | Ht 71.0 in | Wt 202.6 lb

## 2020-09-23 DIAGNOSIS — F419 Anxiety disorder, unspecified: Secondary | ICD-10-CM | POA: Diagnosis not present

## 2020-09-23 DIAGNOSIS — R059 Cough, unspecified: Secondary | ICD-10-CM | POA: Diagnosis not present

## 2020-09-23 DIAGNOSIS — R0602 Shortness of breath: Secondary | ICD-10-CM | POA: Diagnosis not present

## 2020-09-23 DIAGNOSIS — E785 Hyperlipidemia, unspecified: Secondary | ICD-10-CM

## 2020-09-23 DIAGNOSIS — J069 Acute upper respiratory infection, unspecified: Secondary | ICD-10-CM

## 2020-09-23 DIAGNOSIS — I129 Hypertensive chronic kidney disease with stage 1 through stage 4 chronic kidney disease, or unspecified chronic kidney disease: Secondary | ICD-10-CM | POA: Diagnosis not present

## 2020-09-23 DIAGNOSIS — I10 Essential (primary) hypertension: Secondary | ICD-10-CM | POA: Diagnosis not present

## 2020-09-23 MED ORDER — FENOFIBRATE 145 MG PO TABS: 145.0000 mg | ORAL_TABLET | Freq: Every day | ORAL | 1 refills | Status: DC

## 2020-09-23 MED ORDER — PREDNISONE 10 MG PO TABS: ORAL_TABLET | ORAL | 0 refills | Status: DC

## 2020-09-23 MED ORDER — AMLODIPINE BESYLATE 10 MG PO TABS: 10.0000 mg | ORAL_TABLET | Freq: Every day | ORAL | 1 refills | Status: DC

## 2020-09-23 MED ORDER — LOSARTAN POTASSIUM 100 MG PO TABS: 100.0000 mg | ORAL_TABLET | Freq: Every day | ORAL | 1 refills | Status: DC

## 2020-09-23 MED ORDER — HYDROCOD POLST-CPM POLST ER 10-8 MG/5ML PO SUER: 5.0000 mL | Freq: Every evening | ORAL | 0 refills | Status: DC | PRN

## 2020-09-23 MED ORDER — ALBUTEROL SULFATE HFA 108 (90 BASE) MCG/ACT IN AERS: 2.0000 | INHALATION_SPRAY | Freq: Four times a day (QID) | RESPIRATORY_TRACT | 2 refills | Status: DC | PRN

## 2020-09-23 NOTE — Assessment & Plan Note (Addendum)
Chronic, stable. Continue current regimen. Check lipid panel today. Refills sent to the pharmacy. Follow-up in 6 months.

## 2020-09-23 NOTE — Progress Notes (Signed)
Established Patient Office Visit  Subjective:  Patient ID: Chris Daugherty, male    DOB: 1955-04-30  Age: 65 y.o. MRN: [id number]  CC:  Chief Complaint  Patient presents with   URI    Pt states he has been having congestion, wheezing, and sinus pressure   Hyperlipidemia   Hypertension    HPI Chris Daugherty presents for follow up on hypertension, hyperlipidemia, and nasal congestion  HYPERTENSION / HYPERLIPIDEMIA  Satisfied with current treatment? yes Duration of hypertension: chronic BP monitoring frequency: not checking BP range: n/a BP medication side effects: no Past BP meds: amlodipine and losartan (cozaar) Duration of hyperlipidemia: chronic Cholesterol medication side effects: no Cholesterol supplements: none Past cholesterol medications: fenofibrate (tricor) Medication compliance: excellent compliance Aspirin: no Recent stressors: no Recurrent headaches: no Visual changes: no Palpitations: no Dyspnea: yes--with URI Chest pain: no Lower extremity edema: no Dizzy/lightheaded: no   UPPER RESPIRATORY TRACT INFECTION  Had covid-19 in January. Off and since then having a cough. However nasal congestion and symptoms worsened over the last week. Has had sinus trouble 3-4 times a year since a child. He has seen ENT with no known causes for frequent infections.   Worst symptom: nasal congestion Fever: no Cough: yes Shortness of breath: yes, intermittent Wheezing: yes Chest pain: no Chest tightness: yes Chest congestion: yes Nasal congestion: yes Runny nose: yes Post nasal drip: no Sneezing: yes Sore throat: yes Swollen glands: no Sinus pressure: yes Headache: yes Face pain: no Toothache: no Ear pain: no  Ear pressure: no  Eyes red/itching:yes Eye drainage/crusting: yes  Vomiting: no Rash: no Fatigue: yes Sick contacts: no Strep contacts: no  Context: worse Recurrent sinusitis: no Relief with OTC cold/cough medications: no  Treatments attempted:   nyquil     Past Medical History:  Diagnosis Date   Acid reflux    Allergic rhinitis    Allergy    Anxiety    Depression    Hyperlipidemia    Hypertension    Testicular hypofunction     Past Surgical History:  Procedure Laterality Date   BASAL CELL CARCINOMA EXCISION     left side of face   CHOLECYSTECTOMY     COLONOSCOPY  04/30/2014   GASTRIC FUNDOPLICATION     INGUINAL HERNIA REPAIR     PARTIAL NEPHRECTOMY Left    VASECTOMY      Family History  Problem Relation Age of Onset   Cancer Maternal Grandfather 35       stomaCH   Hypertension Mother     Social History   Socioeconomic History   Marital status: Married    Spouse name: Not on file   Number of children: Not on file   Years of education: Not on file   Highest education level: Not on file  Occupational History   Not on file  Tobacco Use   Smoking status: Former    Pack years: 0.00    Types: Cigarettes    Quit date: 11/13/1974    Years since quitting: 45.8   Smokeless tobacco: Never  Vaping Use   Vaping Use: Never used  Substance and Sexual Activity   Alcohol use: No   Drug use: No   Sexual activity: Yes  Other Topics Concern   Not on file  Social History Narrative   Not on file   Social Determinants of Health   Financial Resource Strain: Not on file  Food Insecurity: Not on file  Transportation Needs: Not on file  Physical Activity: Not  on file  Stress: Not on file  Social Connections: Not on file  Intimate Partner Violence: Not on file    Outpatient Medications Prior to Visit  Medication Sig Dispense Refill   clotrimazole-betamethasone (LOTRISONE) cream Apply 1 application topically 2 (two) times daily. 30 g 1   methocarbamol (ROBAXIN) 750 MG tablet TAKE ONE TABLET BY MOUTH TWICE A DAY 180 tablet 0   omeprazole (PRILOSEC) 20 MG capsule TAKE ONE CAPSULE BY MOUTH DAILY 90 capsule 0   promethazine (PHENERGAN) 25 MG tablet TAKE ONE TABLET BY MOUTH EVERY SIX HOURS AS NEEDED FOR NAUSEA AND  VOMITING 270 tablet 1   sertraline (ZOLOFT) 100 MG tablet Take 2 tablets (200 mg total) by mouth daily. 180 tablet 0   SUMAtriptan (IMITREX) 100 MG tablet Take 1 tablet (100 mg total) by mouth daily as needed. 10 tablet 3   triamcinolone (KENALOG) 0.1 % Apply 1 application topically 2 (two) times daily. 30 g 0   triamcinolone cream (KENALOG) 0.1 % Apply 1 application topically once a week. 30 g 0   albuterol (VENTOLIN HFA) 108 (90 Base) MCG/ACT inhaler Inhale 2 puffs into the lungs every 6 (six) hours as needed for wheezing or shortness of breath. 8 g 0   amLODipine (NORVASC) 10 MG tablet Take 1 tablet (10 mg total) by mouth daily. 90 tablet 0   fenofibrate (TRICOR) 145 MG tablet Take 1 tablet (145 mg total) by mouth daily. 90 tablet 1   losartan (COZAAR) 100 MG tablet Take 1 tablet (100 mg total) by mouth daily. 90 tablet 0   benzonatate (TESSALON) 100 MG capsule Take 1 capsule (100 mg total) by mouth 2 (two) times daily as needed for cough. 40 capsule 0   chlorpheniramine-HYDROcodone (TUSSIONEX PENNKINETIC ER) 10-8 MG/5ML SUER Take 5 mLs by mouth 2 (two) times daily. 115 mL 0   doxycycline (VIBRA-TABS) 100 MG tablet Take 1 tablet (100 mg total) by mouth 2 (two) times daily. 20 tablet 0   No facility-administered medications prior to visit.    No Known Allergies  ROS Review of Systems  Constitutional:  Negative for fatigue and fever.  HENT:  Positive for congestion, rhinorrhea, sinus pressure and sore throat. Negative for ear pain and postnasal drip.   Respiratory:  Positive for cough, chest tightness and shortness of breath.   Cardiovascular: Negative.   Gastrointestinal: Negative.   Genitourinary: Negative.   Musculoskeletal: Negative.   Skin: Negative.   Neurological: Negative.   Psychiatric/Behavioral: Negative.       Objective:    Physical Exam Vitals and nursing note reviewed.  Constitutional:      Appearance: Normal appearance.  HENT:     Head: Normocephalic.     Right  Ear: Tympanic membrane, ear canal and external ear normal.     Left Ear: Tympanic membrane, ear canal and external ear normal.     Mouth/Throat:     Mouth: Mucous membranes are moist.     Pharynx: Oropharynx is clear.  Eyes:     Conjunctiva/sclera: Conjunctivae normal.  Cardiovascular:     Rate and Rhythm: Normal rate and regular rhythm.     Pulses: Normal pulses.     Heart sounds: Normal heart sounds.  Pulmonary:     Effort: Pulmonary effort is normal.     Breath sounds: Normal breath sounds.  Abdominal:     Palpations: Abdomen is soft.     Tenderness: There is no abdominal tenderness.  Musculoskeletal:     Cervical back: Normal  range of motion and neck supple.     Right lower leg: No edema.     Left lower leg: No edema.  Lymphadenopathy:     Cervical: No cervical adenopathy.  Skin:    General: Skin is warm and dry.  Neurological:     General: No focal deficit present.     Mental Status: He is alert and oriented to person, place, and time.  Psychiatric:        Mood and Affect: Mood normal.        Behavior: Behavior normal.        Thought Content: Thought content normal.        Judgment: Judgment normal.    BP 127/69   Pulse 85   Temp 98 F (36.7 C) (Oral)   Ht _0  (1.803 m)   Wt 202 lb 9.6 oz (91.9 kg)   SpO2 96%   BMI 28.26 kg/m  Wt Readings from Last 3 Encounters:  09/23/20 202 lb 9.6 oz (91.9 kg)  05/26/20 200 lb (90.7 kg)  09/29/19 201 lb (91.2 kg)     Health Maintenance Due  Topic Date Due   Zoster Vaccines- Shingrix (1 of 2) Never done   COVID-19 Vaccine (4 - Booster for Pfizer series) 04/03/2020    There are no preventive care reminders to display for this patient.  Lab Results  Component Value Date   TSH 3.370 02/02/2017   Lab Results  Component Value Date   WBC 3.3 (L) 02/02/2017   HGB 13.5 02/02/2017   HCT 39.8 02/02/2017   MCV 83 02/02/2017   PLT 160 02/02/2017   Lab Results  Component Value Date   NA 140 03/31/2020   K 4.3  03/31/2020   CO2 25 03/31/2020   GLUCOSE 85 03/31/2020   BUN 17 03/31/2020   CREATININE 1.31 (H) 03/31/2020   BILITOT 0.3 09/29/2019   ALKPHOS 52 09/29/2019   AST 20 09/29/2019   ALT 16 09/29/2019   PROT 6.8 09/29/2019   ALBUMIN 4.9 (H) 09/29/2019   CALCIUM 9.7 03/31/2020   Lab Results  Component Value Date   CHOL 188 03/31/2020   Lab Results  Component Value Date   HDL 39 (L) 03/31/2020   Lab Results  Component Value Date   LDLCALC 114 (H) 03/31/2020   Lab Results  Component Value Date   TRIG 197 (H) 03/31/2020   Lab Results  Component Value Date   CHOLHDL 4.8 03/31/2020   Lab Results  Component Value Date   HGBA1C 5.0 04/21/2016      Assessment & Plan:   Problem List Items Addressed This Visit       Cardiovascular and Mediastinum   Essential (primary) hypertension    Chronic, stable. Continue current regimen. Check CMP and CBC today. Refills sent to the pharmacy. Follow-up in 6 months.        Relevant Medications   amLODipine (NORVASC) 10 MG tablet   fenofibrate (TRICOR) 145 MG tablet   losartan (COZAAR) 100 MG tablet   Other Relevant Orders   Comp Met (CMET)   CBC with Differential     Genitourinary   Hypertensive renal disease    Chronic, stable. Checking CMP today. Will treat and adjust regimen based on results.        Relevant Medications   amLODipine (NORVASC) 10 MG tablet   losartan (COZAAR) 100 MG tablet     Other   Anxiety - Primary    Chronic, stable. Continue current regimen.  Follow up in 6 months.        Hyperlipidemia    Chronic, stable. Continue current regimen. Check lipid panel today. Refills sent to the pharmacy. Follow-up in 6 months.        Relevant Medications   amLODipine (NORVASC) 10 MG tablet   fenofibrate (TRICOR) 145 MG tablet   losartan (COZAAR) 100 MG tablet   Other Relevant Orders   Lipid Panel w/o Chol/HDL Ratio   Other Visit Diagnoses     Viral upper respiratory tract infection       Check covid-19  test today. Will treat with prednisone and tussionex. Refilled albuterol. CXR today with wheezing and cough. Rest and encourage fluids   Relevant Orders   Novel Coronavirus, NAA ([facility])   DG Chest 2 View       Meds ordered this encounter  Medications   amLODipine (NORVASC) 10 MG tablet    Sig: Take 1 tablet (10 mg total) by mouth daily.    Dispense:  90 tablet    Refill:  1   albuterol (VENTOLIN HFA) 108 (90 Base) MCG/ACT inhaler    Sig: Inhale 2 puffs into the lungs every 6 (six) hours as needed for wheezing or shortness of breath.    Dispense:  8 g    Refill:  2   fenofibrate (TRICOR) 145 MG tablet    Sig: Take 1 tablet (145 mg total) by mouth daily.    Dispense:  90 tablet    Refill:  1   losartan (COZAAR) 100 MG tablet    Sig: Take 1 tablet (100 mg total) by mouth daily.    Dispense:  90 tablet    Refill:  1   predniSONE (DELTASONE) 10 MG tablet    Sig: Take 6 tablets today, then 5 tablets tomorrow, then decrease by 1 tablet every day until gone    Dispense:  21 tablet    Refill:  0   chlorpheniramine-HYDROcodone (TUSSIONEX PENNKINETIC ER) 10-8 MG/5ML SUER    Sig: Take 5 mLs by mouth at bedtime as needed for cough.    Dispense:  115 mL    Refill:  0     Follow-up: Return in about 6 months (around 03/25/2021) for htn, hld.    Charyl Dancer, NP

## 2020-09-23 NOTE — Patient Instructions (Addendum)
Nasal saline spray over the counter twice a day can help with congestion Sending in prednisone, steroid, to help with wheezing and symptoms Check a chest xray at the [facility] office Get plenty of rest, and drink fluids

## 2020-09-23 NOTE — Assessment & Plan Note (Signed)
Chronic, stable. Continue current regimen. Follow up in 6 months.

## 2020-09-23 NOTE — Assessment & Plan Note (Signed)
Chronic, stable. Checking CMP today. Will treat and adjust regimen based on results.

## 2020-09-23 NOTE — Assessment & Plan Note (Addendum)
Chronic, stable. Continue current regimen. Check CMP and CBC today. Refills sent to the pharmacy. Follow-up in 6 months.

## 2020-09-24 LAB — COMPREHENSIVE METABOLIC PANEL
ALT: 16 IU/L (ref 0–44)
AST: 21 IU/L (ref 0–40)
Albumin/Globulin Ratio: 2.9 — ABNORMAL HIGH (ref 1.2–2.2)
Albumin: 5 g/dL — ABNORMAL HIGH (ref 3.8–4.8)
Alkaline Phosphatase: 50 IU/L (ref 44–121)
BUN/Creatinine Ratio: 21 (ref 10–24)
BUN: 32 mg/dL — ABNORMAL HIGH (ref 8–27)
Bilirubin Total: 0.3 mg/dL (ref 0.0–1.2)
CO2: 24 mmol/L (ref 20–29)
Calcium: 9.4 mg/dL (ref 8.6–10.2)
Chloride: 101 mmol/L (ref 96–106)
Creatinine, Ser: 1.49 mg/dL — ABNORMAL HIGH (ref 0.76–1.27)
Globulin, Total: 1.7 g/dL (ref 1.5–4.5)
Glucose: 98 mg/dL (ref 65–99)
Potassium: 3.8 mmol/L (ref 3.5–5.2)
Sodium: 141 mmol/L (ref 134–144)
Total Protein: 6.7 g/dL (ref 6.0–8.5)
eGFR: 52 mL/min/{1.73_m2} — ABNORMAL LOW (ref >59)

## 2020-09-24 LAB — LIPID PANEL W/O CHOL/HDL RATIO
Cholesterol, Total: 172 mg/dL (ref 100–199)
HDL: 39 mg/dL — ABNORMAL LOW (ref >39)
LDL Chol Calc (NIH): 105 mg/dL — ABNORMAL HIGH (ref 0–99)
Triglycerides: 156 mg/dL — ABNORMAL HIGH (ref 0–149)
VLDL Cholesterol Cal: 28 mg/dL (ref 5–40)

## 2020-09-24 LAB — CBC WITH DIFFERENTIAL/PLATELET
Basophils Absolute: 0.1 10*3/uL (ref 0.0–0.2)
Basos: 2 %
EOS (ABSOLUTE): 0.6 10*3/uL — ABNORMAL HIGH (ref 0.0–0.4)
Eos: 13 %
Hematocrit: 37.9 % (ref 37.5–51.0)
Hemoglobin: 13.1 g/dL (ref 13.0–17.7)
Immature Grans (Abs): 0 10*3/uL (ref 0.0–0.1)
Immature Granulocytes: 0 %
Lymphocytes Absolute: 0.8 10*3/uL (ref 0.7–3.1)
Lymphs: 18 %
MCH: 28.7 pg (ref 26.6–33.0)
MCHC: 34.6 g/dL (ref 31.5–35.7)
MCV: 83 fL (ref 79–97)
Monocytes Absolute: 0.4 10*3/uL (ref 0.1–0.9)
Monocytes: 8 %
Neutrophils Absolute: 2.6 10*3/uL (ref 1.4–7.0)
Neutrophils: 59 %
Platelets: 137 10*3/uL — ABNORMAL LOW (ref 150–450)
RBC: 4.56 x10E6/uL (ref 4.14–5.80)
RDW: 13.4 % (ref 11.6–15.4)
WBC: 4.4 10*3/uL (ref 3.4–10.8)

## 2020-09-24 LAB — NOVEL CORONAVIRUS, NAA: SARS-CoV-2, NAA: NOT DETECTED

## 2020-09-24 LAB — SARS-COV-2, NAA 2 DAY TAT

## 2020-09-27 ENCOUNTER — Ambulatory Visit: Payer: Medicare Other | Admitting: Nurse Practitioner

## 2020-10-07 ENCOUNTER — Other Ambulatory Visit: Payer: Self-pay | Admitting: Family Medicine

## 2020-10-07 DIAGNOSIS — K219 Gastro-esophageal reflux disease without esophagitis: Secondary | ICD-10-CM

## 2020-10-27 ENCOUNTER — Ambulatory Visit: Payer: Self-pay | Admitting: *Deleted

## 2020-10-27 NOTE — Telephone Encounter (Signed)
Patient C/O chronic intermittent SOB with wheezing at times including cough and nasal congestion. Denies dizziness/fever. Had Covid in January 2022 been vaccinated. Stated "feels like Long Covid" from his research. Using albuterol inhaler once sometimes twice daily-does not help. Was seen in June and prescribed prednisone and had cxay on 09/23/20. Appointment made for morning of 10/28/20 with pcp via DT.

## 2020-10-28 ENCOUNTER — Ambulatory Visit (INDEPENDENT_AMBULATORY_CARE_PROVIDER_SITE_OTHER): Payer: Medicare Other | Admitting: Nurse Practitioner

## 2020-10-28 ENCOUNTER — Other Ambulatory Visit: Payer: Self-pay

## 2020-10-28 ENCOUNTER — Encounter: Payer: Self-pay | Admitting: Nurse Practitioner

## 2020-10-28 VITALS — BP 144/83 | HR 80 | Temp 98.4°F | Wt 203.0 lb

## 2020-10-28 DIAGNOSIS — R053 Chronic cough: Secondary | ICD-10-CM | POA: Diagnosis not present

## 2020-10-28 DIAGNOSIS — U099 Post covid-19 condition, unspecified: Secondary | ICD-10-CM

## 2020-10-28 MED ORDER — PREDNISONE 10 MG PO TABS: 10.0000 mg | ORAL_TABLET | Freq: Every day | ORAL | 0 refills | Status: DC

## 2020-10-28 NOTE — Progress Notes (Signed)
BP (!) 144/83   Pulse 80   Temp 98.4 F (36.9 C)   Wt 203 lb (92.1 kg)   SpO2 100%   BMI 28.31 kg/m    Subjective:    Patient ID: Chris Daugherty, male    DOB: [birth year], 65 y.o.   MRN: [id number]  HPI: Chris Daugherty is a 65 y.o. male  Chief Complaint  Patient presents with   Cough    Patient had covid in January, since then he states that the cough comes and goes   COUGH Patient had Covid in January and states he has been coughing since then. The cough has been off and on since January.  Has had instances where it is not associated with congestion.  Saw improvement with steroids after last visit. Duration: months Circumstances of initial development of cough:  COVID in january Cough severity: moderate Cough description: non-productive, dry, and irritating Aggravating factors:  worse at night Alleviating factors: nothing Status:  stable Treatments attempted: albuterol Wheezing: yes at night Shortness of breath: yes Chest pain: no Chest tightness:no Nasal congestion: yes off and on since January Runny nose: yes Postnasal drip: yes Frequent throat clearing or swallowing: yes Hemoptysis: no Fevers: no Night sweats: no Weight loss: no Heartburn: no Recent foreign travel: no Tuberculosis contacts: no  Relevant past medical, surgical, family and social history reviewed and updated as indicated. Interim medical history since our last visit reviewed. Allergies and medications reviewed and updated.  Review of Systems  HENT:  Positive for congestion.   Respiratory:  Positive for cough.    Per HPI unless specifically indicated above     Objective:    BP (!) 144/83   Pulse 80   Temp 98.4 F (36.9 C)   Wt 203 lb (92.1 kg)   SpO2 100%   BMI 28.31 kg/m   Wt Readings from Last 3 Encounters:  10/28/20 203 lb (92.1 kg)  09/23/20 202 lb 9.6 oz (91.9 kg)  05/26/20 200 lb (90.7 kg)    Physical Exam Vitals and nursing note reviewed.  Constitutional:       General: He is not in acute distress.    Appearance: Normal appearance. He is not ill-appearing, toxic-appearing or diaphoretic.  HENT:     Head: Normocephalic.     Right Ear: Tympanic membrane and external ear normal.     Left Ear: Tympanic membrane and external ear normal.     Nose: Nose normal. No congestion or rhinorrhea.     Mouth/Throat:     Mouth: Mucous membranes are moist.     Pharynx: No oropharyngeal exudate or posterior oropharyngeal erythema.  Eyes:     General:        Right eye: No discharge.        Left eye: No discharge.     Extraocular Movements: Extraocular movements intact.     Conjunctiva/sclera: Conjunctivae normal.     Pupils: Pupils are equal, round, and reactive to light.  Cardiovascular:     Rate and Rhythm: Normal rate and regular rhythm.     Heart sounds: No murmur heard. Pulmonary:     Effort: Pulmonary effort is normal. No respiratory distress.     Breath sounds: Normal breath sounds. No wheezing, rhonchi or rales.  Abdominal:     General: Abdomen is flat. Bowel sounds are normal.  Musculoskeletal:     Cervical back: Normal range of motion and neck supple.  Skin:    General: Skin is warm and dry.  Capillary Refill: Capillary refill takes less than 2 seconds.  Neurological:     General: No focal deficit present.     Mental Status: He is alert and oriented to person, place, and time.  Psychiatric:        Mood and Affect: Mood normal.        Behavior: Behavior normal.        Thought Content: Thought content normal.        Judgment: Judgment normal.    Results for orders placed or performed in visit on 09/23/20  Novel Coronavirus, NAA ([facility])   Specimen: Nasopharyngeal(NP) swabs in vial transport medium  Result Value Ref Range   SARS-CoV-2, NAA Not Detected Not Detected  SARS-COV-2, NAA 2 DAY TAT  Result Value Ref Range   SARS-CoV-2, NAA 2 DAY TAT Performed   Comp Met (CMET)  Result Value Ref Range   Glucose 98 65 - 99 mg/dL   BUN 32  (H) 8 - 27 mg/dL   Creatinine, Ser 1.49 (H) 0.76 - 1.27 mg/dL   eGFR 52 (L) >59 mL/min/1.73   BUN/Creatinine Ratio 21 10 - 24   Sodium 141 134 - 144 mmol/L   Potassium 3.8 3.5 - 5.2 mmol/L   Chloride 101 96 - 106 mmol/L   CO2 24 20 - 29 mmol/L   Calcium 9.4 8.6 - 10.2 mg/dL   Total Protein 6.7 6.0 - 8.5 g/dL   Albumin 5.0 (H) 3.8 - 4.8 g/dL   Globulin, Total 1.7 1.5 - 4.5 g/dL   Albumin/Globulin Ratio 2.9 (H) 1.2 - 2.2   Bilirubin Total 0.3 0.0 - 1.2 mg/dL   Alkaline Phosphatase 50 44 - 121 IU/L   AST 21 0 - 40 IU/L   ALT 16 0 - 44 IU/L  CBC with Differential  Result Value Ref Range   WBC 4.4 3.4 - 10.8 x10E3/uL   RBC 4.56 4.14 - 5.80 x10E6/uL   Hemoglobin 13.1 13.0 - 17.7 g/dL   Hematocrit 37.9 37.5 - 51.0 %   MCV 83 79 - 97 fL   MCH 28.7 26.6 - 33.0 pg   MCHC 34.6 31.5 - 35.7 g/dL   RDW 13.4 11.6 - 15.4 %   Platelets 137 (L) 150 - 450 x10E3/uL   Neutrophils 59 Not Estab. %   Lymphs 18 Not Estab. %   Monocytes 8 Not Estab. %   Eos 13 Not Estab. %   Basos 2 Not Estab. %   Neutrophils Absolute 2.6 1.4 - 7.0 x10E3/uL   Lymphocytes Absolute 0.8 0.7 - 3.1 x10E3/uL   Monocytes Absolute 0.4 0.1 - 0.9 x10E3/uL   EOS (ABSOLUTE) 0.6 (H) 0.0 - 0.4 x10E3/uL   Basophils Absolute 0.1 0.0 - 0.2 x10E3/uL   Immature Granulocytes 0 Not Estab. %   Immature Grans (Abs) 0.0 0.0 - 0.1 x10E3/uL  Lipid Panel w/o Chol/HDL Ratio  Result Value Ref Range   Cholesterol, Total 172 100 - 199 mg/dL   Triglycerides 156 (H) 0 - 149 mg/dL   HDL 39 (L) >39 mg/dL   VLDL Cholesterol Cal 28 5 - 40 mg/dL   LDL Chol Calc (NIH) 105 (H) 0 - 99 mg/dL      Assessment & Plan:   Problem List Items Addressed This Visit   None Visit Diagnoses     Post-COVID chronic cough    -  Primary   Patient given steroid tab  x12 days. Referral placed for pulmonology to have further evaluation and treatment.    Relevant  Orders   Ambulatory referral to Pulmonology        Follow up plan: Return if symptoms worsen  or fail to improve.

## 2020-10-28 NOTE — Telephone Encounter (Signed)
Routing to provider as an Pharmacist, hospital. Patient is seeing you this morning.

## 2020-11-04 ENCOUNTER — Other Ambulatory Visit: Payer: Self-pay | Admitting: Nurse Practitioner

## 2020-11-13 ENCOUNTER — Other Ambulatory Visit: Payer: Self-pay | Admitting: Nurse Practitioner

## 2020-11-14 NOTE — Telephone Encounter (Signed)
Requested medication (s) are due for refill today: -  Requested medication (s) are on the active medication list: yes  Last refill:  10/28/20 #42  Future visit scheduled: yes  Notes to clinic:  med not delegated to NT to RF   Requested Prescriptions  Pending Prescriptions Disp Refills   predniSONE ([place]) 10 MG tablet [Pharmacy Med Name: predniSONE 10 MG TABLET] 42 tablet 0    Sig: TAKE 6 TABLETS DAILY FOR 2 DAYS THEN 5 DAILY FOR 2 DAYS THEN 4 FOR 2 DAYS THEN 3 FOR 2 DAYS THEN 2 FOR 2 DAYS THEN 1 FOR 2 DAYS     Not Delegated - Endocrinology:  Oral Corticosteroids Failed - 11/13/2020 11:11 PM      Failed - This refill cannot be delegated      Failed - Last BP in normal range    BP Readings from Last 1 Encounters:  10/28/20 (!) 144/83          Passed - Valid encounter within last 6 months    Recent Outpatient Visits           2 weeks ago Post-COVID chronic cough   [facility] Jon Billings, NP   1 month ago [place], Lauren A, NP   4 months ago Viral upper respiratory tract infection   [facility] Jon Billings, NP   5 months ago [place], Karen, NP   7 months ago Hypertensive renal disease   [place], Jake Church, DO       Future Appointments             In 4 months Jon Billings, NP [facility], [place]

## 2020-12-14 ENCOUNTER — Telehealth: Payer: Self-pay

## 2020-12-14 ENCOUNTER — Other Ambulatory Visit: Payer: Self-pay

## 2020-12-14 ENCOUNTER — Encounter: Payer: Self-pay | Admitting: Internal Medicine

## 2020-12-14 ENCOUNTER — Ambulatory Visit: Payer: Medicare Other | Admitting: Internal Medicine

## 2020-12-14 ENCOUNTER — Other Ambulatory Visit
Admission: RE | Admit: 2020-12-14 | Discharge: 2020-12-14 | Disposition: A | Discharge status: Home / Self Care | Payer: Medicare Other | Source: Ambulatory Visit | Attending: Internal Medicine | Admitting: Internal Medicine

## 2020-12-14 DIAGNOSIS — R058 Other specified cough: Secondary | ICD-10-CM | POA: Diagnosis not present

## 2020-12-14 LAB — CBC WITH DIFFERENTIAL/PLATELET
Abs Immature Granulocytes: 0.01 10*3/uL (ref 0.00–0.07)
Basophils Absolute: 0.1 10*3/uL (ref 0.0–0.1)
Basophils Relative: 1 %
Eosinophils Absolute: 0.2 10*3/uL (ref 0.0–0.5)
Eosinophils Relative: 4 %
HCT: 38.6 % — ABNORMAL LOW (ref 39.0–52.0)
Hemoglobin: 13.1 g/dL (ref 13.0–17.0)
Immature Granulocytes: 0 %
Lymphocytes Relative: 20 %
Lymphs Abs: 0.9 10*3/uL (ref 0.7–4.0)
MCH: 27.7 pg (ref 26.0–34.0)
MCHC: 33.9 g/dL (ref 30.0–36.0)
MCV: 81.6 fL (ref 80.0–100.0)
Monocytes Absolute: 0.4 10*3/uL (ref 0.1–1.0)
Monocytes Relative: 8 %
Neutro Abs: 3 10*3/uL (ref 1.7–7.7)
Neutrophils Relative %: 67 %
Platelets: 159 10*3/uL (ref 150–400)
RBC: 4.73 MIL/uL (ref 4.22–5.81)
RDW: 13.7 % (ref 11.5–15.5)
WBC: 4.6 10*3/uL (ref 4.0–10.5)
nRBC: 0 % (ref 0.0–0.2)

## 2020-12-14 MED ORDER — GABAPENTIN 100 MG PO CAPS: ORAL_CAPSULE | ORAL | 2 refills | Status: DC

## 2020-12-14 MED ORDER — PREDNISONE 10 MG PO TABS: ORAL_TABLET | ORAL | 0 refills | Status: DC

## 2020-12-14 MED ORDER — FAMOTIDINE 20 MG PO TABS: ORAL_TABLET | ORAL | 11 refills | Status: DC

## 2020-12-14 MED ORDER — PANTOPRAZOLE SODIUM 40 MG PO TBEC: 40.0000 mg | DELAYED_RELEASE_TABLET | Freq: Every day | ORAL | 2 refills | Status: DC

## 2020-12-14 NOTE — Telephone Encounter (Signed)
Called and LVM for patient in regards to upcoming appointment today.

## 2020-12-14 NOTE — Patient Instructions (Addendum)
Pantoprazole (protonix) 40 mg   Take  30-60 min before first meal of the day and Pepcid (famotidine)  20 mg after supper until return to office - this is the best way to tell whether stomach acid is contributing to your problem.     GERD (REFLUX)  is an extremely common cause of respiratory symptoms just like yours , many times with no obvious heartburn at all.    It can be treated with medication, but also with lifestyle changes including elevation of the head of your bed (ideally with 6 -8inch blocks under the headboard of your bed),  Smoking cessation, avoidance of late meals, excessive alcohol, and avoid fatty foods, chocolate, peppermint, colas, red wine, and acidic juices such as orange juice.  NO MINT OR MENTHOL PRODUCTS SO NO COUGH DROPS  USE SUGARLESS CANDY INSTEAD (Jolley ranchers or Stover's or Life Savers) or even ice chips will also do - the key is to swallow to prevent all throat clearing. NO OIL BASED VITAMINS - use powdered substitutes.  Avoid fish oil when coughing.    Prednisone 10 mg take  4 each am x 2 days,   2 each am x 2 days,  1 each am x 2 days and stop    For cough > delsym  2 tsp every 12 hours as needed   Gabapentin 100 mg one per day for 5 days, then 2 x 5 days, then 3 x 5 days then 4 x days    Please remember to go to the lab department   for your tests - we will call you with the results when they are available.      Please schedule a follow up office visit in 6 weeks, call sooner if needed

## 2020-12-14 NOTE — Telephone Encounter (Signed)
Pt returning missed call. 

## 2020-12-14 NOTE — Progress Notes (Signed)
Chris Daugherty, male    DOB: 01-22-1956,    MRN: [id number]   Brief patient profile:  28 yowm minimal smoking hx and tendency to nasal obstruction assoc with "bronchitis"  ent eval neg aroiund 2008  referred to pulmonary clinic in [facility]  12/14/2020 by Jon Billings NP  for post covid Jan 2022 sensation of sob and cough.       History of Present Illness  12/14/2020  Pulmonary/ 1st office eval/ Jesilyn Easom / [employer]  Chief Complaint  Patient presents with   Consult    Covid in January and has had a cough since. Feels winded sometimes.    Dyspnea:  Not limited by breathing from desired activities  but feels tight  sensation with deep breathing at rest. Cough: not daily / sporadic, sensation of globus 75% improved overall but in retrospect has the same chronic sensation he's had even before covid of "something stuck in his throat"  Sleep: on side bed is flat, cough doesn't typically wake him or flare first thing in am  SABA use: saba can't tell helps Overt hb on prilosec 20 mg daily not ac   No obvious day to day or daytime variability or assoc excess/ purulent sputum or mucus plugs or hemoptysis or cp or chest tightness, subjective wheeze or overt sinus   symptoms.   Sleeping  without nocturnal  or early am exacerbation  of respiratory  c/o's or need for noct saba. Also denies any obvious fluctuation of symptoms with weather or environmental changes or other aggravating or alleviating factors except as outlined above   No unusual exposure hx or h/o childhood pna/ asthma or knowledge of premature birth.  Current Allergies, Complete Past Medical History, Past Surgical History, Family History, and Social History were reviewed in [employer] record.  ROS  The following are not active complaints unless bolded Hoarseness, sore throat = globus  dysphagia, dental problems, itching, sneezing,  nasal congestion or discharge of excess mucus or purulent secretions,  ear ache,   fever, chills, sweats, unintended wt loss or wt gain, classically pleuritic or exertional cp,  orthopnea pnd or arm/hand swelling  or leg swelling, presyncope, palpitations, abdominal pain, anorexia, nausea, vomiting, diarrhea  or change in bowel habits or change in bladder habits, change in stools or change in urine, dysuria, hematuria,  rash, arthralgias, visual complaints, headache, numbness, weakness or ataxia or problems with walking or coordination,  change in mood or  memory.           Past Medical History:  Diagnosis Date   Acid reflux    Allergic rhinitis    Allergy    Anxiety    Depression    Hyperlipidemia    Hypertension    Testicular hypofunction     Outpatient Medications Prior to Visit  Medication Sig Dispense Refill   albuterol (VENTOLIN HFA) 108 (90 Base) MCG/ACT inhaler Inhale 2 puffs into the lungs every 6 (six) hours as needed for wheezing or shortness of breath. 8 g 2   amLODipine (NORVASC) 10 MG tablet Take 1 tablet (10 mg total) by mouth daily. 90 tablet 1   clotrimazole-betamethasone (LOTRISONE) cream Apply 1 application topically 2 (two) times daily. 30 g 1   fenofibrate (TRICOR) 145 MG tablet Take 1 tablet (145 mg total) by mouth daily. 90 tablet 1   losartan (COZAAR) 100 MG tablet Take 1 tablet (100 mg total) by mouth daily. 90 tablet 1   methocarbamol (ROBAXIN) 750 MG tablet  TAKE ONE TABLET BY MOUTH TWICE A DAY 180 tablet 0   omeprazole (PRILOSEC) 20 MG capsule TAKE ONE CAPSULE BY MOUTH DAILY 90 capsule 1       0   promethazine (PHENERGAN) 25 MG tablet TAKE ONE TABLET BY MOUTH EVERY SIX HOURS AS NEEDED FOR NAUSEA AND VOMITING 270 tablet 1   sertraline (ZOLOFT) 100 MG tablet TAKE TWO TABLETS BY MOUTH DAILY 180 tablet 0   SUMAtriptan (IMITREX) 100 MG tablet Take 1 tablet (100 mg total) by mouth daily as needed. 10 tablet 3   triamcinolone (KENALOG) 0.1 % Apply 1 application topically 2 (two) times daily. 30 g 0   triamcinolone cream (KENALOG) 0.1 %  Apply 1 application topically once a week. 30 g 0       Objective:     BP 118/80 (BP Location: Left Arm, Patient Position: Sitting, Cuff Size: Normal)   Pulse 86   Temp 98 F (36.7 C) (Oral)   Ht 6' (1.829 m)   Wt 198 lb 12.8 oz (90.2 kg)   SpO2 98%   BMI 26.96 kg/m   SpO2: 98 %  RA    HEENT : pt wearing mask not removed for exam due to covid -19 concerns.    NECK :  without JVD/Nodes/TM/ nl carotid upstrokes bilaterally   LUNGS: no acc muscle use,  Nl contour chest which is clear to A and P bilaterally without cough on insp or exp maneuvers   CV:  RRR  no s3 or murmur or increase in P2, and no edema   ABD:  soft and nontender with nl inspiratory excursion in the supine position. No bruits or organomegaly appreciated, bowel sounds nl  MS:  Nl gait/ ext warm without deformities, calf tenderness, cyanosis or clubbing No obvious joint restrictions   SKIN: warm and dry without lesions    NEURO:  alert, approp, nl sensorium with  no motor or cerebellar deficits apparent.     I personally reviewed images and agree with radiology impression as follows:  CXR:   PA and lat 09/23/20 No active cardiopulmonary disease.   Labs ordered 12/14/2020  :  allergy profile Assessment   Upper airway cough syndrome Onset as teenager globus sensation comes and goes, worse with uri's - ENT eval neg 2008  - Allergy profile 12/14/2020 >  Eos 0.2 /  IgE pending   - 12/14/2020 trial of max gerd rx/ gabapentin titration as high as 100 mg qid   Of the three most common causes of  Sub-acute / recurrent or chronic cough, only one (GERD)  can actually contribute to/ trigger  the other two (asthma and post nasal drip syndrome)  and perpetuate the cylce of cough.  While not intuitively obvious, many patients with chronic low grade reflux do not cough until there is a primary insult that disturbs the protective epithelial barrier and exposes sensitive nerve endings.   This is typically viral but can  due to PNDS and  either may apply here.    >>>  The point is that once this occurs, it is difficult to eliminate the cycle  using anything but a maximally effective acid suppression regimen at least in the short run, accompanied by an appropriate diet to address non acid GERD and control / eliminate the cough itself with gagapentin as above and >>> also so added 6 day taper off  Prednisone starting at 40 mg per day in case of component of Th-2 driven upper or lower airways  inflammation (if cough responds short term only to relapse before return while will on full rx for uacs (as above), then  that would point to allergic rhinitis/ asthma or eos bronchitis as alternative dx)   >>> f/u in 6 weeks with next steps ? Allergy referral vs ENT re-eval          Each maintenance medication was reviewed in detail including emphasizing most importantly the difference between maintenance and prns and under what circumstances the prns are to be triggered using an action plan format where appropriate.  Total time for H and P, chart review, counseling,   and generating customized AVS unique to this office visit / same day charting =  40 min            Christinia Gully, MD 12/14/2020

## 2020-12-14 NOTE — Telephone Encounter (Signed)
Returned patients telephone call, patient is aware of appointment today at 1:30 and will be here, nothing further needed.

## 2020-12-15 ENCOUNTER — Encounter: Payer: Self-pay | Admitting: Internal Medicine

## 2020-12-15 NOTE — Assessment & Plan Note (Signed)
Onset as teenager globus sensation comes and goes, worse with uri's - ENT eval neg 2008  - Allergy profile 12/14/2020 >  Eos 0.2 /  IgE pending   - 12/14/2020 trial of max gerd rx/ gabapentin titration as high as 100 mg qid   Of the three most common causes of  Sub-acute / recurrent or chronic cough, only one (GERD)  can actually contribute to/ trigger  the other two (asthma and post nasal drip syndrome)  and perpetuate the cylce of cough.  While not intuitively obvious, many patients with chronic low grade reflux do not cough until there is a primary insult that disturbs the protective epithelial barrier and exposes sensitive nerve endings.   This is typically viral but can due to PNDS and  either may apply here.    >>>  The point is that once this occurs, it is difficult to eliminate the cycle  using anything but a maximally effective acid suppression regimen at least in the short run, accompanied by an appropriate diet to address non acid GERD and control / eliminate the cough itself with gagapentin as above and >>> also so added 6 day taper off  Prednisone starting at 40 mg per day in case of component of Th-2 driven upper or lower airways inflammation (if cough responds short term only to relapse before return while will on full rx for uacs (as above), then  that would point to allergic rhinitis/ asthma or eos bronchitis as alternative dx)   >>> f/u in 6 weeks with next steps ? Allergy referral vs ENT re-eval          Each maintenance medication was reviewed in detail including emphasizing most importantly the difference between maintenance and prns and under what circumstances the prns are to be triggered using an action plan format where appropriate.  Total time for H and P, chart review, counseling,   and generating customized AVS unique to this office visit / same day charting =  40 min

## 2020-12-18 LAB — IGE: IgE (Immunoglobulin E), Serum: 4 IU/mL — ABNORMAL LOW (ref 6–495)

## 2020-12-24 ENCOUNTER — Encounter: Payer: Self-pay | Admitting: *Deleted

## 2020-12-24 NOTE — Progress Notes (Signed)
Letter mailed

## 2020-12-28 ENCOUNTER — Ambulatory Visit (INDEPENDENT_AMBULATORY_CARE_PROVIDER_SITE_OTHER): Payer: Medicare Other

## 2020-12-28 DIAGNOSIS — Z0000 Encounter for general adult medical examination without abnormal findings: Secondary | ICD-10-CM | POA: Diagnosis not present

## 2020-12-28 NOTE — Progress Notes (Signed)
Subjective:    Chris Daugherty is a 65 y.o. male who presents for Medicare Annual/Subsequent preventive examination.   I connected with  Chris Daugherty on 12/28/20 by an audio only telemedicine application and verified that I am speaking with the correct person using two identifiers.   I discussed the limitations, risks, security and privacy concerns of performing an evaluation and management service by telephone and the availability of in person appointments. I also discussed with the patient that there may be a patient responsible charge related to this service. The patient expressed understanding and verbally consented to this telephonic visit.  Location of Patient: Home Location of Provider: Office  List any persons and their role that are participating in the visit with the patient.   Preventive Screening-Counseling & Management  Tobacco Social History   Tobacco Use  Smoking Status Former   Types: Cigarettes   Quit date: 11/13/1974   Years since quitting: 46.1  Smokeless Tobacco Never    Problems Prior to Visit 1.   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 90 tablet 1   clotrimazole-betamethasone (LOTRISONE) cream Apply 1 application topically 2 (two) times daily. 30 g 1   famotidine (PEPCID) 20 MG tablet One after supper 30 tablet 11   fenofibrate (TRICOR) 145 MG tablet Take 1 tablet (145 mg total) by mouth daily. 90 tablet 1   gabapentin (NEURONTIN) 100 MG capsule One four times daily 120 capsule 2   losartan (COZAAR) 100 MG tablet Take 1 tablet (100 mg total) by mouth daily. 90 tablet 1   methocarbamol (ROBAXIN) 750 MG tablet TAKE ONE TABLET BY MOUTH TWICE A DAY 180 tablet 0   pantoprazole (PROTONIX) 40 MG tablet Take 1 tablet (40 mg total) by mouth daily. Take 30-60 min before first meal of the day 30 tablet 2   predniSONE (DELTASONE) 10 MG tablet Take  4 each am x 2 days,   2 each am x 2 days,  1 each am x 2 days and stop 14 tablet 0   promethazine (PHENERGAN) 25 MG tablet TAKE ONE TABLET BY MOUTH EVERY SIX HOURS AS NEEDED FOR NAUSEA AND VOMITING 270 tablet 1   sertraline (ZOLOFT) 100 MG tablet TAKE TWO TABLETS BY MOUTH DAILY 180 tablet 0   SUMAtriptan (IMITREX) 100 MG tablet Take 1 tablet (100 mg total) by mouth daily as needed. 10 tablet 3   triamcinolone (KENALOG) 0.1 % Apply 1 application topically 2 (two) times daily. 30 g 0   triamcinolone cream (KENALOG) 0.1 % Apply 1 application topically once a week. 30 g 0  No current facility-administered medications on file prior to visit.    Current Medications (verified) Current Outpatient Medications  Medication Sig Dispense Refill   amLODipine (NORVASC) 10 MG tablet Take 1 tablet (10 mg total) by mouth daily. 90 tablet 1   clotrimazole-betamethasone (LOTRISONE) cream Apply 1 application topically 2 (two) times daily. 30 g 1   famotidine (PEPCID) 20 MG tablet One after supper 30 tablet 11   fenofibrate (TRICOR) 145 MG tablet Take 1 tablet (145 mg total) by mouth daily. 90 tablet  1   gabapentin (NEURONTIN) 100 MG capsule One four times daily 120 capsule 2   losartan (COZAAR) 100 MG tablet Take 1 tablet (100 mg total) by mouth daily. 90 tablet 1   methocarbamol (ROBAXIN) 750 MG tablet TAKE ONE TABLET BY MOUTH TWICE A DAY 180 tablet 0   pantoprazole (PROTONIX) 40 MG tablet Take 1 tablet (40 mg total) by mouth daily. Take 30-60 min before first meal of the day 30 tablet 2   predniSONE (DELTASONE) 10 MG tablet Take  4 each am x 2 days,   2 each am x 2 days,  1 each am x 2 days and stop 14 tablet 0   promethazine (PHENERGAN) 25 MG tablet TAKE ONE TABLET BY MOUTH EVERY SIX HOURS AS NEEDED FOR NAUSEA AND VOMITING 270 tablet 1   sertraline (ZOLOFT) 100 MG tablet TAKE TWO TABLETS BY MOUTH DAILY 180 tablet 0   SUMAtriptan (IMITREX) 100 MG tablet Take 1 tablet (100 mg total) by mouth daily as needed. 10 tablet 3   triamcinolone (KENALOG) 0.1 % Apply 1 application topically 2 (two) times daily. 30 g 0   triamcinolone cream (KENALOG) 0.1 % Apply 1 application topically once a week. 30 g 0   No current facility-administered medications for this visit.     Allergies (verified) Patient has no known allergies.   PAST HISTORY  Family History Family History  Problem Relation Age of Onset   Cancer Maternal Grandfather 74       stomaCH   Hypertension Mother     Social History Social History   Tobacco Use   Smoking status: Former    Types: Cigarettes    Quit date: 11/13/1974    Years since quitting: 46.1   Smokeless tobacco: Never  Substance Use Topics   Alcohol use: No    Are there smokers in your home (other than you)?  No  Risk Factors Current exercise habits: The patient does not participate in regular exercise at present.  Dietary issues discussed: none   Cardiac risk factors: none.  Depression Screen (Note: if answer to either of the following is "Yes", a more complete depression screening is indicated)   Q1: Over the past two weeks, have you felt down,  depressed or hopeless? No  Q2: Over the past two weeks, have you felt little interest or pleasure in doing things? No  Have you lost interest or pleasure in daily life? No  Do you often feel hopeless? No  Do you cry easily over simple problems? No  Activities of Daily Living In your present state of health, do you have any difficulty performing the following activities?:  Driving? No Managing money?  No Feeding yourself? No Getting from bed to chair? No Climbing a flight of stairs? No Preparing food and eating?: No Bathing or showering? No Getting dressed: No Getting to the toilet? No Using the toilet:No Moving around from place to place: No In the past year have you fallen  or had a near fall?:No   Are you sexually active?  Yes  Do you have more than one partner?  No  Hearing Difficulties: No Do you often ask people to speak up or repeat themselves? No Do you experience ringing or noises in your ears? No Do you have difficulty understanding soft or whispered voices? No   Do you feel that you have a problem with memory? No  Do you often misplace items? No  Do you feel safe at home?  Yes  Cognitive Testing  Alert? Yes  Normal Appearance?Yes  Oriented to person? Yes  Place? Yes   Time? Yes  Recall of three objects?  Yes  Can perform simple calculations? Yes  Displays appropriate judgment?Yes  Can read the correct time from a watch face?Yes   Advanced Directives have been discussed with the patient? Yes   List the Names of Other Physician/Practitioners you currently use: 1.    Indicate any recent Medical Services you may have received from other than [facility] providers in the past year (date may be approximate).  Immunization History  Administered Date(s) Administered   Influenza, High Dose Seasonal PF 12/03/2019   Influenza,inj,Quad PF,6+ Mos 12/11/2014   Influenza-Unspecified 12/19/2016, 11/30/2017, 12/27/2018   PFIZER(Purple Top)SARS-COV-2 Vaccination 06/20/2019,  07/15/2019, 01/02/2020   Pneumococcal Conjugate-13 05/26/2020   Tdap 11/11/2015, 05/30/2019   Zoster, Live 09/28/2015    Screening Tests Health Maintenance  Topic Date Due   Zoster Vaccines- Shingrix (1 of 2) Never done   COVID-19 Vaccine (4 - Booster for [place] series) 12/30/2020 (Originally 03/26/2020)   INFLUENZA VACCINE  07/01/2021 (Originally 11/01/2020)   COLONOSCOPY (Pts 45-31yrs Insurance coverage will need to be confirmed)  04/30/2024   TETANUS/TDAP  05/29/2029   Hepatitis C Screening  Completed   HPV VACCINES  Aged Out    All answers were reviewed with the patient and necessary referrals were made:  Jerelene Redden, [place]   12/28/2020   History reviewed: allergies, current medications, past family history, past medical history, past social history, past surgical history, and problem list  Review of Systems Defer to PCP    Objective:     Vision by Snellen chart: right FTD:DUKGUR to measure, left KYH:CWCBJS to measure There were no vitals taken for this visit. There is no height or weight on file to calculate BMI.       Assessment:           Plan:     During the course of the visit the patient was educated and counseled about appropriate screening and preventive services including:    Diet review for nutrition referral? Yes ____  Not Indicated ____   Patient Instructions (the written plan) was given to the patient.  Medicare Attestation I have personally reviewed: The patient's medical and social history Their use of alcohol, tobacco or illicit drugs Their current medications and supplements The patient's functional ability including ADLs,fall risks, home safety risks, cognitive, and hearing and visual impairment Diet and physical activities Evidence for depression or mood disorders  The patient's weight, height, BMI, and visual acuity have been recorded in the chart.  I have made referrals, counseling, and provided education to the patient based on  review of the above and I have provided the patient with a written personalized care plan for preventive services.    Non face to face 60 minutes  Jerelene Redden, [place]   12/28/2020    Mr. Lawal , Thank you for taking time to come for  your Medicare Wellness Visit. I appreciate your ongoing commitment to your health goals. Please review the following plan we discussed and let me know if I can assist you in the future.   These are the goals we discussed:  Goals   None     This is a list of the screening recommended for you and due dates:  Health Maintenance  Topic Date Due   Zoster (Shingles) Vaccine (1 of 2) Never done   COVID-19 Vaccine (4 - Booster for [employer] series) 12/30/2020*   Flu Shot  07/01/2021*   Colon Cancer Screening  04/30/2024   Tetanus Vaccine  05/29/2029   Hepatitis C Screening: USPSTF Recommendation to screen - Ages 18-79 yo.  Completed   HPV Vaccine  Aged Out  *Topic was postponed. The date shown is not the original due date.      Patient ID: Yeudiel Mateo, male   DOB: 1955-11-24, 65 y.o.   MRN: [id number]

## 2021-01-10 ENCOUNTER — Other Ambulatory Visit: Payer: Self-pay | Admitting: Internal Medicine

## 2021-01-27 ENCOUNTER — Ambulatory Visit: Payer: Medicare Other | Admitting: Internal Medicine

## 2021-01-27 ENCOUNTER — Other Ambulatory Visit: Payer: Self-pay

## 2021-01-27 ENCOUNTER — Encounter: Payer: Self-pay | Admitting: Internal Medicine

## 2021-01-27 DIAGNOSIS — R058 Other specified cough: Secondary | ICD-10-CM | POA: Diagnosis not present

## 2021-01-27 DIAGNOSIS — R059 Cough, unspecified: Secondary | ICD-10-CM | POA: Diagnosis not present

## 2021-01-27 MED ORDER — AZELASTINE-FLUTICASONE 137-50 MCG/ACT NA SUSP: 1.0000 | Freq: Two times a day (BID) | NASAL | 11 refills | Status: DC

## 2021-01-27 NOTE — Assessment & Plan Note (Signed)
Onset as teenager globus sensation comes and goes, worse with uri's - ENT eval neg 2008  - Allergy profile 12/14/2020 >  Eos 0.2 /  IgE  4 - 12/14/2020 trial of max gerd rx/ gabapentin titration as high as 100 mg qid -  01/27/2021 increase to 300 mg bid x one week, tid x one week then qid - Sinus CT  01/27/2021 >>>  Improved but still freq daytime throat clearing typical of Upper airway cough syndrome (previously labeled PNDS),  is so named because it's frequently impossible to sort out how much is  CR/sinusitis with freq throat clearing (which can be related to primary GERD)   vs  causing  secondary (" extra esophageal")  GERD from wide swings in gastric pressure that occur with throat clearing, often  promoting self use of mint and menthol lozenges that reduce the lower esophageal sphincter tone and exacerbate the problem further in a cyclical fashion.   These are the same pts (now being labeled as having "irritable larynx syndrome" by some cough centers) who not infrequently have a history of having failed to tolerate ace inhibitors,  dry powder inhalers or biphosphonates or report having atypical/extraesophageal reflux symptoms that don't respond to standard doses of PPI  and are easily confused as having aecopd or asthma flares by even experienced allergists/ pulmonologists (myself included).   Since rhinitis/ pnds preceded the refractory daily cough I believe he has multiple elements to the cough and needs the following approach  1) continue to gerd rx including using hard rock candy to suppress urge to clear the throat 2) try to eliminate rhinitis with dymista one bid / instructions given on how to spray it most effectively  3) titrate gabapentin to as high as 300 mg qid or lowest dose that controls cough  4) sinus CT and ent locally if abnormal, and if normal and no response to above next step is refer to Dr Bettina Gavia at [facility]    Discussed in detail all the  indications, usual  risks and  alternatives  relative to the benefits with patient who agrees to proceed with Rx as outlined.        Each maintenance medication was reviewed in detail including emphasizing most importantly the difference between maintenance and prns and under what circumstances the prns are to be triggered using an action plan format where appropriate.  Total time for H and P, chart review, counseling, reviewing nasal device(s) and generating customized AVS unique to this office visit / same day charting  > 30 min

## 2021-01-27 NOTE — Patient Instructions (Addendum)
Change gabapentin to  300 mg twice daily  x one week, three times a day  x one week then four times daily for a week -  stop at the lowest effective dose  Dymista one twice daily each nostril  (especially a bed time dose if you have tendency to earl am symptoms)  We will call to set up a sinus CT and notify you of the results   If not satisfied, call for ENT eval at [facility]  Dr Carol Ada - take you medications with you

## 2021-01-27 NOTE — Progress Notes (Signed)
Chris Daugherty, male    DOB: 15-Apr-1955,    MRN: [id number]   Brief patient profile:  56 yowm minimal smoking hx and tendency to nasal obstruction as early teen  assoc with "bronchitis"  ent eval neg around 2008  referred to pulmonary clinic in [facility]  12/14/2020 by Jon Billings NP  for post covid Jan 2022 sensation of sob and cough.       History of Present Illness  12/14/2020  Pulmonary/ 1st office eval/ Chris Daugherty / [employer]  Chief Complaint  Patient presents with   Consult    Covid in January and has had a cough since. Feels winded sometimes.    Dyspnea:  Not limited by breathing from desired activities  but feels tight  sensation with deep breathing at rest. Cough: not daily / sporadic, sensation of globus 75% improved overall but in retrospect has the same chronic sensation he's had even before covid of "something stuck in his throat"  Sleep: on side bed is flat, cough doesn't typically wake him or flare first thing in am  SABA use: saba can't tell helps Overt hb on prilosec 20 mg daily not ac   Rec Pantoprazole (protonix) 40 mg   Take  30-60 min before first meal of the day and Pepcid (famotidine)  20 mg after supper until return to office - this is the best way to tell whether stomach acid is contributing to your problem.   GERD diet reviewed, bed blocks rec  Prednisone 10 mg take  4 each am x 2 days,   2 each am x 2 days,  1 each am x 2 days and stop  For cough > delsym  2 tsp every 12 hours as needed  Gabapentin 100 mg one per day for 5 days, then 2 x 5 days, then 3 x 5 days then 4 x days  Please remember to go to the lab department   for your tests - we will call you with the results when they are available. Please schedule a follow up office visit in 6 weeks, call sooner if needed    01/27/2021  f/u ov/Kinga Cassar/ [facility] re: cough maint on gerd rx/ 100 mg four times daily   Chief Complaint  Patient presents with   Follow-up    Cough has improved but still  present--occ prod with clear sputum.   Dyspnea:  Not limited by breathing from desired activities  / yardwork ok  Cough: highly variable now/ still sense of globus  Sleeping: flat bed one pillow SABA use: none  02: not  Covid status:   vax x 4  Nose stuffy some mornings no worse since covid    No obvious day to day or daytime variability or assoc excess/ purulent sputum or mucus plugs or hemoptysis or cp or chest tightness, subjective wheeze or overt sinus or hb symptoms.   Sleeping  without nocturnal  or early am exacerbation  of respiratory  c/o's or need for noct saba. Also denies any obvious fluctuation of symptoms with weather or environmental changes or other aggravating or alleviating factors except as outlined above   No unusual exposure hx or h/o childhood pna/ asthma or knowledge of premature birth.  Current Allergies, Complete Past Medical History, Past Surgical History, Family History, and Social History were reviewed in [employer] record.  ROS  The following are not active complaints unless bolded Hoarseness, sore throat, dysphagia, dental problems, itching, sneezing,  nasal congestion or discharge  of excess mucus or purulent secretions, ear ache,   fever, chills, sweats, unintended wt loss or wt gain, classically pleuritic or exertional cp,  orthopnea pnd or arm/hand swelling  or leg swelling, presyncope, palpitations, abdominal pain, anorexia, nausea, vomiting, diarrhea  or change in bowel habits or change in bladder habits, change in stools or change in urine, dysuria, hematuria,  rash, arthralgias, visual complaints, headache, numbness, weakness or ataxia or problems with walking or coordination,  change in mood or  memory.        Current Meds  Medication Sig   amLODipine (NORVASC) 10 MG tablet Take 1 tablet (10 mg total) by mouth daily.   clotrimazole-betamethasone (LOTRISONE) cream Apply 1 application topically 2 (two) times daily.   famotidine  (PEPCID) 20 MG tablet One after supper   fenofibrate (TRICOR) 145 MG tablet Take 1 tablet (145 mg total) by mouth daily.   gabapentin (NEURONTIN) 100 MG capsule One four times daily   losartan (COZAAR) 100 MG tablet Take 1 tablet (100 mg total) by mouth daily.   methocarbamol (ROBAXIN) 750 MG tablet TAKE ONE TABLET BY MOUTH TWICE A DAY   pantoprazole (PROTONIX) 40 MG tablet TAKE ONE TABLET BY MOUTH DAILY. TAKE THIRTY-SIXTY MINUTES BEFORE FIRST MEAL OF THE DAY   promethazine (PHENERGAN) 25 MG tablet TAKE ONE TABLET BY MOUTH EVERY SIX HOURS AS NEEDED FOR NAUSEA AND VOMITING   sertraline (ZOLOFT) 100 MG tablet TAKE TWO TABLETS BY MOUTH DAILY   SUMAtriptan (IMITREX) 100 MG tablet Take 1 tablet (100 mg total) by mouth daily as needed.   triamcinolone (KENALOG) 0.1 % Apply 1 application topically 2 (two) times daily.   [DISCONTINUED] predniSONE (DELTASONE) 10 MG tablet Take  4 each am x 2 days,   2 each am x 2 days,  1 each am x 2 days and stop   [DISCONTINUED] triamcinolone cream (KENALOG) 0.1 % Apply 1 application topically once a week.                 Past Medical History:  Diagnosis Date   Acid reflux    Allergic rhinitis    Allergy    Anxiety    Depression    Hyperlipidemia    Hypertension    Testicular hypofunction         Objective:       Wt Readings from Last 3 Encounters:  01/27/21 199 lb (90.3 kg)  12/14/20 198 lb 12.8 oz (90.2 kg)  10/28/20 203 lb (92.1 kg)      Vital signs reviewed  01/27/2021  - Note at rest 02 sats  98% on RA   General appearance:    amb wm slt nasal tone to voice      HEENT : nl ear canals, nasal turbinates and orophx   NECK :  without JVD/Nodes/TM/ nl carotid upstrokes bilaterally   LUNGS: no acc muscle use,  Nl contour chest which is clear to A and P bilaterally without cough on insp or exp maneuvers   CV:  RRR  no s3 or murmur or increase in P2, and no edema   ABD:  soft and nontender with nl inspiratory excursion in the supine  position. No bruits or organomegaly appreciated, bowel sounds nl  MS:  Nl gait/ ext warm without deformities, calf tenderness, cyanosis or clubbing No obvious joint restrictions   SKIN: warm and dry without lesions    NEURO:  alert, approp, nl sensorium with  no motor or cerebellar deficits apparent.  Assessment

## 2021-02-07 ENCOUNTER — Other Ambulatory Visit: Payer: Self-pay | Admitting: Nurse Practitioner

## 2021-02-07 NOTE — Telephone Encounter (Signed)
Requested Prescriptions  Pending Prescriptions Disp Refills  . sertraline (ZOLOFT) 100 MG tablet [Pharmacy Med Name: SERTRALINE HCL 100 MG TABLET] 180 tablet 0    Sig: TAKE TWO TABLETS BY MOUTH DAILY     Psychiatry:  Antidepressants - SSRI Passed - 02/07/2021 11:03 AM      Passed - Valid encounter within last 6 months    Recent Outpatient Visits          3 months ago Post-COVID chronic cough   [place], NP   4 months ago [place], Lauren A, NP   7 months ago Viral upper respiratory tract infection   [facility] Jon Billings, NP   8 months ago [place], Karen, NP   10 months ago Hypertensive renal disease   [place] Rumball, Jake Church, DO      Future Appointments            In 1 month Jon Billings, NP [facility], [place]

## 2021-03-10 ENCOUNTER — Other Ambulatory Visit: Payer: Self-pay | Admitting: Family Medicine

## 2021-03-10 DIAGNOSIS — Z8669 Personal history of other diseases of the nervous system and sense organs: Secondary | ICD-10-CM

## 2021-03-10 NOTE — Telephone Encounter (Signed)
Requested medication (s) are due for refill today: yes  Requested medication (s) are on the active medication list: yes  Last refill:  05/28/19  Future visit scheduled: 03/29/21  Notes to clinic:  This medication can not be delegated, please assess.    Requested Prescriptions  Pending Prescriptions Disp Refills   methocarbamol (ROBAXIN) 750 MG tablet [Pharmacy Med Name: METHOCARBAMOL 750 MG TABLET] 180 tablet 0    Sig: TAKE ONE TABLET BY MOUTH TWICE A DAY     Not Delegated - Analgesics:  Muscle Relaxants Failed - 03/10/2021  3:07 PM      Failed - This refill cannot be delegated      Passed - Valid encounter within last 6 months    Recent Outpatient Visits           4 months ago Post-COVID chronic cough   [place], NP   5 months ago [place], Lauren A, NP   8 months ago Viral upper respiratory tract infection   [facility] Jon Billings, NP   9 months ago [place], Karen, NP   11 months ago Hypertensive renal disease   [place] Rumball, Jake Church, DO       Future Appointments             In 2 weeks Jon Billings, NP [place], [facility]             promethazine (PHENERGAN) 25 MG tablet [Pharmacy Med Name: PROMETHAZINE 25 MG TABLET] 270 tablet 1    Sig: TAKE ONE TABLET BY MOUTH EVERY SIX HOURS AS NEEDED FOR NAUSEA AND VOMITING     Not Delegated - Gastroenterology: Antiemetics Failed - 03/10/2021  3:07 PM      Failed - This refill cannot be delegated      Passed - Valid encounter within last 6 months    Recent Outpatient Visits           4 months ago Post-COVID chronic cough   [facility] Jon Billings, NP   5 months ago [place], Lauren A, NP   8 months ago Viral upper respiratory tract infection   [facility] Jon Billings, NP   9 months ago [place], Karen, NP   11 months ago Hypertensive renal disease   [place] Rumball, Jake Church, DO       Future Appointments             In 2 weeks Jon Billings, NP [facility], [facility]            Signed Prescriptions Disp Refills   SUMAtriptan (IMITREX) 100 MG tablet 10 tablet 3    Sig: TAKE ONE TABLET BY MOUTH DAILY AS NEEDED     Neurology:  Migraine Therapy - Triptan Passed - 03/10/2021  3:07 PM      Passed - Last BP in normal range    BP Readings from Last 1 Encounters:  01/27/21 124/76          Passed - Valid encounter within last 12 months    Recent Outpatient Visits           4 months ago Post-COVID chronic cough   [place], NP   5 months ago [place], Scheryl Darter, NP   8  months ago Viral upper respiratory tract infection   [facility] Jon Billings, NP   9 months ago [place], NP   11 months ago Hypertensive renal disease   [place] Rumball, Jake Church, DO       Future Appointments             In 2 weeks Jon Billings, NP [facility], [place]

## 2021-03-10 NOTE — Telephone Encounter (Signed)
Requested Prescriptions  Pending Prescriptions Disp Refills  . SUMAtriptan (IMITREX) 100 MG tablet [Pharmacy Med Name: SUMAtriptan SUCC 100 MG TABLET] 10 tablet 3    Sig: TAKE ONE TABLET BY MOUTH DAILY AS NEEDED     Neurology:  Migraine Therapy - Triptan Passed - 03/10/2021  3:07 PM      Passed - Last BP in normal range    BP Readings from Last 1 Encounters:  01/27/21 124/76         Passed - Valid encounter within last 12 months    Recent Outpatient Visits          4 months ago Post-COVID chronic cough   [place], NP   5 months ago [place], Lauren A, NP   8 months ago Viral upper respiratory tract infection   [facility] Jon Billings, NP   9 months ago [place], Karen, NP   11 months ago Hypertensive renal disease   [place] Rumball, Jake Church, DO      Future Appointments            In 2 weeks Jon Billings, NP [facility], [facility]           . methocarbamol (ROBAXIN) 750 MG tablet [Pharmacy Med Name: METHOCARBAMOL 750 MG TABLET] 180 tablet 0    Sig: TAKE ONE TABLET BY MOUTH TWICE A DAY     Not Delegated - Analgesics:  Muscle Relaxants Failed - 03/10/2021  3:07 PM      Failed - This refill cannot be delegated      Passed - Valid encounter within last 6 months    Recent Outpatient Visits          4 months ago Post-COVID chronic cough   [place], NP   5 months ago [place], Lauren A, NP   8 months ago Viral upper respiratory tract infection   [facility] Jon Billings, NP   9 months ago [place], Karen, NP   11 months ago Hypertensive renal disease   [place] Rumball, Jake Church, DO      Future Appointments            In 2 weeks Jon Billings, NP [facility] [place]            . promethazine (PHENERGAN) 25 MG tablet [Pharmacy Med Name: PROMETHAZINE 25 MG TABLET] 270 tablet 1    Sig: TAKE ONE TABLET BY MOUTH EVERY SIX HOURS AS NEEDED FOR NAUSEA AND VOMITING     Not Delegated - Gastroenterology: Antiemetics Failed - 03/10/2021  3:07 PM      Failed - This refill cannot be delegated      Passed - Valid encounter within last 6 months    Recent Outpatient Visits          4 months ago Post-COVID chronic cough   [place], NP   5 months ago [place], Lauren A, NP   8 months ago Viral upper respiratory tract infection   [place], NP   9 months ago [place], Karen, NP   11 months ago Hypertensive renal disease   [place] Rumball, Jake Church, DO      Future Appointments  In 2 weeks Jon Billings, NP [facility], [facility]

## 2021-03-16 ENCOUNTER — Other Ambulatory Visit: Payer: Self-pay | Admitting: Internal Medicine

## 2021-03-18 ENCOUNTER — Other Ambulatory Visit: Payer: Self-pay | Admitting: Nurse Practitioner

## 2021-03-18 DIAGNOSIS — I10 Essential (primary) hypertension: Secondary | ICD-10-CM

## 2021-03-18 DIAGNOSIS — E785 Hyperlipidemia, unspecified: Secondary | ICD-10-CM

## 2021-03-18 NOTE — Telephone Encounter (Signed)
Requested Prescriptions  Pending Prescriptions Disp Refills   losartan (COZAAR) 100 MG tablet [Pharmacy Med Name: LOSARTAN POTASSIUM 100 MG TAB] 90 tablet 0    Sig: TAKE ONE TABLET BY MOUTH DAILY     Cardiovascular:  Angiotensin Receptor Blockers Failed - 03/18/2021  6:21 AM      Failed - Cr in normal range and within 180 days    Creatinine, Ser  Date Value Ref Range Status  09/23/2020 1.49 (H) 0.76 - 1.27 mg/dL Final         Passed - K in normal range and within 180 days    Potassium  Date Value Ref Range Status  09/23/2020 3.8 3.5 - 5.2 mmol/L Final         Passed - Patient is not pregnant      Passed - Last BP in normal range    BP Readings from Last 1 Encounters:  01/27/21 124/76         Passed - Valid encounter within last 6 months    Recent Outpatient Visits          4 months ago Post-COVID chronic cough   [facility] Jon Billings, NP   5 months ago [place], Lauren A, NP   8 months ago Viral upper respiratory tract infection   [facility] Jon Billings, NP   9 months ago [place], Karen, NP   11 months ago Hypertensive renal disease   [place] Rumball, Jake Church, DO      Future Appointments            In 1 week Jon Billings, NP [facility], [facility]            amLODipine ([place]olls) 10 MG tablet [Pharmacy Med Name: amLODIPine BESYLATE 10 MG TAB] 90 tablet 0    Sig: TAKE ONE TABLET BY MOUTH DAILY     Cardiovascular:  Calcium Channel Blockers Passed - 03/18/2021  6:21 AM      Passed - Last BP in normal range    BP Readings from Last 1 Encounters:  01/27/21 124/76         Passed - Valid encounter within last 6 months    Recent Outpatient Visits          4 months ago Post-COVID chronic cough   [facility] Jon Billings, NP   5 months ago [place], Lauren A, NP   8  months ago Viral upper respiratory tract infection   [facility] Jon Billings, NP   9 months ago [place], Karen, NP   11 months ago Hypertensive renal disease   [place] Myles Gip, DO      Future Appointments            In 1 week Jon Billings, NP [place], [facility]            fenofibrate (TRICOR) 145 MG tablet [Pharmacy Med Name: FENOFIBRATE 145 MG TABLET] 90 tablet 1    Sig: TAKE ONE TABLET BY MOUTH DAILY     Cardiovascular:  Antilipid - Fibric Acid Derivatives Failed - 03/18/2021  6:21 AM      Failed - LDL in normal range and within 360 days    LDL Chol Calc (NIH)  Date Value Ref Range Status  09/23/2020 105 (H) 0 - 99 mg/dL Final  Failed - HDL in normal range and within 360 days    HDL  Date Value Ref Range Status  09/23/2020 39 (L) >39 mg/dL Final         Failed - Triglycerides in normal range and within 360 days    Triglycerides  Date Value Ref Range Status  09/23/2020 156 (H) 0 - 149 mg/dL Final         Failed - Cr in normal range and within 180 days    Creatinine, Ser  Date Value Ref Range Status  09/23/2020 1.49 (H) 0.76 - 1.27 mg/dL Final         Failed - eGFR in normal range and within 180 days    GFR calc Af Amer  Date Value Ref Range Status  03/31/2020 66 >59 mL/min/1.73 Final    Comment:    **In accordance with recommendations from the NKF-ASN Task force,**   [facility] is in the process of updating its eGFR calculation to the   2021 CKD-EPI creatinine equation that estimates kidney function   without a race variable.    GFR calc non Af Amer  Date Value Ref Range Status  03/31/2020 57 (L) >59 mL/min/1.73 Final   eGFR  Date Value Ref Range Status  09/23/2020 52 (L) >59 mL/min/1.73 Final         Passed - Total Cholesterol in normal range and within 360 days    Cholesterol, Total  Date Value Ref Range Status  09/23/2020 172 100 - 199 mg/dL  Final         Passed - ALT in normal range and within 180 days    ALT  Date Value Ref Range Status  09/23/2020 16 0 - 44 IU/L Final         Passed - AST in normal range and within 180 days    AST  Date Value Ref Range Status  09/23/2020 21 0 - 40 IU/L Final         Passed - Valid encounter within last 12 months    Recent Outpatient Visits          4 months ago Post-COVID chronic cough   [place], NP   5 months ago [place], Lauren A, NP   8 months ago Viral upper respiratory tract infection   [facility] Jon Billings, NP   9 months ago [place], Karen, NP   11 months ago Hypertensive renal disease   [place] Rum[place], Jake Church, DO      Future Appointments            In 1 week Jon Billings, NP [facility], [place]

## 2021-03-29 ENCOUNTER — Encounter: Payer: Self-pay | Admitting: Nurse Practitioner

## 2021-03-29 ENCOUNTER — Other Ambulatory Visit: Payer: Self-pay

## 2021-03-29 ENCOUNTER — Ambulatory Visit (INDEPENDENT_AMBULATORY_CARE_PROVIDER_SITE_OTHER): Payer: Medicare Other | Admitting: Nurse Practitioner

## 2021-03-29 VITALS — BP 128/77 | HR 74 | Temp 97.8°F | Ht 72.0 in | Wt 211.8 lb

## 2021-03-29 DIAGNOSIS — N289 Disorder of kidney and ureter, unspecified: Secondary | ICD-10-CM

## 2021-03-29 DIAGNOSIS — E785 Hyperlipidemia, unspecified: Secondary | ICD-10-CM

## 2021-03-29 DIAGNOSIS — N1832 Chronic kidney disease, stage 3b: Secondary | ICD-10-CM | POA: Insufficient documentation

## 2021-03-29 DIAGNOSIS — F419 Anxiety disorder, unspecified: Secondary | ICD-10-CM

## 2021-03-29 DIAGNOSIS — F5101 Primary insomnia: Secondary | ICD-10-CM | POA: Diagnosis not present

## 2021-03-29 DIAGNOSIS — I10 Essential (primary) hypertension: Secondary | ICD-10-CM | POA: Diagnosis not present

## 2021-03-29 MED ORDER — AMLODIPINE BESYLATE 10 MG PO TABS: 10.0000 mg | ORAL_TABLET | Freq: Every day | ORAL | 1 refills | Status: DC

## 2021-03-29 MED ORDER — TRAZODONE HCL 50 MG PO TABS: 25.0000 mg | ORAL_TABLET | Freq: Every evening | ORAL | 1 refills | Status: DC | PRN

## 2021-03-29 MED ORDER — LOSARTAN POTASSIUM 100 MG PO TABS
100.0000 mg | ORAL_TABLET | Freq: Every day | ORAL | 1 refills | Status: DC
Start: 2021-03-29 — End: 2021-10-25

## 2021-03-29 MED ORDER — SERTRALINE HCL 100 MG PO TABS: 200.0000 mg | ORAL_TABLET | Freq: Every day | ORAL | 1 refills | Status: DC

## 2021-03-29 NOTE — Assessment & Plan Note (Signed)
Labs ordered today.  Will make recommendations based on lab results. ?

## 2021-03-29 NOTE — Assessment & Plan Note (Signed)
Chronic.  Controlled.  Continue with current medication regimen of Amlodipine 10mg  daily.  Labs ordered today.  Return to clinic in 6 months for reevaluation.  Call sooner if concerns arise.

## 2021-03-29 NOTE — Assessment & Plan Note (Signed)
Chronic.  Controlled.  Continue with current medication regimen on Zoloft 100mg daily.  Labs ordered today.  Return to clinic in 6 months for reevaluation.  Call sooner if concerns arise.   

## 2021-03-29 NOTE — Assessment & Plan Note (Signed)
Having difficult sleeping. Will try Trazodone 50mg  daily. Discussed how to properly take the medication. Discussed side effects and benefits of medication with patient during visit. Follow up in 6 months. Call sooner if concerns arise.

## 2021-03-29 NOTE — Progress Notes (Addendum)
BP 128/77    Pulse 74    Temp 97.8 F (36.6 C) (Oral)    Ht 6' (1.829 m)    Wt 211 lb 12.8 oz (96.1 kg)    SpO2 98%    BMI 28.73 kg/m    Subjective:    Patient ID: Chris Daugherty, male    DOB: 1955/10/18, 65 y.o.   MRN: [id number]  HPI: Chris Daugherty is a 65 y.o. male  Chief Complaint  Patient presents with   Depression   Hyperlipidemia   Hypertension   HYPERTENSION / HYPERLIPIDEMIA Satisfied with current treatment? no Duration of hypertension: years BP monitoring frequency: not checking BP range:  BP medication side effects: no Past BP meds: amlodipine Duration of hyperlipidemia: years Cholesterol medication side effects: no Cholesterol supplements: none Past cholesterol medications: fenofibrate (tricor) Medication compliance: excellent compliance Aspirin: no Recent stressors: no Recurrent headaches: no Visual changes: no Palpitations: no Dyspnea: no Chest pain: no Lower extremity edema: no Dizzy/lightheaded: no  ANXIETY Patient states his anxiety is much better.  The Zoloft is helping a lot.  Denies SI. He is waking up several times at night.  Would like to try something for sleep.    [place] Office Visit from 03/29/2021 in [place]  PHQ-9 Total Score 1       GAD 7 : Generalized Anxiety Score 03/18/2019 09/12/2018 02/27/2018  Nervous, Anxious, on Edge 0 0 0  Control/stop worrying 0 0 0  Worry too much - different things 0 0 0  Trouble relaxing 0 0 0  Restless 0 0 0  Easily annoyed or irritable 0 0 0  Afraid - awful might happen 0 0 0  Total GAD 7 Score 0 0 0  Anxiety Difficulty - Not difficult at all -      Relevant past medical, surgical, family and social history reviewed and updated as indicated. Interim medical history since our last visit reviewed. Allergies and medications reviewed and updated.  Review of Systems  Eyes:  Negative for visual disturbance.  Respiratory:  Negative for chest tightness and shortness of  breath.   Cardiovascular:  Negative for chest pain, palpitations and leg swelling.  Neurological:  Negative for dizziness, light-headedness and headaches.  Psychiatric/Behavioral:  Positive for sleep disturbance. Negative for dysphoric mood and suicidal ideas. The patient is not nervous/anxious.    Per HPI unless specifically indicated above     Objective:    BP 128/77    Pulse 74    Temp 97.8 F (36.6 C) (Oral)    Ht 6' (1.829 m)    Wt 211 lb 12.8 oz (96.1 kg)    SpO2 98%    BMI 28.73 kg/m   Wt Readings from Last 3 Encounters:  03/29/21 211 lb 12.8 oz (96.1 kg)  01/27/21 199 lb (90.3 kg)  12/14/20 198 lb 12.8 oz (90.2 kg)    Physical Exam Vitals and nursing note reviewed.  Constitutional:      General: He is not in acute distress.    Appearance: Normal appearance. He is not ill-appearing, toxic-appearing or diaphoretic.  HENT:     Head: Normocephalic.     Right Ear: External ear normal.     Left Ear: External ear normal.     Nose: Nose normal. No congestion or rhinorrhea.     Mouth/Throat:     Mouth: Mucous membranes are moist.  Eyes:     General:        Right eye: No discharge.  Left eye: No discharge.     Extraocular Movements: Extraocular movements intact.     Conjunctiva/sclera: Conjunctivae normal.     Pupils: Pupils are equal, round, and reactive to light.  Cardiovascular:     Rate and Rhythm: Normal rate and regular rhythm.     Heart sounds: No murmur heard. Pulmonary:     Effort: Pulmonary effort is normal. No respiratory distress.     Breath sounds: Normal breath sounds. No wheezing, rhonchi or rales.  Abdominal:     General: Abdomen is flat. Bowel sounds are normal.  Musculoskeletal:     Cervical back: Normal range of motion and neck supple.  Skin:    General: Skin is warm and dry.     Capillary Refill: Capillary refill takes less than 2 seconds.  Neurological:     General: No focal deficit present.     Mental Status: He is alert and oriented to  person, place, and time.  Psychiatric:        Mood and Affect: Mood normal.        Behavior: Behavior normal.        Thought Content: Thought content normal.        Judgment: Judgment normal.    Results for orders placed or performed during the hospital encounter of 12/14/20  IgE  Result Value Ref Range   IgE (Immunoglobulin E), Serum 4 (L) 6 - 495 IU/mL  CBC with Differential/Platelet  Result Value Ref Range   WBC 4.6 4.0 - 10.5 K/uL   RBC 4.73 4.22 - 5.81 MIL/uL   Hemoglobin 13.1 13.0 - 17.0 g/dL   HCT 38.6 (L) 39.0 - 52.0 %   MCV 81.6 80.0 - 100.0 fL   MCH 27.7 26.0 - 34.0 pg   MCHC 33.9 30.0 - 36.0 g/dL   RDW 13.7 11.5 - 15.5 %   Platelets 159 150 - 400 K/uL   nRBC 0.0 0.0 - 0.2 %   Neutrophils Relative % 67 %   Neutro Abs 3.0 1.7 - 7.7 K/uL   Lymphocytes Relative 20 %   Lymphs Abs 0.9 0.7 - 4.0 K/uL   Monocytes Relative 8 %   Monocytes Absolute 0.4 0.1 - 1.0 K/uL   Eosinophils Relative 4 %   Eosinophils Absolute 0.2 0.0 - 0.5 K/uL   Basophils Relative 1 %   Basophils Absolute 0.1 0.0 - 0.1 K/uL   Immature Granulocytes 0 %   Abs Immature Granulocytes 0.01 0.00 - 0.07 K/uL      Assessment & Plan:   Problem List Items Addressed This Visit       Cardiovascular and Mediastinum   Essential (primary) hypertension    Chronic.  Controlled.  Continue with current medication regimen of Amlodipine 39m daily.  Labs ordered today.  Return to clinic in 6 months for reevaluation.  Call sooner if concerns arise.        Relevant Medications   amLODipine (NORVASC) 10 MG tablet   losartan (COZAAR) 100 MG tablet   Other Relevant Orders   Comp Met (CMET)   Lipid Profile     Genitourinary   Renal insufficiency    Labs ordered today. Will make recommendations based on lab results.      Stage 3b chronic kidney disease ([facility] - Primary    Labs ordered today. Will make recommendations based on lab results.        Other   Anxiety    Chronic.  Controlled.  Continue with  current medication regimen  on Zoloft 1104m daily.  Labs ordered today.  Return to clinic in 6 months for reevaluation.  Call sooner if concerns arise.        Relevant Medications   traZODone (DESYREL) 50 MG tablet   sertraline (ZOLOFT) 100 MG tablet   Insomnia    Having difficult sleeping. Will try Trazodone 546mdaily. Discussed how to properly take the medication. Discussed side effects and benefits of medication with patient during visit. Follow up in 6 months. Call sooner if concerns arise.      Hyperlipidemia    Labs ordered today. Will make recommendations based on lab results.       Relevant Medications   amLODipine (NORVASC) 10 MG tablet   losartan (COZAAR) 100 MG tablet   Other Relevant Orders   Lipid Profile     Follow up plan: Return in about 6 months (around 09/27/2021) for Physical and Fasting labs.

## 2021-03-30 LAB — COMPREHENSIVE METABOLIC PANEL
ALT: 15 IU/L (ref 0–44)
AST: 20 IU/L (ref 0–40)
Albumin/Globulin Ratio: 2.8 — ABNORMAL HIGH (ref 1.2–2.2)
Albumin: 4.7 g/dL (ref 3.8–4.8)
Alkaline Phosphatase: 54 IU/L (ref 44–121)
BUN/Creatinine Ratio: 18 (ref 10–24)
BUN: 26 mg/dL (ref 8–27)
Bilirubin Total: 0.3 mg/dL (ref 0.0–1.2)
CO2: 24 mmol/L (ref 20–29)
Calcium: 9.1 mg/dL (ref 8.6–10.2)
Chloride: 105 mmol/L (ref 96–106)
Creatinine, Ser: 1.43 mg/dL — ABNORMAL HIGH (ref 0.76–1.27)
Globulin, Total: 1.7 g/dL (ref 1.5–4.5)
Glucose: 126 mg/dL — ABNORMAL HIGH (ref 70–99)
Potassium: 4 mmol/L (ref 3.5–5.2)
Sodium: 144 mmol/L (ref 134–144)
Total Protein: 6.4 g/dL (ref 6.0–8.5)
eGFR: 54 mL/min/{1.73_m2} — ABNORMAL LOW (ref >59)

## 2021-03-30 LAB — LIPID PANEL
Chol/HDL Ratio: 5.2 ratio — ABNORMAL HIGH (ref 0.0–5.0)
Cholesterol, Total: 183 mg/dL (ref 100–199)
HDL: 35 mg/dL — ABNORMAL LOW (ref >39)
LDL Chol Calc (NIH): 118 mg/dL — ABNORMAL HIGH (ref 0–99)
Triglycerides: 167 mg/dL — ABNORMAL HIGH (ref 0–149)
VLDL Cholesterol Cal: 30 mg/dL (ref 5–40)

## 2021-03-30 NOTE — Progress Notes (Signed)
Hi Mr. Stangelo.  Your lab work looks good.  Kidney function remains controlled. Your cholesterol is still elevated.  I recommend following a low fat diet and exercised.  Please let me know if you have any questions.

## 2021-04-06 ENCOUNTER — Encounter: Payer: Self-pay | Admitting: Nurse Practitioner

## 2021-05-05 ENCOUNTER — Other Ambulatory Visit: Payer: Self-pay | Admitting: Internal Medicine

## 2021-05-12 DIAGNOSIS — S99922A Unspecified injury of left foot, initial encounter: Secondary | ICD-10-CM | POA: Diagnosis not present

## 2021-05-12 DIAGNOSIS — S92415A Nondisplaced fracture of proximal phalanx of left great toe, initial encounter for closed fracture: Secondary | ICD-10-CM | POA: Diagnosis not present

## 2021-05-12 DIAGNOSIS — Z8739 Personal history of other diseases of the musculoskeletal system and connective tissue: Secondary | ICD-10-CM | POA: Diagnosis not present

## 2021-05-24 ENCOUNTER — Other Ambulatory Visit: Payer: Self-pay

## 2021-05-24 ENCOUNTER — Ambulatory Visit (INDEPENDENT_AMBULATORY_CARE_PROVIDER_SITE_OTHER): Payer: Medicare Other | Admitting: Unknown Physician Specialty

## 2021-05-24 ENCOUNTER — Encounter: Payer: Self-pay | Admitting: Unknown Physician Specialty

## 2021-05-24 DIAGNOSIS — L409 Psoriasis, unspecified: Secondary | ICD-10-CM | POA: Diagnosis not present

## 2021-05-24 MED ORDER — METHYLPREDNISOLONE 4 MG PO TBPK: ORAL_TABLET | ORAL | 0 refills | Status: DC

## 2021-05-24 MED ORDER — CLOTRIMAZOLE-BETAMETHASONE 1-0.05 % EX CREA: 1.0000 "application " | TOPICAL_CREAM | Freq: Every day | CUTANEOUS | 0 refills | Status: DC

## 2021-05-24 NOTE — Progress Notes (Signed)
BP (!) 114/56    Pulse 73    Temp 98.3 F (36.8 C) (Oral)    Wt 205 lb 9.6 oz (93.3 kg)    SpO2 97%    BMI 27.88 kg/m    Subjective:    Patient ID: Chris Daugherty, male    DOB: 1955/10/06, 66 y.o.   MRN: [id number]  HPI: Khaleel Beckom is a 66 y.o. male  Chief Complaint  Patient presents with   Rash    Pt states he has a rash on his hands and forearms that has been coming and going for the last month. States the areas do burn and itch    Pt is here for severe cracking and dryness bilateral fingers.  This is ongoing for years but this is the worst.  Make appointment with dermatologist but unable to get up an appointment with [facility] soon.  Pt has several creams such as Kenalog and Lotrisone without any benefit.    Relevant past medical, surgical, family and social history reviewed and updated as indicated. Interim medical history since our last visit reviewed. Allergies and medications reviewed and updated.  Review of Systems  Per HPI unless specifically indicated above     Objective:    BP (!) 114/56    Pulse 73    Temp 98.3 F (36.8 C) (Oral)    Wt 205 lb 9.6 oz (93.3 kg)    SpO2 97%    BMI 27.88 kg/m   Wt Readings from Last 3 Encounters:  05/24/21 205 lb 9.6 oz (93.3 kg)  03/29/21 211 lb 12.8 oz (96.1 kg)  01/27/21 199 lb (90.3 kg)    Physical Exam Constitutional:      General: He is not in acute distress.    Appearance: Normal appearance. He is well-developed.  HENT:     Head: Normocephalic and atraumatic.  Eyes:     General: Lids are normal. No scleral icterus.       Right eye: No discharge.        Left eye: No discharge.     Conjunctiva/sclera: Conjunctivae normal.  Cardiovascular:     Rate and Rhythm: Normal rate.  Pulmonary:     Effort: Pulmonary effort is normal.  Abdominal:     Palpations: There is no hepatomegaly or splenomegaly.  Musculoskeletal:        General: Normal range of motion.  Skin:    Coloration: Skin is not pale.      Findings: No rash.     Comments: Extensive drying, fingernail pitting, cracking bilateral hands and particularly fingertips.    Neurological:     Mental Status: He is alert and oriented to person, place, and time.  Psychiatric:        Behavior: Behavior normal.        Thought Content: Thought content normal.        Judgment: Judgment normal.    Results for orders placed or performed in visit on 03/29/21  Comp Met (CMET)  Result Value Ref Range   Glucose 126 (H) 70 - 99 mg/dL   BUN 26 8 - 27 mg/dL   Creatinine, Ser 1.43 (H) 0.76 - 1.27 mg/dL   eGFR 54 (L) >59 mL/min/1.73   BUN/Creatinine Ratio 18 10 - 24   Sodium 144 134 - 144 mmol/L   Potassium 4.0 3.5 - 5.2 mmol/L   Chloride 105 96 - 106 mmol/L   CO2 24 20 - 29 mmol/L   Calcium 9.1 8.6 - 10.2 mg/dL  Total Protein 6.4 6.0 - 8.5 g/dL   Albumin 4.7 3.8 - 4.8 g/dL   Globulin, Total 1.7 1.5 - 4.5 g/dL   Albumin/Globulin Ratio 2.8 (H) 1.2 - 2.2   Bilirubin Total 0.3 0.0 - 1.2 mg/dL   Alkaline Phosphatase 54 44 - 121 IU/L   AST 20 0 - 40 IU/L   ALT 15 0 - 44 IU/L  Lipid Profile  Result Value Ref Range   Cholesterol, Total 183 100 - 199 mg/dL   Triglycerides 167 (H) 0 - 149 mg/dL   HDL 35 (L) >39 mg/dL   VLDL Cholesterol Cal 30 5 - 40 mg/dL   LDL Chol Calc (NIH) 118 (H) 0 - 99 mg/dL   Chol/HDL Ratio 5.2 (H) 0.0 - 5.0 ratio      Assessment & Plan:   Problem List Items Addressed This Visit       Unprioritized   Psoriasis    Vs Eczema.  Lean towards psoriasis with nail symptoms.  Will rx steroid taper and refill Lotrisone.  Pt ed on emollients with cotton gloves at night.          Follow up plan: Return if symptoms worsen or fail to improve.

## 2021-05-24 NOTE — Assessment & Plan Note (Signed)
Vs Eczema.  Lean towards psoriasis with nail symptoms.  Will rx steroid taper and refill Lotrisone.  Pt ed on emollients with cotton gloves at night.

## 2021-05-26 ENCOUNTER — Ambulatory Visit: Payer: Medicare Other | Admitting: Nurse Practitioner

## 2021-08-03 ENCOUNTER — Other Ambulatory Visit: Payer: Self-pay | Admitting: Internal Medicine

## 2021-08-15 NOTE — Progress Notes (Signed)
? ?BP 122/79   Pulse 83   Temp 98.4 ?F (36.9 ?C) (Oral)   Wt 203 lb 9.6 oz (92.4 kg)   SpO2 98%   BMI 27.61 kg/m?   ? ?Subjective:  ? ? Patient ID: Chris Daugherty, male    DOB: [birth year], 66 y.o.   MRN: [id number] ? ?HPI: ?Chris Daugherty is a 66 y.o. male ? ?Chief Complaint  ?Patient presents with  ? Psoriasis  ?  States he is doing better, but he is having a new flare up. New flare up ongoing x 1 week. Peeling skin on finger tips of bilateral hands  ? ? ?Patient states it has gotten better.  But a new flare started up last week.  States it itches and burns.  Denies joint pains, no fatigue. Patient states he has an appointment with Dermatology in about 4 weeks.   ? ?Relevant past medical, surgical, family and social history reviewed and updated as indicated. Interim medical history since our last visit reviewed. ?Allergies and medications reviewed and updated. ? ?Review of Systems  ?Skin:  Positive for rash.  ? ?Per HPI unless specifically indicated above ? ?   ?Objective:  ?  ?BP 122/79   Pulse 83   Temp 98.4 ?F (36.9 ?C) (Oral)   Wt 203 lb 9.6 oz (92.4 kg)   SpO2 98%   BMI 27.61 kg/m?   ?Wt Readings from Last 3 Encounters:  ?08/16/21 203 lb 9.6 oz (92.4 kg)  ?05/24/21 205 lb 9.6 oz (93.3 kg)  ?03/29/21 211 lb 12.8 oz (96.1 kg)  ?  ?Physical Exam ?Vitals and nursing note reviewed.  ?Constitutional:   ?   General: He is not in acute distress. ?   Appearance: Normal appearance. He is not ill-appearing, toxic-appearing or diaphoretic.  ?HENT:  ?   Head: Normocephalic.  ?   Right Ear: External ear normal.  ?   Left Ear: External ear normal.  ?   Nose: Nose normal. No congestion or rhinorrhea.  ?   Mouth/Throat:  ?   Mouth: Mucous membranes are moist.  ?Eyes:  ?   General:     ?   Right eye: No discharge.     ?   Left eye: No discharge.  ?   Extraocular Movements: Extraocular movements intact.  ?   Conjunctiva/sclera: Conjunctivae normal.  ?   Pupils: Pupils are equal, round, and reactive to light.   ?Cardiovascular:  ?   Rate and Rhythm: Normal rate and regular rhythm.  ?   Heart sounds: No murmur heard. ?Pulmonary:  ?   Effort: Pulmonary effort is normal. No respiratory distress.  ?   Breath sounds: Normal breath sounds. No wheezing, rhonchi or rales.  ?Abdominal:  ?   General: Abdomen is flat. Bowel sounds are normal.  ?Musculoskeletal:  ?   Cervical back: Normal range of motion and neck supple.  ?Skin: ?   General: Skin is warm and dry.  ?   Capillary Refill: Capillary refill takes less than 2 seconds.  ?   Findings: Rash present. Rash is crusting.  ? ?    ?Neurological:  ?   General: No focal deficit present.  ?   Mental Status: He is alert and oriented to person, place, and time.  ?Psychiatric:     ?   Mood and Affect: Mood normal.     ?   Behavior: Behavior normal.     ?   Thought Content: Thought content normal.     ?  Judgment: Judgment normal.  ? ? ?Results for orders placed or performed in visit on 03/29/21  ?Comp Met (CMET)  ?Result Value Ref Range  ? Glucose 126 (H) 70 - 99 mg/dL  ? BUN 26 8 - 27 mg/dL  ? Creatinine, Ser 1.43 (H) 0.76 - 1.27 mg/dL  ? eGFR 54 (L) >59 mL/min/1.73  ? BUN/Creatinine Ratio 18 10 - 24  ? Sodium 144 134 - 144 mmol/L  ? Potassium 4.0 3.5 - 5.2 mmol/L  ? Chloride 105 96 - 106 mmol/L  ? CO2 24 20 - 29 mmol/L  ? Calcium 9.1 8.6 - 10.2 mg/dL  ? Total Protein 6.4 6.0 - 8.5 g/dL  ? Albumin 4.7 3.8 - 4.8 g/dL  ? Globulin, Total 1.7 1.5 - 4.5 g/dL  ? Albumin/Globulin Ratio 2.8 (H) 1.2 - 2.2  ? Bilirubin Total 0.3 0.0 - 1.2 mg/dL  ? Alkaline Phosphatase 54 44 - 121 IU/L  ? AST 20 0 - 40 IU/L  ? ALT 15 0 - 44 IU/L  ?Lipid Profile  ?Result Value Ref Range  ? Cholesterol, Total 183 100 - 199 mg/dL  ? Triglycerides 167 (H) 0 - 149 mg/dL  ? HDL 35 (L) >39 mg/dL  ? VLDL Cholesterol Cal 30 5 - 40 mg/dL  ? LDL Chol Calc (NIH) 118 (H) 0 - 99 mg/dL  ? Chol/HDL Ratio 5.2 (H) 0.0 - 5.0 ratio  ? ?   ?Assessment & Plan:  ? ?Problem List Items Addressed This Visit   ? ?  ? Musculoskeletal and  Integument  ? Psoriasis - Primary  ?  New flare.  Had resolved after steroid treatment and cream.  Keep appointment with Dermatology.  Will repeat steroid taper and refill Lotrisone cream. Follow up if symptoms worsen or fail to improve. ? ?  ?  ?  ? ?Follow up plan: ?No follow-ups on file. ? ? ? ? ? ?

## 2021-08-16 ENCOUNTER — Encounter: Payer: Self-pay | Admitting: Nurse Practitioner

## 2021-08-16 ENCOUNTER — Ambulatory Visit (INDEPENDENT_AMBULATORY_CARE_PROVIDER_SITE_OTHER): Payer: Medicare Other | Admitting: Nurse Practitioner

## 2021-08-16 VITALS — BP 122/79 | HR 83 | Temp 98.4°F | Wt 203.6 lb

## 2021-08-16 DIAGNOSIS — L409 Psoriasis, unspecified: Secondary | ICD-10-CM

## 2021-08-16 MED ORDER — METHYLPREDNISOLONE 4 MG PO TBPK: ORAL_TABLET | ORAL | 0 refills | Status: DC

## 2021-08-16 MED ORDER — CLOTRIMAZOLE-BETAMETHASONE 1-0.05 % EX CREA: 1.0000 "application " | TOPICAL_CREAM | Freq: Two times a day (BID) | CUTANEOUS | 1 refills | Status: DC

## 2021-08-16 NOTE — Assessment & Plan Note (Signed)
New flare.  Had resolved after steroid treatment and cream.  Keep appointment with Dermatology.  Will repeat steroid taper and refill Lotrisone cream. Follow up if symptoms worsen or fail to improve. ?

## 2021-09-07 DIAGNOSIS — H524 Presbyopia: Secondary | ICD-10-CM | POA: Diagnosis not present

## 2021-09-16 ENCOUNTER — Other Ambulatory Visit: Payer: Self-pay | Admitting: Nurse Practitioner

## 2021-09-16 ENCOUNTER — Other Ambulatory Visit: Payer: Self-pay | Admitting: Family Medicine

## 2021-09-16 MED ORDER — TRAZODONE HCL 50 MG PO TABS: 25.0000 mg | ORAL_TABLET | Freq: Every evening | ORAL | 1 refills | Status: DC | PRN

## 2021-09-16 NOTE — Telephone Encounter (Signed)
Requested medication (s) are due for refill today: Yes  Requested medication (s) are on the active medication list: Yes  Last refill:  06/23/21  Future visit scheduled: Yes  Notes to clinic:  Historical provider.    Requested Prescriptions  Pending Prescriptions Disp Refills   traZODone (DESYREL) 50 MG tablet [Pharmacy Med Name: traZODone 50 MG TABLET] 90 tablet     Sig: TAKE 1/2 TO 1 TABLET BY MOUTH AT BEDTIME AS NEEDED FOR SLEEP     Psychiatry: Antidepressants - Serotonin Modulator Passed - 09/16/2021 10:42 AM      Passed - Valid encounter within last 6 months    Recent Outpatient Visits           1 month ago [place], NP   3 months ago [place] Kathrine [facility]add[facility], NP   5 months ago Stage 3b chronic kidney disease [facility])   [facility] Jon Billings, NP   10 months ago Post-COVID chronic cough   [facility] Jon Billings, NP   11 months ago [place], NP       Future Appointments             In 1 week Jon Billings, NP [facility], [place]            Yes

## 2021-09-19 ENCOUNTER — Other Ambulatory Visit: Payer: Self-pay

## 2021-09-19 DIAGNOSIS — E785 Hyperlipidemia, unspecified: Secondary | ICD-10-CM

## 2021-09-19 NOTE — Telephone Encounter (Signed)
Refill request from [facility] for Fenofibrate '145MG'$ . Last fill 06/16/2021 #[id number]. Next office visit scheduled for 09/27/2021. Please advise.

## 2021-09-19 NOTE — Telephone Encounter (Signed)
Can we see if he has enough to make it to his appt?

## 2021-09-19 NOTE — Telephone Encounter (Signed)
He has about 20 left so he is okay for waiting until appt with Santiago Glad on the 27th, thank you Dr. Wynetta Emery.

## 2021-09-20 DIAGNOSIS — D2272 Melanocytic nevi of left lower limb, including hip: Secondary | ICD-10-CM | POA: Diagnosis not present

## 2021-09-20 DIAGNOSIS — D2262 Melanocytic nevi of left upper limb, including shoulder: Secondary | ICD-10-CM | POA: Diagnosis not present

## 2021-09-20 DIAGNOSIS — D2261 Melanocytic nevi of right upper limb, including shoulder: Secondary | ICD-10-CM | POA: Diagnosis not present

## 2021-09-20 DIAGNOSIS — L570 Actinic keratosis: Secondary | ICD-10-CM | POA: Diagnosis not present

## 2021-09-20 DIAGNOSIS — D2271 Melanocytic nevi of right lower limb, including hip: Secondary | ICD-10-CM | POA: Diagnosis not present

## 2021-09-20 DIAGNOSIS — L821 Other seborrheic keratosis: Secondary | ICD-10-CM | POA: Diagnosis not present

## 2021-09-20 DIAGNOSIS — D225 Melanocytic nevi of trunk: Secondary | ICD-10-CM | POA: Diagnosis not present

## 2021-09-20 DIAGNOSIS — L308 Other specified dermatitis: Secondary | ICD-10-CM | POA: Diagnosis not present

## 2021-09-20 DIAGNOSIS — Z08 Encounter for follow-up examination after completed treatment for malignant neoplasm: Secondary | ICD-10-CM | POA: Diagnosis not present

## 2021-09-20 DIAGNOSIS — X32XXXA Exposure to sunlight, initial encounter: Secondary | ICD-10-CM | POA: Diagnosis not present

## 2021-09-20 DIAGNOSIS — Z85828 Personal history of other malignant neoplasm of skin: Secondary | ICD-10-CM | POA: Diagnosis not present

## 2021-09-20 DIAGNOSIS — D485 Neoplasm of uncertain behavior of skin: Secondary | ICD-10-CM | POA: Diagnosis not present

## 2021-09-26 NOTE — Progress Notes (Deleted)
There were no vitals taken for this visit.   Subjective:    Patient ID: Chris Daugherty, male    DOB: 1955-09-07, 66 y.o.   MRN: [id number]  HPI: Chris Daugherty is a 66 y.o. male presenting on 09/27/2021 for comprehensive medical examination. Current medical complaints include:{Blank single:19197::"none","***"}  He currently lives with: Interim Problems from his last visit: {Blank single:19197::"yes","no"}  HYPERTENSION / HYPERLIPIDEMIA Satisfied with current treatment? {Blank single:19197::"yes","no"} Duration of hypertension: {Blank single:19197::"chronic","months","years"} BP monitoring frequency: {Blank single:19197::"not checking","rarely","daily","weekly","monthly","a few times a day","a few times a week","a few times a month"} BP range:  BP medication side effects: {Blank single:19197::"yes","no"} Past BP meds: {Blank HLKTGYBW:38937::"DSKA","JGOTLXBWIO","MBTDHRCBUL/AGTXMIWOEH","OZYYQMGN","OIBBCWUGQB","VQXIHWTUUE/KCMK","LKJZPHXTAV (bystolic)","carvedilol","chlorthalidone","clonidine","diltiazem","exforge HCT","HCTZ","irbesartan (avapro)","labetalol","lisinopril","lisinopril-HCTZ","losartan (cozaar)","methyldopa","nifedipine","olmesartan (benicar)","olmesartan-HCTZ","quinapril","ramipril","spironalactone","tekturna","valsartan","valsartan-HCTZ","verapamil"} Duration of hyperlipidemia: {Blank single:19197::"chronic","months","years"} Cholesterol medication side effects: {Blank single:19197::"yes","no"} Cholesterol supplements: {Blank multiple:19196::"none","fish oil","niacin","red yeast rice"} Past cholesterol medications: {Blank multiple:19196::"none","atorvastain (lipitor)","lovastatin (mevacor)","pravastatin (pravachol)","rosuvastatin (crestor)","simvastatin (zocor)","vytorin","fenofibrate (tricor)","gemfibrozil","ezetimide (zetia)","niaspan","lovaza"} Medication compliance: {Blank single:19197::"excellent compliance","good compliance","fair compliance","poor compliance"} Aspirin:  {Blank single:19197::"yes","no"} Recent stressors: {Blank single:19197::"yes","no"} Recurrent headaches: {Blank single:19197::"yes","no"} Visual changes: {Blank single:19197::"yes","no"} Palpitations: {Blank single:19197::"yes","no"} Dyspnea: {Blank single:19197::"yes","no"} Chest pain: {Blank single:19197::"yes","no"} Lower extremity edema: {Blank single:19197::"yes","no"} Dizzy/lightheaded: {Blank single:19197::"yes","no"}   Depression Screen done today and results listed below:     08/16/2021   10:44 AM 05/24/2021   10:45 AM 03/29/2021   10:47 AM 12/28/2020    5:06 PM 03/31/2020   10:03 AM  Depression screen PHQ 2/9  Decreased Interest 0 0 0 0 0  Down, Depressed, Hopeless 0 0 0 0 0  PHQ - 2 Score 0 0 0 0 0  Altered sleeping 0 0 1    Tired, decreased energy 0 0 0    Change in appetite 0 0 0    Feeling bad or failure about yourself  0 0 0    Trouble concentrating 0 0 0    Moving slowly or fidgety/restless 0 0 0    Suicidal thoughts 0 0 0    PHQ-9 Score 0 0 1    Difficult doing work/chores Not difficult at all Not difficult at all Not difficult at all      The patient {has/does not have:19849} a history of falls. I {did/did not:19850} complete a risk assessment for falls. A plan of care for falls {was/was not:19852} documented.   Past Medical History:  Past Medical History:  Diagnosis Date   Acid reflux    Allergic rhinitis    Allergy    Anxiety    Depression    Hyperlipidemia    Hypertension    Testicular hypofunction     Surgical History:  Past Surgical History:  Procedure Laterality Date   BASAL CELL CARCINOMA EXCISION     left side of face   CHOLECYSTECTOMY     COLONOSCOPY  04/30/2014   GASTRIC FUNDOPLICATION     INGUINAL HERNIA REPAIR     PARTIAL NEPHRECTOMY Left    VASECTOMY      Medications:  Current Outpatient Medications on File Prior to Visit  Medication Sig   amLODipine (NORVASC) 10 MG tablet Take 1 tablet (10 mg total) by mouth daily.    Azelastine-Fluticasone (DYMISTA) 137-50 MCG/ACT SUSP Place 1 puff into the nose 2 (two) times daily.   clotrimazole-betamethasone (LOTRISONE) cream Apply 1 application. topically 2 (two) times daily.   famotidine (PEPCID) 20 MG tablet One after supper   fenofibrate (TRICOR) 145 MG tablet TAKE ONE TABLET BY MOUTH DAILY   gabapentin (NEURONTIN) 100 MG capsule TAKE ONE CAPSULE BY MOUTH FOUR TIMES A DAY (Patient not taking: Reported on 08/16/2021)  losartan (COZAAR) 100 MG tablet Take 1 tablet (100 mg total) by mouth daily.   methocarbamol (ROBAXIN) 750 MG tablet TAKE ONE TABLET BY MOUTH TWICE A DAY   methylPREDNISolone (MEDROL DOSEPAK) 4 MG TBPK tablet As directed   pantoprazole (PROTONIX) 40 MG tablet TAKE ONE TABLET BY MOUTH DAILY 30-60 MINUTES BEFORE FIRST MEAL OF THE DAY   promethazine (PHENERGAN) 25 MG tablet TAKE ONE TABLET BY MOUTH EVERY SIX HOURS AS NEEDED FOR NAUSEA AND VOMITING   sertraline (ZOLOFT) 100 MG tablet Take 2 tablets (200 mg total) by mouth daily.   SUMAtriptan (IMITREX) 100 MG tablet TAKE ONE TABLET BY MOUTH DAILY AS NEEDED   traZODone (DESYREL) 50 MG tablet Take 50 mg by mouth at bedtime.   traZODone (DESYREL) 50 MG tablet Take 0.5-1 tablets (25-50 mg total) by mouth at bedtime as needed for sleep.   triamcinolone (KENALOG) 0.1 % Apply 1 application topically 2 (two) times daily.   No current facility-administered medications on file prior to visit.    Allergies:  No Known Allergies  Social History:  Social History   Socioeconomic History   Marital status: Married    Spouse name: Not on file   Number of children: Not on file   Years of education: Not on file   Highest education level: Not on file  Occupational History   Not on file  Tobacco Use   Smoking status: Former    Packs/day: 1.00    Years: 3.00    Total pack years: 3.00    Types: Cigarettes    Quit date: 81    Years since quitting: 46.5   Smokeless tobacco: Never  Vaping Use   Vaping Use: Never  used  Substance and Sexual Activity   Alcohol use: No   Drug use: No   Sexual activity: Yes  Other Topics Concern   Not on file  Social History Narrative   Not on file   Social Determinants of Health   Financial Resource Strain: Low Risk  (12/28/2020)   Overall Financial Resource Strain (CARDIA)    Difficulty of Paying Living Expenses: Not hard at all  Food Insecurity: No Food Insecurity (12/28/2020)   Hunger Vital Sign    Worried About Running Out of Food in the Last Year: Never true    [place] in the Last Year: Never true  Transportation Needs: No Transportation Needs (12/28/2020)   PRAPARE - Hydrologist (Medical): No    Lack of Transportation (Non-Medical): No  Physical Activity: Inactive (12/28/2020)   Exercise Vital Sign    Days of Exercise per Week: 0 days    Minutes of Exercise per Session: 10 min  Stress: No Stress Concern Present (12/28/2020)   [place]    Feeling of Stress : Not at all  Social Connections: [place] (12/28/2020)   Social Connection and Isolation Panel [NHANES]    Frequency of Communication with Friends and Family: More than three times a week    Frequency of Social Gatherings with Friends and Family: Twice a week    Attends Religious Services: More than 4 times per year    Active Member of [employer] or Organizations: Yes    Attends Archivist Meetings: More than 4 times per year    Marital Status: Married  Human resources officer Violence: Not At Risk (12/28/2020)   Humiliation, Afraid, Rape, and Kick questionnaire    Fear of Current  or Ex-Partner: No    Emotionally Abused: No    Physically Abused: No    Sexually Abused: No   Social History   Tobacco Use  Smoking Status Former   Packs/day: 1.00   Years: 3.00   Total pack years: 3.00   Types: Cigarettes   Quit date: 30   Years since quitting: 46.5  Smokeless Tobacco Never    Social History   Substance and Sexual Activity  Alcohol Use No    Family History:  Family History  Problem Relation Age of Onset   Cancer Maternal Grandfather 38       stomaCH   Hypertension Mother     Past medical history, surgical history, medications, allergies, family history and social history reviewed with patient today and changes made to appropriate areas of the chart.   ROS All other ROS negative except what is listed above and in the HPI.      Objective:    There were no vitals taken for this visit.  Wt Readings from Last 3 Encounters:  08/16/21 203 lb 9.6 oz (92.4 kg)  05/24/21 205 lb 9.6 oz (93.3 kg)  03/29/21 211 lb 12.8 oz (96.1 kg)    Physical Exam  Results for orders placed or performed in visit on 03/29/21  Comp Met (CMET)  Result Value Ref Range   Glucose 126 (H) 70 - 99 mg/dL   BUN 26 8 - 27 mg/dL   Creatinine, Ser 1.43 (H) 0.76 - 1.27 mg/dL   eGFR 54 (L) >59 mL/min/1.73   BUN/Creatinine Ratio 18 10 - 24   Sodium 144 134 - 144 mmol/L   Potassium 4.0 3.5 - 5.2 mmol/L   Chloride 105 96 - 106 mmol/L   CO2 24 20 - 29 mmol/L   Calcium 9.1 8.6 - 10.2 mg/dL   Total Protein 6.4 6.0 - 8.5 g/dL   Albumin 4.7 3.8 - 4.8 g/dL   Globulin, Total 1.7 1.5 - 4.5 g/dL   Albumin/Globulin Ratio 2.8 (H) 1.2 - 2.2   Bilirubin Total 0.3 0.0 - 1.2 mg/dL   Alkaline Phosphatase 54 44 - 121 IU/L   AST 20 0 - 40 IU/L   ALT 15 0 - 44 IU/L  Lipid Profile  Result Value Ref Range   Cholesterol, Total 183 100 - 199 mg/dL   Triglycerides 167 (H) 0 - 149 mg/dL   HDL 35 (L) >39 mg/dL   VLDL Cholesterol Cal 30 5 - 40 mg/dL   LDL Chol Calc (NIH) 118 (H) 0 - 99 mg/dL   Chol/HDL Ratio 5.2 (H) 0.0 - 5.0 ratio      Assessment & Plan:   Problem List Items Addressed This Visit       Cardiovascular and Mediastinum   Essential (primary) hypertension - Primary     Genitourinary   Stage 3b chronic kidney disease ([place])     Other   Anxiety   Hyperlipidemia      Discussed aspirin prophylaxis for myocardial infarction prevention and decision was {Blank single:19197::"it was not indicated","made to continue ASA","made to start ASA","made to stop ASA","that we recommended ASA, and patient refused"}  LABORATORY TESTING:  Health maintenance labs ordered today as discussed above.   The natural history of prostate cancer and ongoing controversy regarding screening and potential treatment outcomes of prostate cancer has been discussed with the patient. The meaning of a false positive PSA and a false negative PSA has been discussed. He indicates understanding of the limitations of this screening test  and wishes *** to proceed with screening PSA testing.   IMMUNIZATIONS:   - Tdap: Tetanus vaccination status reviewed: {tetanus status:315746}. - Influenza: {Blank single:19197::"Up to date","Administered today","Postponed to flu season","Refused","Given elsewhere"} - Pneumovax: {Blank single:19197::"Up to date","Administered today","Not applicable","Refused","Given elsewhere"} - Prevnar: {Blank single:19197::"Up to date","Administered today","Not applicable","Refused","Given elsewhere"} - COVID: {Blank single:19197::"Up to date","Administered today","Not applicable","Refused","Given elsewhere"} - HPV: {Blank single:19197::"Up to date","Administered today","Not applicable","Refused","Given elsewhere"} - Shingrix vaccine: {Blank single:19197::"Up to date","Administered today","Not applicable","Refused","Given elsewhere"}  SCREENING: - Colonoscopy: {Blank single:19197::"Up to date","Ordered today","Not applicable","Refused","Done elsewhere"}  Discussed with patient purpose of the colonoscopy is to detect colon cancer at curable precancerous or early stages   - AAA Screening: {Blank single:19197::"Up to date","Ordered today","Not applicable","Refused","Done elsewhere"}  -Hearing Test: {Blank single:19197::"Up to date","Ordered today","Not  applicable","Refused","Done elsewhere"}  -Spirometry: {Blank single:19197::"Up to date","Ordered today","Not applicable","Refused","Done elsewhere"}   PATIENT COUNSELING:    Sexuality: Discussed sexually transmitted diseases, partner selection, use of condoms, avoidance of unintended pregnancy  and contraceptive alternatives.   Advised to avoid cigarette smoking.  I discussed with the patient that most people either abstain from alcohol or drink within safe limits (<=14/week and <=4 drinks/occasion for males, <=7/weeks and <= 3 drinks/occasion for females) and that the risk for alcohol disorders and other health effects rises proportionally with the number of drinks per week and how often a drinker exceeds daily limits.  Discussed cessation/primary prevention of drug use and availability of treatment for abuse.   Diet: Encouraged to adjust caloric intake to maintain  or achieve ideal body weight, to reduce intake of dietary saturated fat and total fat, to limit sodium intake by avoiding high sodium foods and not adding table salt, and to maintain adequate dietary potassium and calcium preferably from fresh fruits, vegetables, and low-fat dairy products.    stressed the importance of regular exercise  Injury prevention: Discussed safety belts, safety helmets, smoke detector, smoking near bedding or upholstery.   Dental health: Discussed importance of regular tooth brushing, flossing, and dental visits.   Follow up plan: NEXT PREVENTATIVE PHYSICAL DUE IN 1 YEAR. No follow-ups on file.

## 2021-09-27 ENCOUNTER — Encounter: Payer: Medicare Other | Admitting: Nurse Practitioner

## 2021-10-06 ENCOUNTER — Other Ambulatory Visit: Payer: Self-pay

## 2021-10-06 ENCOUNTER — Other Ambulatory Visit: Payer: Self-pay | Admitting: Internal Medicine

## 2021-10-06 DIAGNOSIS — E785 Hyperlipidemia, unspecified: Secondary | ICD-10-CM

## 2021-10-06 MED ORDER — FENOFIBRATE 145 MG PO TABS: 145.0000 mg | ORAL_TABLET | Freq: Every day | ORAL | 1 refills | Status: DC

## 2021-10-06 NOTE — Telephone Encounter (Signed)
Refill request for fenofibrate, last fill date 06/16/2021. Please advise. Next OV scheduled for 10/25/21

## 2021-10-24 NOTE — Progress Notes (Unsigned)
There were no vitals taken for this visit.   Subjective:    Patient ID: Chris Daugherty, male    DOB: May 22, 1955, 66 y.o.   MRN: [id number]  HPI: Chris Daugherty is a 66 y.o. male presenting on 10/25/2021 for comprehensive medical examination. Current medical complaints include:{Blank single:19197::"none","***"}  He currently lives with: Interim Problems from his last visit: {Blank single:19197::"yes","no"}  HYPERTENSION / HYPERLIPIDEMIA Satisfied with current treatment? {Blank single:19197::"yes","no"} Duration of hypertension: {Blank single:19197::"chronic","months","years"} BP monitoring frequency: {Blank single:19197::"not checking","rarely","daily","weekly","monthly","a few times a day","a few times a week","a few times a month"} BP range:  BP medication side effects: {Blank single:19197::"yes","no"} Past BP meds: {Blank XIDHWYSH:68372::"BMSX","JDBZMCEYEM","VVKPQAESLP/NPYYFRTMYT","RZNBVAPO","LIDCVUDTHY","HOOILNZVJK/QASU","ORVIFBPPHK (bystolic)","carvedilol","chlorthalidone","clonidine","diltiazem","exforge HCT","HCTZ","irbesartan (avapro)","labetalol","lisinopril","lisinopril-HCTZ","losartan (cozaar)","methyldopa","nifedipine","olmesartan (benicar)","olmesartan-HCTZ","quinapril","ramipril","spironalactone","tekturna","valsartan","valsartan-HCTZ","verapamil"} Duration of hyperlipidemia: {Blank single:19197::"chronic","months","years"} Cholesterol medication side effects: {Blank single:19197::"yes","no"} Cholesterol supplements: {Blank multiple:19196::"none","fish oil","niacin","red yeast rice"} Past cholesterol medications: {Blank multiple:19196::"none","atorvastain (lipitor)","lovastatin (mevacor)","pravastatin (pravachol)","rosuvastatin (crestor)","simvastatin (zocor)","vytorin","fenofibrate (tricor)","gemfibrozil","ezetimide (zetia)","niaspan","lovaza"} Medication compliance: {Blank single:19197::"excellent compliance","good compliance","fair compliance","poor compliance"} Aspirin:  {Blank single:19197::"yes","no"} Recent stressors: {Blank single:19197::"yes","no"} Recurrent headaches: {Blank single:19197::"yes","no"} Visual changes: {Blank single:19197::"yes","no"} Palpitations: {Blank single:19197::"yes","no"} Dyspnea: {Blank single:19197::"yes","no"} Chest pain: {Blank single:19197::"yes","no"} Lower extremity edema: {Blank single:19197::"yes","no"} Dizzy/lightheaded: {Blank single:19197::"yes","no"}   Depression Screen done today and results listed below:     08/16/2021   10:44 AM 05/24/2021   10:45 AM 03/29/2021   10:47 AM 12/28/2020    5:06 PM 03/31/2020   10:03 AM  Depression screen PHQ 2/9  Decreased Interest 0 0 0 0 0  Down, Depressed, Hopeless 0 0 0 0 0  PHQ - 2 Score 0 0 0 0 0  Altered sleeping 0 0 1    Tired, decreased energy 0 0 0    Change in appetite 0 0 0    Feeling bad or failure about yourself  0 0 0    Trouble concentrating 0 0 0    Moving slowly or fidgety/restless 0 0 0    Suicidal thoughts 0 0 0    PHQ-9 Score 0 0 1    Difficult doing work/chores Not difficult at all Not difficult at all Not difficult at all      The patient {has/does not have:19849} a history of falls. I {did/did not:19850} complete a risk assessment for falls. A plan of care for falls {was/was not:19852} documented.   Past Medical History:  Past Medical History:  Diagnosis Date  . Acid reflux   . Allergic rhinitis   . Allergy   . Anxiety   . Depression   . Hyperlipidemia   . Hypertension   . Testicular hypofunction     Surgical History:  Past Surgical History:  Procedure Laterality Date  . BASAL CELL CARCINOMA EXCISION     left side of face  . CHOLECYSTECTOMY    . COLONOSCOPY  04/30/2014  . GASTRIC FUNDOPLICATION    . INGUINAL HERNIA REPAIR    . PARTIAL NEPHRECTOMY Left   . VASECTOMY      Medications:  Current Outpatient Medications on File Prior to Visit  Medication Sig  . amLODipine (NORVASC) 10 MG tablet Take 1 tablet (10 mg total) by  mouth daily.  . Azelastine-Fluticasone (DYMISTA) 137-50 MCG/ACT SUSP Place 1 puff into the nose 2 (two) times daily.  . clotrimazole-betamethasone (LOTRISONE) cream Apply 1 application. topically 2 (two) times daily.  . famotidine (PEPCID) 20 MG tablet One after supper  . fenofibrate (TRICOR) 145 MG tablet Take 1 tablet (145 mg total) by mouth daily.  Marland Kitchen gabapentin (NEURONTIN) 100 MG capsule TAKE ONE CAPSULE BY MOUTH FOUR TIMES A DAY (Patient not taking: Reported on  08/16/2021)  . losartan (COZAAR) 100 MG tablet Take 1 tablet (100 mg total) by mouth daily.  . methocarbamol (ROBAXIN) 750 MG tablet TAKE ONE TABLET BY MOUTH TWICE A DAY  . methylPREDNISolone (MEDROL DOSEPAK) 4 MG TBPK tablet As directed  . pantoprazole (PROTONIX) 40 MG tablet TAKE ONE TABLET BY MOUTH DAILY 30-60 MINUTES BEFORE FIRST MEAL OF THE DAY  . promethazine (PHENERGAN) 25 MG tablet TAKE ONE TABLET BY MOUTH EVERY SIX HOURS AS NEEDED FOR NAUSEA AND VOMITING  . sertraline (ZOLOFT) 100 MG tablet Take 2 tablets (200 mg total) by mouth daily.  . SUMAtriptan (IMITREX) 100 MG tablet TAKE ONE TABLET BY MOUTH DAILY AS NEEDED  . traZODone (DESYREL) 50 MG tablet Take 50 mg by mouth at bedtime.  . traZODone (DESYREL) 50 MG tablet Take 0.5-1 tablets (25-50 mg total) by mouth at bedtime as needed for sleep.  Marland Kitchen triamcinolone (KENALOG) 0.1 % Apply 1 application topically 2 (two) times daily.   No current facility-administered medications on file prior to visit.    Allergies:  No Known Allergies  Social History:  Social History   Socioeconomic History  . Marital status: Married    Spouse name: Not on file  . Number of children: Not on file  . Years of education: Not on file  . Highest education level: Not on file  Occupational History  . Not on file  Tobacco Use  . Smoking status: Former    Packs/day: 1.00    Years: 3.00    Total pack years: 3.00    Types: Cigarettes    Quit date: 1977    Years since quitting: 46.5  .  Smokeless tobacco: Never  Vaping Use  . Vaping Use: Never used  Substance and Sexual Activity  . Alcohol use: No  . Drug use: No  . Sexual activity: Yes  Other Topics Concern  . Not on file  Social History Narrative  . Not on file   Social Determinants of Health   Financial Resource Strain: Low Risk  (12/28/2020)   Overall Financial Resource Strain (CARDIA)   . Difficulty of Paying Living Expenses: Not hard at all  Food Insecurity: No Food Insecurity (12/28/2020)   Hunger Vital Sign   . Worried About Charity fundraiser in the Last Year: Never true   . Ran Out of Food in the Last Year: Never true  Transportation Needs: No Transportation Needs (12/28/2020)   PRAPARE - Transportation   . Lack of Transportation (Medical): No   . Lack of Transportation (Non-Medical): No  Physical Activity: Inactive (12/28/2020)   Exercise Vital Sign   . Days of Exercise per Week: 0 days   . Minutes of Exercise per Session: 10 min  Stress: No Stress Concern Present (12/28/2020)   [place]   . Feeling of Stress : Not at all  Social Connections: Socially Integrated (12/28/2020)   Social Connection and Isolation Panel [NHANES]   . Frequency of Communication with Friends and Family: More than three times a week   . Frequency of Social Gatherings with Friends and Family: Twice a week   . Attends Religious Services: More than 4 times per year   . Active Member of Clubs or Organizations: Yes   . Attends Archivist Meetings: More than 4 times per year   . Marital Status: Married  Human resources officer Violence: Not At Risk (12/28/2020)   Humiliation, Afraid, Rape, and Kick questionnaire   .  Fear of Current or Ex-Partner: No   . Emotionally Abused: No   . Physically Abused: No   . Sexually Abused: No   Social History   Tobacco Use  Smoking Status Former  . Packs/day: 1.00  . Years: 3.00  . Total pack years: 3.00  . Types:  Cigarettes  . Quit date: 21  . Years since quitting: 46.5  Smokeless Tobacco Never   Social History   Substance and Sexual Activity  Alcohol Use No    Family History:  Family History  Problem Relation Age of Onset  . Cancer Maternal Grandfather 36       stomaCH  . Hypertension Mother     Past medical history, surgical history, medications, allergies, family history and social history reviewed with patient today and changes made to appropriate areas of the chart.   ROS All other ROS negative except what is listed above and in the HPI.      Objective:    There were no vitals taken for this visit.  Wt Readings from Last 3 Encounters:  08/16/21 203 lb 9.6 oz (92.4 kg)  05/24/21 205 lb 9.6 oz (93.3 kg)  03/29/21 211 lb 12.8 oz (96.1 kg)    Physical Exam  Results for orders placed or performed in visit on 03/29/21  Comp Met (CMET)  Result Value Ref Range   Glucose 126 (H) 70 - 99 mg/dL   BUN 26 8 - 27 mg/dL   Creatinine, Ser 1.43 (H) 0.76 - 1.27 mg/dL   eGFR 54 (L) >59 mL/min/1.73   BUN/Creatinine Ratio 18 10 - 24   Sodium 144 134 - 144 mmol/L   Potassium 4.0 3.5 - 5.2 mmol/L   Chloride 105 96 - 106 mmol/L   CO2 24 20 - 29 mmol/L   Calcium 9.1 8.6 - 10.2 mg/dL   Total Protein 6.4 6.0 - 8.5 g/dL   Albumin 4.7 3.8 - 4.8 g/dL   Globulin, Total 1.7 1.5 - 4.5 g/dL   Albumin/Globulin Ratio 2.8 (H) 1.2 - 2.2   Bilirubin Total 0.3 0.0 - 1.2 mg/dL   Alkaline Phosphatase 54 44 - 121 IU/L   AST 20 0 - 40 IU/L   ALT 15 0 - 44 IU/L  Lipid Profile  Result Value Ref Range   Cholesterol, Total 183 100 - 199 mg/dL   Triglycerides 167 (H) 0 - 149 mg/dL   HDL 35 (L) >39 mg/dL   VLDL Cholesterol Cal 30 5 - 40 mg/dL   LDL Chol Calc (NIH) 118 (H) 0 - 99 mg/dL   Chol/HDL Ratio 5.2 (H) 0.0 - 5.0 ratio      Assessment & Plan:   Problem List Items Addressed This Visit      Cardiovascular and Mediastinum   Essential (primary) hypertension     Genitourinary   Stage 3b  chronic kidney disease ([place])     Other   Anxiety   Hyperlipidemia  Other Visit Diagnoses    Annual physical exam    -  Primary       Discussed aspirin prophylaxis for myocardial infarction prevention and decision was {Blank single:19197::"it was not indicated","made to continue ASA","made to start ASA","made to stop ASA","that we recommended ASA, and patient refused"}  LABORATORY TESTING:  Health maintenance labs ordered today as discussed above.   The natural history of prostate cancer and ongoing controversy regarding screening and potential treatment outcomes of prostate cancer has been discussed with the patient. The meaning of a false positive PSA  and a false negative PSA has been discussed. He indicates understanding of the limitations of this screening test and wishes *** to proceed with screening PSA testing.   IMMUNIZATIONS:   - Tdap: Tetanus vaccination status reviewed: {tetanus status:315746}. - Influenza: {Blank single:19197::"Up to date","Administered today","Postponed to flu season","Refused","Given elsewhere"} - Pneumovax: {Blank single:19197::"Up to date","Administered today","Not applicable","Refused","Given elsewhere"} - Prevnar: {Blank single:19197::"Up to date","Administered today","Not applicable","Refused","Given elsewhere"} - COVID: {Blank single:19197::"Up to date","Administered today","Not applicable","Refused","Given elsewhere"} - HPV: {Blank single:19197::"Up to date","Administered today","Not applicable","Refused","Given elsewhere"} - Shingrix vaccine: {Blank single:19197::"Up to date","Administered today","Not applicable","Refused","Given elsewhere"}  SCREENING: - Colonoscopy: {Blank single:19197::"Up to date","Ordered today","Not applicable","Refused","Done elsewhere"}  Discussed with patient purpose of the colonoscopy is to detect colon cancer at curable precancerous or early stages   - AAA Screening: {Blank single:19197::"Up to date","Ordered today","Not  applicable","Refused","Done elsewhere"}  -Hearing Test: {Blank single:19197::"Up to date","Ordered today","Not applicable","Refused","Done elsewhere"}  -Spirometry: {Blank single:19197::"Up to date","Ordered today","Not applicable","Refused","Done elsewhere"}   PATIENT COUNSELING:    Sexuality: Discussed sexually transmitted diseases, partner selection, use of condoms, avoidance of unintended pregnancy  and contraceptive alternatives.   Advised to avoid cigarette smoking.  I discussed with the patient that most people either abstain from alcohol or drink within safe limits (<=14/week and <=4 drinks/occasion for males, <=7/weeks and <= 3 drinks/occasion for females) and that the risk for alcohol disorders and other health effects rises proportionally with the number of drinks per week and how often a drinker exceeds daily limits.  Discussed cessation/primary prevention of drug use and availability of treatment for abuse.   Diet: Encouraged to adjust caloric intake to maintain  or achieve ideal body weight, to reduce intake of dietary saturated fat and total fat, to limit sodium intake by avoiding high sodium foods and not adding table salt, and to maintain adequate dietary potassium and calcium preferably from fresh fruits, vegetables, and low-fat dairy products.    stressed the importance of regular exercise  Injury prevention: Discussed safety belts, safety helmets, smoke detector, smoking near bedding or upholstery.   Dental health: Discussed importance of regular tooth brushing, flossing, and dental visits.   Follow up plan: NEXT PREVENTATIVE PHYSICAL DUE IN 1 YEAR. No follow-ups on file.

## 2021-10-25 ENCOUNTER — Ambulatory Visit (INDEPENDENT_AMBULATORY_CARE_PROVIDER_SITE_OTHER): Payer: Medicare Other | Admitting: Nurse Practitioner

## 2021-10-25 ENCOUNTER — Encounter: Payer: Self-pay | Admitting: Nurse Practitioner

## 2021-10-25 VITALS — BP 117/74 | HR 70 | Temp 98.4°F | Ht 70.08 in | Wt 202.6 lb

## 2021-10-25 DIAGNOSIS — F419 Anxiety disorder, unspecified: Secondary | ICD-10-CM | POA: Diagnosis not present

## 2021-10-25 DIAGNOSIS — E785 Hyperlipidemia, unspecified: Secondary | ICD-10-CM | POA: Diagnosis not present

## 2021-10-25 DIAGNOSIS — I10 Essential (primary) hypertension: Secondary | ICD-10-CM | POA: Diagnosis not present

## 2021-10-25 DIAGNOSIS — Z23 Encounter for immunization: Secondary | ICD-10-CM | POA: Diagnosis not present

## 2021-10-25 DIAGNOSIS — Z0000 Encounter for general adult medical examination without abnormal findings: Secondary | ICD-10-CM | POA: Diagnosis not present

## 2021-10-25 DIAGNOSIS — R946 Abnormal results of thyroid function studies: Secondary | ICD-10-CM | POA: Diagnosis not present

## 2021-10-25 DIAGNOSIS — N1832 Chronic kidney disease, stage 3b: Secondary | ICD-10-CM

## 2021-10-25 LAB — URINALYSIS, ROUTINE W REFLEX MICROSCOPIC
Bilirubin, UA: NEGATIVE
Glucose, UA: NEGATIVE
Ketones, UA: NEGATIVE
Leukocytes,UA: NEGATIVE
Nitrite, UA: NEGATIVE
Protein,UA: NEGATIVE
RBC, UA: NEGATIVE
Specific Gravity, UA: 1.025 (ref 1.005–1.030)
Urobilinogen, Ur: 0.2 mg/dL (ref 0.2–1.0)
pH, UA: 5.5 (ref 5.0–7.5)

## 2021-10-25 MED ORDER — AMLODIPINE BESYLATE 10 MG PO TABS: 10.0000 mg | ORAL_TABLET | Freq: Every day | ORAL | 1 refills | Status: DC

## 2021-10-25 MED ORDER — LOSARTAN POTASSIUM 100 MG PO TABS: 100.0000 mg | ORAL_TABLET | Freq: Every day | ORAL | 1 refills | Status: DC

## 2021-10-25 MED ORDER — TRAZODONE HCL 50 MG PO TABS: 25.0000 mg | ORAL_TABLET | Freq: Every evening | ORAL | 1 refills | Status: DC | PRN

## 2021-10-25 MED ORDER — PANTOPRAZOLE SODIUM 40 MG PO TBEC: DELAYED_RELEASE_TABLET | ORAL | 1 refills | Status: DC

## 2021-10-25 MED ORDER — SERTRALINE HCL 100 MG PO TABS: 200.0000 mg | ORAL_TABLET | Freq: Every day | ORAL | 1 refills | Status: DC

## 2021-10-25 MED ORDER — FENOFIBRATE 145 MG PO TABS: 145.0000 mg | ORAL_TABLET | Freq: Every day | ORAL | 1 refills | Status: DC

## 2021-10-25 NOTE — Assessment & Plan Note (Signed)
Chronic.  Controlled.  Continue with current medication regimen of Amlodipine '10mg'$  daily and Losartan '100mg'$  daily.  Refills sent today.  Labs ordered today.  Return to clinic in 6 months for reevaluation.  Call sooner if concerns arise.

## 2021-10-25 NOTE — Assessment & Plan Note (Signed)
Chronic.  Controlled.  Continue with current medication regimen of Zoloft '100mg'$  daily.  Refills sent today.  Labs ordered today.  Return to clinic in 6 months for reevaluation.  Call sooner if concerns arise.

## 2021-10-25 NOTE — Assessment & Plan Note (Signed)
Chronic. Stable.  Labs ordered today. Will make recommendations based on lab results.

## 2021-10-25 NOTE — Assessment & Plan Note (Signed)
Labs ordered today. Will make recommendations based on lab results. Continue with Tricor.  Follow up in 6 months.  Call sooner if concerns arise.

## 2021-10-26 LAB — COMPREHENSIVE METABOLIC PANEL
ALT: 16 IU/L (ref 0–44)
AST: 24 IU/L (ref 0–40)
Albumin/Globulin Ratio: 2.5 — ABNORMAL HIGH (ref 1.2–2.2)
Albumin: 4.7 g/dL (ref 3.9–4.9)
Alkaline Phosphatase: 52 IU/L (ref 44–121)
BUN/Creatinine Ratio: 17 (ref 10–24)
BUN: 25 mg/dL (ref 8–27)
Bilirubin Total: 0.3 mg/dL (ref 0.0–1.2)
CO2: 23 mmol/L (ref 20–29)
Calcium: 9.6 mg/dL (ref 8.6–10.2)
Chloride: 101 mmol/L (ref 96–106)
Creatinine, Ser: 1.45 mg/dL — ABNORMAL HIGH (ref 0.76–1.27)
Globulin, Total: 1.9 g/dL (ref 1.5–4.5)
Glucose: 98 mg/dL (ref 70–99)
Potassium: 4.2 mmol/L (ref 3.5–5.2)
Sodium: 139 mmol/L (ref 134–144)
Total Protein: 6.6 g/dL (ref 6.0–8.5)
eGFR: 53 mL/min/{1.73_m2} — ABNORMAL LOW (ref >59)

## 2021-10-26 LAB — LIPID PANEL
Chol/HDL Ratio: 5.1 ratio — ABNORMAL HIGH (ref 0.0–5.0)
Cholesterol, Total: 178 mg/dL (ref 100–199)
HDL: 35 mg/dL — ABNORMAL LOW (ref >39)
LDL Chol Calc (NIH): 107 mg/dL — ABNORMAL HIGH (ref 0–99)
Triglycerides: 209 mg/dL — ABNORMAL HIGH (ref 0–149)
VLDL Cholesterol Cal: 36 mg/dL (ref 5–40)

## 2021-10-26 LAB — CBC WITH DIFFERENTIAL/PLATELET
Basophils Absolute: 0 10*3/uL (ref 0.0–0.2)
Basos: 1 %
EOS (ABSOLUTE): 0.1 10*3/uL (ref 0.0–0.4)
Eos: 4 %
Hematocrit: 37.7 % (ref 37.5–51.0)
Hemoglobin: 12.8 g/dL — ABNORMAL LOW (ref 13.0–17.7)
Immature Grans (Abs): 0 10*3/uL (ref 0.0–0.1)
Immature Granulocytes: 0 %
Lymphocytes Absolute: 0.7 10*3/uL (ref 0.7–3.1)
Lymphs: 26 %
MCH: 28.2 pg (ref 26.6–33.0)
MCHC: 34 g/dL (ref 31.5–35.7)
MCV: 83 fL (ref 79–97)
Monocytes Absolute: 0.3 10*3/uL (ref 0.1–0.9)
Monocytes: 10 %
Neutrophils Absolute: 1.5 10*3/uL (ref 1.4–7.0)
Neutrophils: 59 %
Platelets: 134 10*3/uL — ABNORMAL LOW (ref 150–450)
RBC: 4.54 x10E6/uL (ref 4.14–5.80)
RDW: 13.9 % (ref 11.6–15.4)
WBC: 2.6 10*3/uL — ABNORMAL LOW (ref 3.4–10.8)

## 2021-10-26 LAB — TSH: TSH: 4.83 u[IU]/mL — ABNORMAL HIGH (ref 0.450–4.500)

## 2021-10-26 LAB — PSA: Prostate Specific Ag, Serum: 0.5 ng/mL (ref 0.0–4.0)

## 2021-10-26 NOTE — Progress Notes (Signed)
HI Mr. Earnest. It was nice to see you yesterday.  Your lab work shows that your thyroid is slightly abnormal.  We will recheck this as a nurse visit in 6 weeks to make sure it returns to normal.    Your triglycerides are still elevated.  Continue with the Tricor.  Also recommend decreasing processed foods and refined sugar intake.   Your kidney function is stable.  Your prostate cancer screening is normal.  No other concerns at this time.  Please just make a lab appt to repeat the thyroid labs.

## 2021-10-26 NOTE — Addendum Note (Signed)
Addended by: Jon Billings on: 10/26/2021 08:03 AM   Modules accepted: Orders

## 2021-11-01 DIAGNOSIS — M65332 Trigger finger, left middle finger: Secondary | ICD-10-CM | POA: Diagnosis not present

## 2021-12-13 DIAGNOSIS — Z03818 Encounter for observation for suspected exposure to other biological agents ruled out: Secondary | ICD-10-CM | POA: Diagnosis not present

## 2021-12-13 DIAGNOSIS — H9202 Otalgia, left ear: Secondary | ICD-10-CM | POA: Diagnosis not present

## 2021-12-13 DIAGNOSIS — H60502 Unspecified acute noninfective otitis externa, left ear: Secondary | ICD-10-CM | POA: Diagnosis not present

## 2021-12-13 DIAGNOSIS — M65332 Trigger finger, left middle finger: Secondary | ICD-10-CM | POA: Diagnosis not present

## 2021-12-13 DIAGNOSIS — R051 Acute cough: Secondary | ICD-10-CM | POA: Diagnosis not present

## 2021-12-19 ENCOUNTER — Telehealth: Payer: Self-pay

## 2021-12-19 NOTE — Telephone Encounter (Signed)
Called and LVM asking for patient to please return my call. Patient is coming up due for AWV. Needs to be AFTER 12/28/21. Can schedule on 12/29/21 or 12/30/21 if the patient is available.   OK for PEC to speak to patient if he calls back.

## 2021-12-30 ENCOUNTER — Ambulatory Visit (INDEPENDENT_AMBULATORY_CARE_PROVIDER_SITE_OTHER): Payer: Medicare Other

## 2021-12-30 DIAGNOSIS — Z0000 Encounter for general adult medical examination without abnormal findings: Secondary | ICD-10-CM | POA: Diagnosis not present

## 2021-12-30 NOTE — Progress Notes (Signed)
I connected with  Chris Daugherty on 12/30/21 by a audio enabled telemedicine application and verified that I am speaking with the correct person using two identifiers.  Patient Location: Home  Provider Location: Office/Clinic  I discussed the limitations of evaluation and management by telemedicine. The patient expressed understanding and agreed to proceed.    Subjective:   Chris Daugherty is a 66 y.o. male who presents for Medicare Annual/Subsequent preventive examination.  Review of Systems    Defer to PCP.    Objective:    There were no vitals filed for this visit. There is no height or weight on file to calculate BMI.     12/01/2018    9:56 AM  Advanced Directives  Does Patient Have a Medical Advance Directive? No  Would patient like information on creating a medical advance directive? No - Patient declined    Current Medications (verified) Outpatient Encounter Medications as of 12/30/2021  Medication Sig   amLODipine (NORVASC) 10 MG tablet Take 1 tablet (10 mg total) by mouth daily.   Azelastine-Fluticasone (DYMISTA) 137-50 MCG/ACT SUSP Place 1 puff into the nose 2 (two) times daily.   clotrimazole-betamethasone (LOTRISONE) cream Apply 1 application. topically 2 (two) times daily.   fenofibrate (TRICOR) 145 MG tablet Take 1 tablet (145 mg total) by mouth daily.   losartan (COZAAR) 100 MG tablet Take 1 tablet (100 mg total) by mouth daily.   methocarbamol (ROBAXIN) 750 MG tablet TAKE ONE TABLET BY MOUTH TWICE A DAY   pantoprazole (PROTONIX) 40 MG tablet TAKE ONE TABLET BY MOUTH DAILY 30-60 MINUTES BEFORE FIRST MEAL OF THE DAY   promethazine (PHENERGAN) 25 MG tablet TAKE ONE TABLET BY MOUTH EVERY SIX HOURS AS NEEDED FOR NAUSEA AND VOMITING   sertraline (ZOLOFT) 100 MG tablet Take 2 tablets (200 mg total) by mouth daily.   SUMAtriptan (IMITREX) 100 MG tablet TAKE ONE TABLET BY MOUTH DAILY AS NEEDED   traZODone (DESYREL) 50 MG tablet Take 0.5-1 tablets (25-50 mg total) by  mouth at bedtime as needed for sleep.   triamcinolone (KENALOG) 0.1 % Apply 1 application topically 2 (two) times daily.   No facility-administered encounter medications on file as of 12/30/2021.    Allergies (verified) Patient has no known allergies.   History: Past Medical History:  Diagnosis Date   Acid reflux    Allergic rhinitis    Allergy    Anxiety    Depression    Hyperlipidemia    Hypertension    Testicular hypofunction    Past Surgical History:  Procedure Laterality Date   BASAL CELL CARCINOMA EXCISION     left side of face   CHOLECYSTECTOMY     COLONOSCOPY  04/30/2014   GASTRIC FUNDOPLICATION     INGUINAL HERNIA REPAIR     PARTIAL NEPHRECTOMY Left    VASECTOMY     Family History  Problem Relation Age of Onset   Cancer Maternal Grandfather 5       stomaCH   Hypertension Mother    Social History   Socioeconomic History   Marital status: Married    Spouse name: Not on file   Number of children: Not on file   Years of education: Not on file   Highest education level: Not on file  Occupational History   Not on file  Tobacco Use   Smoking status: Former    Packs/day: 1.00    Years: 3.00    Total pack years: 3.00    Types: Cigarettes  Quit date: 26    Years since quitting: 46.7   Smokeless tobacco: Never  Vaping Use   Vaping Use: Never used  Substance and Sexual Activity   Alcohol use: No   Drug use: No   Sexual activity: Yes  Other Topics Concern   Not on file  Social History Narrative   Not on file   Social Determinants of Health   Financial Resource Strain: Low Risk  (12/30/2021)   Overall Financial Resource Strain (CARDIA)    Difficulty of Paying Living Expenses: Not hard at all  Food Insecurity: No Food Insecurity (12/30/2021)   Hunger Vital Sign    Worried About Running Out of Food in the Last Year: Never true    Ran Out of Food in the Last Year: Never true  Transportation Needs: No Transportation Needs (12/30/2021)   PRAPARE -  Hydrologist (Medical): No    Lack of Transportation (Non-Medical): No  Physical Activity: Inactive (12/30/2021)   Exercise Vital Sign    Days of Exercise per Week: 0 days    Minutes of Exercise per Session: 0 min  Stress: No Stress Concern Present (12/30/2021)   [place]    Feeling of Stress : Not at all  Social Connections: Moderately Integrated (12/30/2021)   Social Connection and Isolation Panel [NHANES]    Frequency of Communication with Friends and Family: More than three times a week    Frequency of Social Gatherings with Friends and Family: Twice a week    Attends Religious Services: More than 4 times per year    Active Member of [employer] or Organizations: No    Attends Music therapist: Never    Marital Status: Married    Tobacco Counseling Counseling given: Not Answered   Clinical Intake:  Pre-visit preparation completed: Yes  Pain : No/denies pain  Diabetes: No  How often do you need to have someone help you when you read instructions, pamphlets, or other written materials from your doctor or pharmacy?: 1 - Never 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 0 0  Vision? 0 0  Difficulty concentrating or making decisions? 0 0  Walking or climbing stairs? 0 0  Dressing or bathing? 0 0  Doing errands, shopping? 0 0    Patient Care Team: Jon Billings, NP as PCP - General  Indicate any recent Medical Services you may have received from other than [facility] providers in the past year (date may be approximate).     Assessment:   This is a routine wellness examination for Chris Daugherty.  Hearing/Vision screen No results  found.  Dietary issues and exercise activities discussed:     Goals Addressed   None   Depression Screen    12/30/2021    3:57 PM 10/25/2021    9:47 AM 08/16/2021   10:44 AM 05/24/2021   10:45 AM 03/29/2021   10:47 AM 12/28/2020    5:06 PM 03/31/2020   10:03 AM  PHQ 2/9 Scores  PHQ - 2 Score 0 0 0 0 0 0 0  PHQ- 9 Score 0 0 0 0 1      Fall Risk  12/30/2021    3:58 PM 10/25/2021    9:47 AM 08/16/2021   10:44 AM 05/24/2021   10:44 AM 03/29/2021   10:47 AM  Fall Risk   Falls in the past year? 0 0 0 0 0  Number falls in past yr: 0 0 0 0 0  Injury with Fall? 0 0 0 0 0  Risk for fall due to : No Fall Risks No Fall Risks No Fall Risks No Fall Risks No Fall Risks  Follow up Falls evaluation completed Falls evaluation completed Falls evaluation completed Falls evaluation completed Falls evaluation completed    [place]:  Any stairs in or around the home? Yes  If so, are there any without handrails? No  Home free of loose throw rugs in walkways, pet beds, electrical cords, etc? Yes  Adequate lighting in your home to reduce risk of falls? Yes   ASSISTIVE DEVICES UTILIZED TO PREVENT FALLS:  Life alert? No  Use of a cane, walker or w/c? No  Grab bars in the bathroom? No  Shower chair or bench in shower? Yes  Elevated toilet seat or a handicapped toilet? No   TIMED UP AND GO:  Was the test performed? No .   Cognitive Function:        12/30/2021    3:59 PM 12/28/2020    5:11 PM  6CIT Screen  What Year? 0 points 0 points  What month? 0 points 0 points  What time? 0 points 0 points  Count back from 20 0 points 0 points  Months in reverse 0 points 0 points  Repeat phrase 0 points 0 points  Total Score 0 points 0 points    Immunizations Immunization History  Administered Date(s) Administered   Influenza, High Dose Seasonal PF 12/03/2019   Influenza,inj,Quad PF,6+ Mos 12/11/2014   Influenza-Unspecified 12/19/2016, 11/30/2017,  12/27/2018, 11/15/2020   PFIZER(Purple Top)SARS-COV-2 Vaccination 06/20/2019, 07/15/2019, 01/02/2020, 11/15/2020   PNEUMOCOCCAL CONJUGATE-20 10/25/2021   Pneumococcal Conjugate-13 05/26/2020   Tdap 11/11/2015, 05/30/2019   Zoster Recombinat (Shingrix) 01/24/2019, 04/02/2019   Zoster, Live 09/28/2015    TDAP status: Up to date  Flu Vaccine status: Due, Education has been provided regarding the importance of this vaccine. Advised may receive this vaccine at local pharmacy or Health Dept. Aware to provide a copy of the vaccination record if obtained from local pharmacy or Health Dept. Verbalized acceptance and understanding.  Pneumococcal vaccine status: Up to date  Covid-19 vaccine status: Completed vaccines  Qualifies for Shingles Vaccine? Yes   Zostavax completed Yes   Shingrix Completed?: Yes  Screening Tests Health Maintenance  Topic Date Due   COVID-19 Vaccine (5 - Pfizer risk series) 01/10/2021   INFLUENZA VACCINE  11/01/2021   COLONOSCOPY (Pts 45-31yr Insurance coverage will need to be confirmed)  04/30/2024   TETANUS/TDAP  05/29/2029   Pneumonia Vaccine 66 Years old  Completed   Hepatitis C Screening  Completed   Zoster Vaccines- Shingrix  Completed   HPV VACCINES  Aged Out    Health Maintenance  Health Maintenance Due  Topic Date Due   COVID-19 Vaccine (5 - Pfizer risk series) 01/10/2021   INFLUENZA VACCINE  11/01/2021    Colorectal cancer screening: Type of screening: Colonoscopy. Completed 04/30/14. Repeat every 10 years  Lung Cancer Screening: (Low Dose CT Chest recommended if Age 50103-80years, 30 pack-year currently smoking OR have quit w/in 15years.) does not qualify.    Additional Screening:  Hepatitis C Screening: does qualify;  Completed 03/31/20  Vision Screening: Recommended annual ophthalmology exams for early detection of glaucoma and other disorders of the eye. Is the patient up to date with their annual eye exam?  Yes  Who is the provider or  what is the name of the office in which the patient attends annual eye exams? [facility] If pt is not established with a provider, would they like to be referred to a provider to establish care? No .   Dental Screening: Recommended annual dental exams for proper oral hygiene  Community Resource Referral / Chronic Care Management: CRR required this visit?  No   CCM required this visit?  No      Plan:     I have personally reviewed and noted the following in the patient's chart:   Medical and social history Use of alcohol, tobacco or illicit drugs  Current medications and supplements including opioid prescriptions. Patient is not currently taking opioid prescriptions. Functional ability and status Nutritional status Physical activity Advanced directives List of other physicians Hospitalizations, surgeries, and ER visits in previous 12 months Vitals Screenings to include cognitive, depression, and falls Referrals and appointments  In addition, I have reviewed and discussed with patient certain preventive protocols, quality metrics, and best practice recommendations. A written personalized care plan for preventive services as well as general preventive health recommendations were provided to patient.     JOSHUAH MINELLA, [place]   12/30/2021   Nurse Notes: Non Face to Face 30 minutes    Mr. Ju , Thank you for taking time to come for your Medicare Wellness Visit. I appreciate your ongoing commitment to your health goals. Please review the following plan we discussed and let me know if I can assist you in the future.   These are the goals we discussed:  Goals   None     This is a list of the screening recommended for you and due dates:  Health Maintenance  Topic Date Due   COVID-19 Vaccine (5 - Pfizer risk series) 01/10/2021   Flu Shot  11/01/2021   Colon Cancer Screening  04/30/2024   Tetanus Vaccine  05/29/2029   Pneumonia Vaccine  Completed   Hepatitis  C Screening: USPSTF Recommendation to screen - Ages 18-79 yo.  Completed   Zoster (Shingles) Vaccine  Completed   HPV Vaccine  Aged Out

## 2021-12-30 NOTE — Patient Instructions (Signed)
Health Maintenance, Male Adopting a healthy lifestyle and getting preventive care are important in promoting health and wellness. Ask your health care provider about: The right schedule for you to have regular tests and exams. Things you can do on your own to prevent diseases and keep yourself healthy. What should I know about diet, weight, and exercise? Eat a healthy diet  Eat a diet that includes plenty of vegetables, fruits, low-fat dairy products, and lean protein. Do not eat a lot of foods that are high in solid fats, added sugars, or sodium. Maintain a healthy weight Body mass index (BMI) is a measurement that can be used to identify possible weight problems. It estimates body fat based on height and weight. Your health care provider can help determine your BMI and help you achieve or maintain a healthy weight. Get regular exercise Get regular exercise. This is one of the most important things you can do for your health. Most adults should: Exercise for at least 150 minutes each week. The exercise should increase your heart rate and make you sweat (moderate-intensity exercise). Do strengthening exercises at least twice a week. This is in addition to the moderate-intensity exercise. Spend less time sitting. Even light physical activity can be beneficial. Watch cholesterol and blood lipids Have your blood tested for lipids and cholesterol at 67 years of age, then have this test every 5 years. You may need to have your cholesterol levels checked more often if: Your lipid or cholesterol levels are high. You are older than 67 years of age. You are at high risk for heart disease. What should I know about cancer screening? Many types of cancers can be detected early and may often be prevented. Depending on your health history and family history, you may need to have cancer screening at various ages. This may include screening for: Colorectal cancer. Prostate cancer. Skin cancer. Lung  cancer. What should I know about heart disease, diabetes, and high blood pressure? Blood pressure and heart disease High blood pressure causes heart disease and increases the risk of stroke. This is more likely to develop in people who have high blood pressure readings or are overweight. Talk with your health care provider about your target blood pressure readings. Have your blood pressure checked: Every 3-5 years if you are 18-39 years of age. Every year if you are 40 years old or older. If you are between the ages of 65 and 75 and are a current or former smoker, ask your health care provider if you should have a one-time screening for abdominal aortic aneurysm (AAA). Diabetes Have regular diabetes screenings. This checks your fasting blood sugar level. Have the screening done: Once every three years after age 45 if you are at a normal weight and have a low risk for diabetes. More often and at a younger age if you are overweight or have a high risk for diabetes. What should I know about preventing infection? Hepatitis B If you have a higher risk for hepatitis B, you should be screened for this virus. Talk with your health care provider to find out if you are at risk for hepatitis B infection. Hepatitis C Blood testing is recommended for: Everyone born from 1945 through 1965. Anyone with known risk factors for hepatitis C. Sexually transmitted infections (STIs) You should be screened each year for STIs, including gonorrhea and chlamydia, if: You are sexually active and are younger than 67 years of age. You are older than 67 years of age and your   health care provider tells you that you are at risk for this type of infection. Your sexual activity has changed since you were last screened, and you are at increased risk for chlamydia or gonorrhea. Ask your health care provider if you are at risk. Ask your health care provider about whether you are at high risk for HIV. Your health care provider  may recommend a prescription medicine to help prevent HIV infection. If you choose to take medicine to prevent HIV, you should first get tested for HIV. You should then be tested every 3 months for as long as you are taking the medicine. Follow these instructions at home: Alcohol use Do not drink alcohol if your health care provider tells you not to drink. If you drink alcohol: Limit how much you have to 0-2 drinks a day. Know how much alcohol is in your drink. In the [place] one drink equals one 12 oz bottle of beer (355 mL), one 5 oz glass of wine (148 mL), or one 1 oz glass of hard liquor (44 mL). Lifestyle Do not use any products that contain nicotine or tobacco. These products include cigarettes, chewing tobacco, and vaping devices, such as e-cigarettes. If you need help quitting, ask your health care provider. Do not use street drugs. Do not share needles. Ask your health care provider for help if you need support or information about quitting drugs. General instructions Schedule regular health, dental, and eye exams. Stay current with your vaccines. Tell your health care provider if: You often feel depressed. You have ever been abused or do not feel safe at home. Summary Adopting a healthy lifestyle and getting preventive care are important in promoting health and wellness. Follow your health care provider's instructions about healthy diet, exercising, and getting tested or screened for diseases. Follow your health care provider's instructions on monitoring your cholesterol and blood pressure. This information is not intended to replace advice given to you by your health care provider. Make sure you discuss any questions you have with your health care provider. Document Revised: 08/09/2020 Document Reviewed: 08/09/2020 Elsevier Patient Education  2023 [employer]  

## 2022-01-17 DIAGNOSIS — M65332 Trigger finger, left middle finger: Secondary | ICD-10-CM | POA: Diagnosis not present

## 2022-02-12 DIAGNOSIS — M7022 Olecranon bursitis, left elbow: Secondary | ICD-10-CM | POA: Diagnosis not present

## 2022-02-12 DIAGNOSIS — L301 Dyshidrosis [pompholyx]: Secondary | ICD-10-CM | POA: Diagnosis not present

## 2022-02-18 DIAGNOSIS — M71122 Other infective bursitis, left elbow: Secondary | ICD-10-CM | POA: Diagnosis not present

## 2022-02-22 DIAGNOSIS — M71122 Other infective bursitis, left elbow: Secondary | ICD-10-CM | POA: Diagnosis not present

## 2022-02-27 DIAGNOSIS — M7022 Olecranon bursitis, left elbow: Secondary | ICD-10-CM | POA: Diagnosis not present

## 2022-03-22 DIAGNOSIS — M7022 Olecranon bursitis, left elbow: Secondary | ICD-10-CM | POA: Diagnosis not present

## 2022-03-22 DIAGNOSIS — M6530 Trigger finger, unspecified finger: Secondary | ICD-10-CM | POA: Diagnosis not present

## 2022-03-29 ENCOUNTER — Other Ambulatory Visit: Payer: Self-pay | Admitting: Internal Medicine

## 2022-03-29 DIAGNOSIS — R058 Other specified cough: Secondary | ICD-10-CM

## 2022-03-30 DIAGNOSIS — M65332 Trigger finger, left middle finger: Secondary | ICD-10-CM | POA: Diagnosis not present

## 2022-04-20 ENCOUNTER — Other Ambulatory Visit: Payer: Self-pay | Admitting: Nurse Practitioner

## 2022-04-26 NOTE — Progress Notes (Unsigned)
There were no vitals taken for this visit.   Subjective:    Patient ID: Chris Daugherty, male    DOB: 04-Sep-1955, 67 y.o.   MRN: [id number]  HPI: Chris Daugherty is a 67 y.o. male  No chief complaint on file.  HYPERTENSION / HYPERLIPIDEMIA Satisfied with current treatment? no Duration of hypertension: years BP monitoring frequency: not checking BP range:  BP medication side effects: no Past BP meds: amlodipine Duration of hyperlipidemia: years Cholesterol medication side effects: no Cholesterol supplements: none Past cholesterol medications: fenofibrate (tricor) Medication compliance: excellent compliance Aspirin: no Recent stressors: no Recurrent headaches: no Visual changes: no Palpitations: no Dyspnea: no Chest pain: no Lower extremity edema: no Dizzy/lightheaded: no  CHRONIC KIDNEY DISEASE CKD status: {Blank single:19197::"controlled","uncontrolled","better","worse","exacerbated","stable"} Medications renally dose: {Blank single:19197::"yes","no"} Previous renal evaluation: {Blank single:19197::"yes","no"} Pneumovax:  {Blank single:19197::"Up to Date","Not up to Date","unknown"} Influenza Vaccine:  {Blank single:19197::"Up to Date","Not up to Date","unknown"}  ANXIETY Patient states his anxiety is much better.  The Zoloft is helping a lot.  Denies SI. He is waking up several times at night.  Would like to try something for sleep.    Flowsheet Row Clinical Support from 12/30/2021 in [facility]  PHQ-9 Total Score 0          10/25/2021    9:47 AM 08/16/2021   10:44 AM 05/24/2021   10:45 AM 03/18/2019    8:26 AM  GAD 7 : Generalized Anxiety Score  Nervous, Anxious, on Edge 0 0 0 0  Control/stop worrying 0 0 0 0  Worry too much - different things 0 0 0 0  Trouble relaxing 0 0 0 0  Restless 0 0 0 0  Easily annoyed or irritable 0 0 0 0  Afraid - awful might happen 0 0 0 0  Total GAD 7 Score 0 0 0 0  Anxiety Difficulty Not difficult at  all Not difficult at all Not difficult at all       Relevant past medical, surgical, family and social history reviewed and updated as indicated. Interim medical history since our last visit reviewed. Allergies and medications reviewed and updated.  Review of Systems  Eyes:  Negative for visual disturbance.  Respiratory:  Negative for chest tightness and shortness of breath.   Cardiovascular:  Negative for chest pain, palpitations and leg swelling.  Neurological:  Negative for dizziness, light-headedness and headaches.  Psychiatric/Behavioral:  Positive for sleep disturbance. Negative for dysphoric mood and suicidal ideas. The patient is not nervous/anxious.     Per HPI unless specifically indicated above     Objective:    There were no vitals taken for this visit.  Wt Readings from Last 3 Encounters:  10/25/21 202 lb 9.6 oz (91.9 kg)  08/16/21 203 lb 9.6 oz (92.4 kg)  05/24/21 205 lb 9.6 oz (93.3 kg)    Physical Exam Vitals and nursing note reviewed.  Constitutional:      General: He is not in acute distress.    Appearance: Normal appearance. He is not ill-appearing, toxic-appearing or diaphoretic.  HENT:     Head: Normocephalic.     Right Ear: External ear normal.     Left Ear: External ear normal.     Nose: Nose normal. No congestion or rhinorrhea.     Mouth/Throat:     Mouth: Mucous membranes are moist.  Eyes:     General:        Right eye: No discharge.  Left eye: No discharge.     Extraocular Movements: Extraocular movements intact.     Conjunctiva/sclera: Conjunctivae normal.     Pupils: Pupils are equal, round, and reactive to light.  Cardiovascular:     Rate and Rhythm: Normal rate and regular rhythm.     Heart sounds: No murmur heard. Pulmonary:     Effort: Pulmonary effort is normal. No respiratory distress.     Breath sounds: Normal breath sounds. No wheezing, rhonchi or rales.  Abdominal:     General: Abdomen is flat. Bowel sounds are normal.   Musculoskeletal:     Cervical back: Normal range of motion and neck supple.  Skin:    General: Skin is warm and dry.     Capillary Refill: Capillary refill takes less than 2 seconds.  Neurological:     General: No focal deficit present.     Mental Status: He is alert and oriented to person, place, and time.  Psychiatric:        Mood and Affect: Mood normal.        Behavior: Behavior normal.        Thought Content: Thought content normal.        Judgment: Judgment normal.     Results for orders placed or performed in visit on 10/25/21  TSH  Result Value Ref Range   TSH 4.830 (H) 0.450 - 4.500 uIU/mL  PSA  Result Value Ref Range   Prostate Specific Ag, Serum 0.5 0.0 - 4.0 ng/mL  Lipid panel  Result Value Ref Range   Cholesterol, Total 178 100 - 199 mg/dL   Triglycerides 209 (H) 0 - 149 mg/dL   HDL 35 (L) >39 mg/dL   VLDL Cholesterol Cal 36 5 - 40 mg/dL   LDL Chol Calc (NIH) 107 (H) 0 - 99 mg/dL   Chol/HDL Ratio 5.1 (H) 0.0 - 5.0 ratio  CBC with Differential/Platelet  Result Value Ref Range   WBC 2.6 (L) 3.4 - 10.8 x10E3/uL   RBC 4.54 4.14 - 5.80 x10E6/uL   Hemoglobin 12.8 (L) 13.0 - 17.7 g/dL   Hematocrit 37.7 37.5 - 51.0 %   MCV 83 79 - 97 fL   MCH 28.2 26.6 - 33.0 pg   MCHC 34.0 31.5 - 35.7 g/dL   RDW 13.9 11.6 - 15.4 %   Platelets 134 (L) 150 - 450 x10E3/uL   Neutrophils 59 Not Estab. %   Lymphs 26 Not Estab. %   Monocytes 10 Not Estab. %   Eos 4 Not Estab. %   Basos 1 Not Estab. %   Neutrophils Absolute 1.5 1.4 - 7.0 x10E3/uL   Lymphocytes Absolute 0.7 0.7 - 3.1 x10E3/uL   Monocytes Absolute 0.3 0.1 - 0.9 x10E3/uL   EOS (ABSOLUTE) 0.1 0.0 - 0.4 x10E3/uL   Basophils Absolute 0.0 0.0 - 0.2 x10E3/uL   Immature Granulocytes 0 Not Estab. %   Immature Grans (Abs) 0.0 0.0 - 0.1 x10E3/uL  Comprehensive metabolic panel  Result Value Ref Range   Glucose 98 70 - 99 mg/dL   BUN 25 8 - 27 mg/dL   Creatinine, Ser 1.45 (H) 0.76 - 1.27 mg/dL   eGFR 53 (L) >59  mL/min/1.73   BUN/Creatinine Ratio 17 10 - 24   Sodium 139 134 - 144 mmol/L   Potassium 4.2 3.5 - 5.2 mmol/L   Chloride 101 96 - 106 mmol/L   CO2 23 20 - 29 mmol/L   Calcium 9.6 8.6 - 10.2 mg/dL   Total Protein  6.6 6.0 - 8.5 g/dL   Albumin 4.7 3.9 - 4.9 g/dL   Globulin, Total 1.9 1.5 - 4.5 g/dL   Albumin/Globulin Ratio 2.5 (H) 1.2 - 2.2   Bilirubin Total 0.3 0.0 - 1.2 mg/dL   Alkaline Phosphatase 52 44 - 121 IU/L   AST 24 0 - 40 IU/L   ALT 16 0 - 44 IU/L  Urinalysis, Routine w reflex microscopic  Result Value Ref Range   Specific Gravity, UA 1.025 1.005 - 1.030   pH, UA 5.5 5.0 - 7.5   Color, UA Yellow Yellow   Appearance Ur Clear Clear   Leukocytes,UA Negative Negative   Protein,UA Negative Negative/Trace   Glucose, UA Negative Negative   Ketones, UA Negative Negative   RBC, UA Negative Negative   Bilirubin, UA Negative Negative   Urobilinogen, Ur 0.2 0.2 - 1.0 mg/dL   Nitrite, UA Negative Negative      Assessment & Plan:   Problem List Items Addressed This Visit       Cardiovascular and Mediastinum   Essential (primary) hypertension - Primary     Genitourinary   Hypertensive renal disease   Stage 3b chronic kidney disease (HCC)     Other   Anxiety   Hyperlipidemia   Elevated blood sugar     Follow up plan: No follow-ups on file.

## 2022-04-27 ENCOUNTER — Encounter: Payer: Self-pay | Admitting: Nurse Practitioner

## 2022-04-27 ENCOUNTER — Ambulatory Visit (INDEPENDENT_AMBULATORY_CARE_PROVIDER_SITE_OTHER): Payer: Medicare Other | Admitting: Nurse Practitioner

## 2022-04-27 VITALS — BP 123/73 | HR 93 | Temp 99.3°F | Wt 217.7 lb

## 2022-04-27 DIAGNOSIS — N1832 Chronic kidney disease, stage 3b: Secondary | ICD-10-CM | POA: Diagnosis not present

## 2022-04-27 DIAGNOSIS — I10 Essential (primary) hypertension: Secondary | ICD-10-CM | POA: Diagnosis not present

## 2022-04-27 DIAGNOSIS — F5101 Primary insomnia: Secondary | ICD-10-CM | POA: Diagnosis not present

## 2022-04-27 DIAGNOSIS — R739 Hyperglycemia, unspecified: Secondary | ICD-10-CM

## 2022-04-27 DIAGNOSIS — F419 Anxiety disorder, unspecified: Secondary | ICD-10-CM | POA: Diagnosis not present

## 2022-04-27 DIAGNOSIS — E785 Hyperlipidemia, unspecified: Secondary | ICD-10-CM | POA: Diagnosis not present

## 2022-04-27 DIAGNOSIS — I129 Hypertensive chronic kidney disease with stage 1 through stage 4 chronic kidney disease, or unspecified chronic kidney disease: Secondary | ICD-10-CM

## 2022-04-27 MED ORDER — TRAZODONE HCL 50 MG PO TABS: 25.0000 mg | ORAL_TABLET | Freq: Every evening | ORAL | 1 refills | Status: DC | PRN

## 2022-04-27 MED ORDER — FENOFIBRATE 145 MG PO TABS: 145.0000 mg | ORAL_TABLET | Freq: Every day | ORAL | 1 refills | Status: DC

## 2022-04-27 MED ORDER — AMLODIPINE BESYLATE 10 MG PO TABS: 10.0000 mg | ORAL_TABLET | Freq: Every day | ORAL | 1 refills | Status: DC

## 2022-04-27 MED ORDER — SERTRALINE HCL 100 MG PO TABS: 200.0000 mg | ORAL_TABLET | Freq: Every day | ORAL | 1 refills | Status: DC

## 2022-04-27 MED ORDER — LOSARTAN POTASSIUM 100 MG PO TABS: 100.0000 mg | ORAL_TABLET | Freq: Every day | ORAL | 1 refills | Status: DC

## 2022-04-27 MED ORDER — PANTOPRAZOLE SODIUM 40 MG PO TBEC: DELAYED_RELEASE_TABLET | ORAL | 1 refills | Status: DC

## 2022-04-27 NOTE — Assessment & Plan Note (Signed)
Chronic.  Controlled.  Continue with current medication regimen of Trazodone '50mg'$ . Refills sent today.  Labs ordered today.  Return to clinic in 6 months for reevaluation.  Call sooner if concerns arise.

## 2022-04-27 NOTE — Assessment & Plan Note (Signed)
Chronic.  Controlled.  Continue with current medication regimen.  Labs ordered today.  Return to clinic in 6 months for reevaluation.  Call sooner if concerns arise.  ? ?

## 2022-04-27 NOTE — Assessment & Plan Note (Signed)
Chronic.  Controlled.  Continue with current medication regimen of Amlodipine '10mg'$  daily and Losartan '100mg'$  daily.  Refills sent today.  Labs ordered today.  Recommend checking blood pressures at home.  Return to clinic in 6 months for reevaluation.  Call sooner if concerns arise.

## 2022-04-27 NOTE — Assessment & Plan Note (Signed)
Chronic.  Controlled.  Continue with current medication regimen of Zoloft '200mg'$  daily.  Refills sent today.  Labs ordered today.  Return to clinic in 6 months for reevaluation.  Call sooner if concerns arise.

## 2022-04-27 NOTE — Assessment & Plan Note (Signed)
Chronic.  Controlled.  Continue with current medication regimen on Fenofibrate.  Refills sent today.  Labs ordered today.  Return to clinic in 6 months for reevaluation.  Call sooner if concerns arise.

## 2022-04-27 NOTE — Assessment & Plan Note (Signed)
Chronic, stable. Checking CMP today. Will treat and adjust regimen based on results.  Will keep blood pressure under control.

## 2022-04-27 NOTE — Assessment & Plan Note (Signed)
Labs ordered at visit today.  Will make recommendations based on lab results.   

## 2022-04-28 LAB — LIPID PANEL
Chol/HDL Ratio: 4.1 ratio (ref 0.0–5.0)
Cholesterol, Total: 181 mg/dL (ref 100–199)
HDL: 44 mg/dL (ref >39)
LDL Chol Calc (NIH): 101 mg/dL — ABNORMAL HIGH (ref 0–99)
Triglycerides: 211 mg/dL — ABNORMAL HIGH (ref 0–149)
VLDL Cholesterol Cal: 36 mg/dL (ref 5–40)

## 2022-04-28 LAB — HEMOGLOBIN A1C
Est. average glucose Bld gHb Est-mCnc: 111 mg/dL
Hgb A1c MFr Bld: 5.5 % (ref 4.8–5.6)

## 2022-04-28 LAB — CBC WITH DIFFERENTIAL/PLATELET
Basophils Absolute: 0 10*3/uL (ref 0.0–0.2)
Basos: 1 %
EOS (ABSOLUTE): 0.1 10*3/uL (ref 0.0–0.4)
Eos: 3 %
Hematocrit: 37.4 % — ABNORMAL LOW (ref 37.5–51.0)
Hemoglobin: 12.3 g/dL — ABNORMAL LOW (ref 13.0–17.7)
Immature Grans (Abs): 0 10*3/uL (ref 0.0–0.1)
Immature Granulocytes: 1 %
Lymphocytes Absolute: 0.8 10*3/uL (ref 0.7–3.1)
Lymphs: 23 %
MCH: 26.9 pg (ref 26.6–33.0)
MCHC: 32.9 g/dL (ref 31.5–35.7)
MCV: 82 fL (ref 79–97)
Monocytes Absolute: 0.3 10*3/uL (ref 0.1–0.9)
Monocytes: 10 %
Neutrophils Absolute: 2.2 10*3/uL (ref 1.4–7.0)
Neutrophils: 62 %
Platelets: 122 10*3/uL — ABNORMAL LOW (ref 150–450)
RBC: 4.58 x10E6/uL (ref 4.14–5.80)
RDW: 14.6 % (ref 11.6–15.4)
WBC: 3.5 10*3/uL (ref 3.4–10.8)

## 2022-04-28 LAB — COMPREHENSIVE METABOLIC PANEL
ALT: 20 IU/L (ref 0–44)
AST: 19 IU/L (ref 0–40)
Albumin/Globulin Ratio: 2.7 — ABNORMAL HIGH (ref 1.2–2.2)
Albumin: 4.6 g/dL (ref 3.9–4.9)
Alkaline Phosphatase: 52 IU/L (ref 44–121)
BUN/Creatinine Ratio: 15 (ref 10–24)
BUN: 20 mg/dL (ref 8–27)
Bilirubin Total: <0.2 mg/dL (ref 0.0–1.2)
CO2: 24 mmol/L (ref 20–29)
Calcium: 9 mg/dL (ref 8.6–10.2)
Chloride: 101 mmol/L (ref 96–106)
Creatinine, Ser: 1.36 mg/dL — ABNORMAL HIGH (ref 0.76–1.27)
Globulin, Total: 1.7 g/dL (ref 1.5–4.5)
Glucose: 97 mg/dL (ref 70–99)
Potassium: 4 mmol/L (ref 3.5–5.2)
Sodium: 139 mmol/L (ref 134–144)
Total Protein: 6.3 g/dL (ref 6.0–8.5)
eGFR: 57 mL/min/{1.73_m2} — ABNORMAL LOW (ref >59)

## 2022-04-28 NOTE — Progress Notes (Signed)
HI Mr. Eriksson. It was nice to see you yesterday.  Your lab work looks good.  Your kidney function remains stable.  Cholesterol is relatively the same as prior.  Continue with a low fat diet and exercise.  Your A1c remains in the normal range which is great.  No concerns at this time. Continue with your current medication regimen.  Follow up as discussed.  Please let me know if you have any questions.

## 2022-05-03 DIAGNOSIS — D2262 Melanocytic nevi of left upper limb, including shoulder: Secondary | ICD-10-CM | POA: Diagnosis not present

## 2022-05-03 DIAGNOSIS — D485 Neoplasm of uncertain behavior of skin: Secondary | ICD-10-CM | POA: Diagnosis not present

## 2022-05-03 DIAGNOSIS — D0439 Carcinoma in situ of skin of other parts of face: Secondary | ICD-10-CM | POA: Diagnosis not present

## 2022-05-03 DIAGNOSIS — Z85828 Personal history of other malignant neoplasm of skin: Secondary | ICD-10-CM | POA: Diagnosis not present

## 2022-05-03 DIAGNOSIS — L570 Actinic keratosis: Secondary | ICD-10-CM | POA: Diagnosis not present

## 2022-05-03 DIAGNOSIS — L300 Nummular dermatitis: Secondary | ICD-10-CM | POA: Diagnosis not present

## 2022-05-03 DIAGNOSIS — D0461 Carcinoma in situ of skin of right upper limb, including shoulder: Secondary | ICD-10-CM | POA: Diagnosis not present

## 2022-05-03 DIAGNOSIS — X32XXXA Exposure to sunlight, initial encounter: Secondary | ICD-10-CM | POA: Diagnosis not present

## 2022-05-03 DIAGNOSIS — D2261 Melanocytic nevi of right upper limb, including shoulder: Secondary | ICD-10-CM | POA: Diagnosis not present

## 2022-05-03 DIAGNOSIS — D225 Melanocytic nevi of trunk: Secondary | ICD-10-CM | POA: Diagnosis not present

## 2022-05-24 DIAGNOSIS — Z03818 Encounter for observation for suspected exposure to other biological agents ruled out: Secondary | ICD-10-CM | POA: Diagnosis not present

## 2022-05-24 DIAGNOSIS — J0140 Acute pansinusitis, unspecified: Secondary | ICD-10-CM | POA: Diagnosis not present

## 2022-06-19 DIAGNOSIS — D0439 Carcinoma in situ of skin of other parts of face: Secondary | ICD-10-CM | POA: Diagnosis not present

## 2022-06-26 DIAGNOSIS — D0439 Carcinoma in situ of skin of other parts of face: Secondary | ICD-10-CM | POA: Diagnosis not present

## 2022-06-26 DIAGNOSIS — D0461 Carcinoma in situ of skin of right upper limb, including shoulder: Secondary | ICD-10-CM | POA: Diagnosis not present

## 2022-07-04 DIAGNOSIS — M65332 Trigger finger, left middle finger: Secondary | ICD-10-CM | POA: Diagnosis not present

## 2022-07-17 ENCOUNTER — Other Ambulatory Visit: Payer: Self-pay | Admitting: Nurse Practitioner

## 2022-07-17 ENCOUNTER — Other Ambulatory Visit: Payer: Self-pay

## 2022-07-17 DIAGNOSIS — Z8669 Personal history of other diseases of the nervous system and sense organs: Secondary | ICD-10-CM

## 2022-07-17 MED ORDER — PROMETHAZINE HCL 25 MG PO TABS
ORAL_TABLET | ORAL | 1 refills | Status: AC
Start: 2022-07-17 — End: ?

## 2022-07-17 NOTE — Telephone Encounter (Signed)
Medication Refill - Medication: methocarbamol (ROBAXIN) 750 MG tablet [[id number]]   Has the patient contacted their pharmacy? Yes.   (Agent: If no, request that the patient contact the pharmacy for the refill. If patient does not wish to contact the pharmacy document the reason why and proceed with request.) (Agent: If yes, when and what did the pharmacy advise?)  Preferred Pharmacy (with phone number or street name):  Has the patient been seen for an appointment in the last year OR does the patient have an upcoming appointment? Yes.   [facility] [id number] [address] [place], [place] - [address] [place], [place] [address] Phone: [phone]  Fax: [phone]   Agent: Please be advised that RX refills may take up to 3 business days. We ask that you follow-up with your pharmacy.

## 2022-07-18 NOTE — Telephone Encounter (Signed)
Requested medication (s) are due for refill today:Yes  Requested medication (s) are on the active medication list:Yes  Last refill:  03/11/21  Future visit scheduled: Yes  Notes to clinic:  Unable to refill per protocol, cannot delegate.      Requested Prescriptions  Pending Prescriptions Disp Refills   methocarbamol (ROBAXIN) 750 MG tablet 180 tablet 0    Sig: Take 1 tablet (750 mg total) by mouth 2 (two) times daily.     Not Delegated - Analgesics:  Muscle Relaxants Failed - 07/17/2022  3:48 PM      Failed - This refill cannot be delegated      Passed - Valid encounter within last 6 months    Recent Outpatient Visits           2 months ago Essential (primary) hypertension   [place] [facility] Larae Grooms, NP   8 months ago Annual physical exam   [place] [facility] Larae Grooms, NP   11 months ago Psoriasis   [place] [facility] Larae Grooms, NP   1 year ago Psoriasis   [place] [facility] Gabriel Cirri, NP   1 year ago Stage 3b chronic kidney disease [facility])   [place] [facility] Larae Grooms, NP       Future Appointments             In 3 months Mecum, Oswaldo Conroy, PA-C [place] [facility], [facility]

## 2022-07-19 MED ORDER — METHOCARBAMOL 750 MG PO TABS
750.0000 mg | ORAL_TABLET | Freq: Two times a day (BID) | ORAL | 0 refills | Status: DC
Start: 2022-07-19 — End: 2024-01-04

## 2022-09-19 DIAGNOSIS — H52223 Regular astigmatism, bilateral: Secondary | ICD-10-CM | POA: Diagnosis not present

## 2022-09-19 DIAGNOSIS — H59811 Chorioretinal scars after surgery for detachment, right eye: Secondary | ICD-10-CM | POA: Diagnosis not present

## 2022-09-19 DIAGNOSIS — H524 Presbyopia: Secondary | ICD-10-CM | POA: Diagnosis not present

## 2022-09-19 DIAGNOSIS — H269 Unspecified cataract: Secondary | ICD-10-CM | POA: Diagnosis not present

## 2022-10-17 ENCOUNTER — Other Ambulatory Visit: Payer: Self-pay | Admitting: Nurse Practitioner

## 2022-10-18 NOTE — Telephone Encounter (Signed)
Requested Prescriptions  Pending Prescriptions Disp Refills   pantoprazole (PROTONIX) 40 MG tablet [Pharmacy Med Name: PANTOPRAZOLE SOD DR 40 MG TAB] 90 tablet 1    Sig: TAKE 1 TABLET BY MOUTH DAILY 30-60 MINUTES BEFORE FIRST MEAL OF THE DAY     Gastroenterology: Proton Pump Inhibitors Passed - 10/17/2022  6:22 AM      Passed - Valid encounter within last 12 months    Recent Outpatient Visits           5 months ago Essential (primary) hypertension   [place] [facility] Larae Grooms, NP   11 months ago Annual physical exam   [place] [facility] Larae Grooms, NP   1 year ago Psoriasis   [place] [facility] Larae Grooms, NP   1 year ago Psoriasis   [place] [facility] Gabriel Cirri, NP   1 year ago Stage 3b chronic kidney disease [facility])   [place] [facility] Larae Grooms, NP       Future Appointments             In 1 week Pearley, Sherran Needs, NP [place] [facility], [facility]             sertraline (ZOLOFT) 100 MG tablet [Pharmacy Med Name: SERTRALINE HCL 100 MG TABLET] 180 tablet 1    Sig: TAKE 2 TABLETS BY MOUTH DAILY     Psychiatry:  Antidepressants - SSRI - sertraline Passed - 10/17/2022  6:22 AM      Passed - AST in normal range and within 360 days    AST  Date Value Ref Range Status  04/27/2022 19 0 - 40 IU/L Final         Passed - ALT in normal range and within 360 days    ALT  Date Value Ref Range Status  04/27/2022 20 0 - 44 IU/L Final         Passed - Completed PHQ-2 or PHQ-9 in the last 360 days      Passed - Valid encounter within last 6 months    Recent Outpatient Visits           5 months ago Essential (primary) hypertension   [place] [facility] Larae Grooms, NP   11 months ago Annual physical exam   [place] [facility] Larae Grooms, NP   1 year ago Psoriasis   [place] [facility] Larae Grooms, NP   1 year ago Psoriasis   [place] [facility] Gabriel Cirri, NP   1 year ago Stage 3b chronic kidney disease [facility])   [place] [facility] Larae Grooms, NP       Future Appointments             In 1 week Pearley, Sherran Needs, NP [place] [facility], [facility]

## 2022-10-27 ENCOUNTER — Encounter: Payer: Medicare Other | Admitting: Family Medicine

## 2022-10-31 ENCOUNTER — Other Ambulatory Visit: Payer: Self-pay | Admitting: Nurse Practitioner

## 2022-10-31 DIAGNOSIS — E785 Hyperlipidemia, unspecified: Secondary | ICD-10-CM

## 2022-10-31 NOTE — Telephone Encounter (Signed)
Requested Prescriptions  Pending Prescriptions Disp Refills   fenofibrate (TRICOR) 145 MG tablet [Pharmacy Med Name: FENOFIBRATE 145 MG TABLET] 90 tablet 1    Sig: TAKE 1 TABLET BY MOUTH DAILY     Cardiovascular:  Antilipid - Fibric Acid Derivatives Failed - 10/31/2022  6:21 AM      Failed - Cr in normal range and within 360 days    Creatinine, Ser  Date Value Ref Range Status  04/27/2022 1.36 (H) 0.76 - 1.27 mg/dL Final         Failed - HGB in normal range and within 360 days    Hemoglobin  Date Value Ref Range Status  04/27/2022 12.3 (L) 13.0 - 17.7 g/dL Final         Failed - HCT in normal range and within 360 days    Hematocrit  Date Value Ref Range Status  04/27/2022 37.4 (L) 37.5 - 51.0 % Final         Failed - PLT in normal range and within 360 days    Platelets  Date Value Ref Range Status  04/27/2022 122 (L) 150 - 450 x10E3/uL Final         Failed - Lipid Panel in normal range within the last 12 months    Cholesterol, Total  Date Value Ref Range Status  04/27/2022 181 100 - 199 mg/dL Final   LDL Chol Calc (NIH)  Date Value Ref Range Status  04/27/2022 101 (H) 0 - 99 mg/dL Final   HDL  Date Value Ref Range Status  04/27/2022 44 >39 mg/dL Final   Triglycerides  Date Value Ref Range Status  04/27/2022 211 (H) 0 - 149 mg/dL Final         Passed - ALT in normal range and within 360 days    ALT  Date Value Ref Range Status  04/27/2022 20 0 - 44 IU/L Final         Passed - AST in normal range and within 360 days    AST  Date Value Ref Range Status  04/27/2022 19 0 - 40 IU/L Final         Passed - WBC in normal range and within 360 days    WBC  Date Value Ref Range Status  04/27/2022 3.5 3.4 - 10.8 x10E3/uL Final  12/14/2020 4.6 4.0 - 10.5 K/uL Final         Passed - eGFR is 30 or above and within 360 days    GFR calc Af Amer  Date Value Ref Range Status  03/31/2020 66 >59 mL/min/1.73 Final    Comment:    **In accordance with recommendations  from the NKF-ASN Task force,**   [facility] is in the process of updating its eGFR calculation to the   2021 CKD-EPI creatinine equation that estimates kidney function   without a race variable.    GFR calc non Af Amer  Date Value Ref Range Status  03/31/2020 57 (L) >59 mL/min/1.73 Final   eGFR  Date Value Ref Range Status  04/27/2022 57 (L) >59 mL/min/1.73 Final         Passed - Valid encounter within last 12 months    Recent Outpatient Visits           6 months ago Essential (primary) hypertension   [place] [facility] Larae Grooms, NP   1 year ago Annual physical exam   [place] [facility] Larae Grooms, NP   1 year ago Psoriasis  [place] [facility] Larae Grooms, NP   1 year ago Psoriasis   [place] [facility] Gabriel Cirri, NP   1 year ago Stage 3b chronic kidney disease [facility])   [place] [facility] Larae Grooms, NP       Future Appointments             In 1 week Mecum, Oswaldo Conroy, PA-C [place] [facility], [facility]

## 2022-11-07 ENCOUNTER — Encounter: Payer: Self-pay | Admitting: Physician Assistant

## 2022-11-07 ENCOUNTER — Ambulatory Visit (INDEPENDENT_AMBULATORY_CARE_PROVIDER_SITE_OTHER): Payer: Medicare Other | Admitting: Physician Assistant

## 2022-11-07 VITALS — BP 118/76 | HR 88 | Ht 72.0 in | Wt 206.6 lb

## 2022-11-07 DIAGNOSIS — R202 Paresthesia of skin: Secondary | ICD-10-CM

## 2022-11-07 DIAGNOSIS — Z0000 Encounter for general adult medical examination without abnormal findings: Secondary | ICD-10-CM

## 2022-11-07 DIAGNOSIS — I10 Essential (primary) hypertension: Secondary | ICD-10-CM

## 2022-11-07 DIAGNOSIS — F5101 Primary insomnia: Secondary | ICD-10-CM

## 2022-11-07 DIAGNOSIS — Z87891 Personal history of nicotine dependence: Secondary | ICD-10-CM

## 2022-11-07 DIAGNOSIS — Z7189 Other specified counseling: Secondary | ICD-10-CM | POA: Diagnosis not present

## 2022-11-07 MED ORDER — LOSARTAN POTASSIUM 100 MG PO TABS
100.0000 mg | ORAL_TABLET | Freq: Every day | ORAL | 1 refills | Status: DC
Start: 2022-11-07 — End: 2023-05-17

## 2022-11-07 MED ORDER — AMLODIPINE BESYLATE 10 MG PO TABS
10.0000 mg | ORAL_TABLET | Freq: Every day | ORAL | 1 refills | Status: DC
Start: 2022-11-07 — End: 2023-05-31

## 2022-11-07 MED ORDER — TRAZODONE HCL 50 MG PO TABS
50.0000 mg | ORAL_TABLET | Freq: Every evening | ORAL | 1 refills | Status: DC | PRN
Start: 2022-11-07 — End: 2023-02-02

## 2022-11-07 MED ORDER — TRAZODONE HCL 50 MG PO TABS
25.0000 mg | ORAL_TABLET | Freq: Every evening | ORAL | 1 refills | Status: DC | PRN
Start: 2022-11-07 — End: 2022-11-07

## 2022-11-07 NOTE — Progress Notes (Unsigned)
Annual Physical Exam   Name: Chris Daugherty   MRN: [id number]    DOB: [birth year]   Date:11/08/2022  Today's Provider: Jacquelin Hawking, MHS, PA-C Introduced myself to the patient as a PA-C and provided education on APPs in clinical practice.         Subjective  Chief Complaint  Chief Complaint  Patient presents with   Annual Exam    HPI  Patient presents for annual CPE .   Diet: overall normal dietary intake  Exercise: He is not engaged in regular exercise but stays active  Sleep:Reports some difficulty going to sleep and wakes up 2-3 times per night. He is able to return to sleep. He has been taking 50 mg Trazodone per night.  Mood: Good    He reports concerns for sciatic pain bilaterally when having to sit for prolonged periods of time  Denies symptoms while standing or walking  Reports driving about 30 minutes causes stiffness and needs to stretch   IPSS Questionnaire (AUA-7): Over the past month.   1)  How often have you had a sensation of not emptying your bladder completely after you finish urinating?  1 - Less than 1 time in 5  2)  How often have you had to urinate again less than two hours after you finished urinating? 2 - Less than half the time  3)  How often have you found you stopped and started again several times when you urinated?  2 - Less than half the time  4) How difficult have you found it to postpone urination?  1 - Less than 1 time in 5  5) How often have you had a weak urinary stream?  0 - Not at all  6) How often have you had to push or strain to begin urination?  0 - Not at all  7) How many times did you most typically get up to urinate from the time you went to bed until the time you got up in the morning?  1 - 1 time  Total score:  0-7 mildly symptomatic   8-19 moderately symptomatic   20-35 severely symptomatic       Depression: phq 9 is negative    11/07/2022    1:26 PM 04/27/2022   10:15 AM 12/30/2021    3:57 PM 10/25/2021    9:47 AM  08/16/2021   10:44 AM  Depression screen PHQ 2/9  Decreased Interest 0 0 0 0 0  Down, Depressed, Hopeless 0 0 0 0 0  PHQ - 2 Score 0 0 0 0 0  Altered sleeping 1 0 0 0 0  Tired, decreased energy 0 0 0 0 0  Change in appetite 0 0 0 0 0  Feeling bad or failure about yourself  0 0 0 0 0  Trouble concentrating 0 0 0 0 0  Moving slowly or fidgety/restless 0 0 0 0 0  Suicidal thoughts 0 0 0 0 0  PHQ-9 Score 1 0 0 0 0  Difficult doing work/chores  Not difficult at all Not difficult at all Not difficult at all Not difficult at all    Hypertension:  BP Readings from Last 3 Encounters:  11/07/22 118/76  04/27/22 123/73  10/25/21 117/74    Obesity: Wt Readings from Last 3 Encounters:  11/07/22 206 lb 9.6 oz (93.7 kg)  04/27/22 217 lb 11.2 oz (98.7 kg)  10/25/21 202 lb 9.6 oz (91.9 kg)   BMI Readings from  Last 3 Encounters:  11/07/22 28.02 kg/m  04/27/22 31.17 kg/m  10/25/21 29.00 kg/m     Lipids:  Lab Results  Component Value Date   CHOL 201 (H) 11/07/2022   CHOL 181 04/27/2022   CHOL 178 10/25/2021   Lab Results  Component Value Date   HDL 37 (L) 11/07/2022   HDL 44 04/27/2022   HDL 35 (L) 10/25/2021   Lab Results  Component Value Date   LDLCALC 125 (H) 11/07/2022   LDLCALC 101 (H) 04/27/2022   LDLCALC 107 (H) 10/25/2021   Lab Results  Component Value Date   TRIG 221 (H) 11/07/2022   TRIG 211 (H) 04/27/2022   TRIG 209 (H) 10/25/2021   Lab Results  Component Value Date   CHOLHDL 5.4 (H) 11/07/2022   CHOLHDL 4.1 04/27/2022   CHOLHDL 5.1 (H) 10/25/2021   No results found for: "LDLDIRECT" Glucose:  Glucose  Date Value Ref Range Status  11/07/2022 98 70 - 99 mg/dL Final  84/69/6295 97 70 - 99 mg/dL Final  28/41/3244 98 70 - 99 mg/dL Final    Flowsheet Row Clinical Support from 12/30/2021 in [facility]  AUDIT-C Score 0        Married STD testing and prevention (HIV/chl/gon/syphilis):  declines today  HIV screening: NA Hep  C Screening: Completed  Skin cancer: Discussed monitoring for atypical lesions Colorectal cancer screening: UTD Prostate cancer screening:  Ordered today  No results found for: "PSA"  Patient smoked when he was teenager for about 3 years. Quit when he was 19 (1970s)  Lung cancer:  Low Dose CT Chest recommended if Age 62-80 years, 30 pack-year currently smoking OR have quit w/in 15years. Patient  not applicable AAA: The USPSTF recommends one-time screening with ultrasonography in men ages 2 to 75 years who have ever smoked. Patient:  yes ECG:  NA  Vaccines:   HPV: aged out  Tdap: UTD Shingrix: Completed  Pneumonia: Completed  Flu: postponed to fall  COVID-19:Discussed vaccine and booster recommendations per available CDC guidelines    Advanced Care Planning: A voluntary discussion about advance care planning including the explanation and discussion of advance directives.  Discussed health care proxy and Living will, and the patient was able to identify a health care proxy as no one.  Patient does not have a living will in effect at this time.  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   Encounter for long-term current use of medication 01/12/2014   Anxiety 06/18/2013   Acid reflux 06/18/2013   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   Avitaminosis D 06/18/2013   Spermatocele 12/21/2011    Past Surgical History:   Procedure Laterality Date   BASAL CELL CARCINOMA EXCISION     left side of face   CHOLECYSTECTOMY     COLONOSCOPY  04/30/2014   GASTRIC FUNDOPLICATION     INGUINAL HERNIA REPAIR     PARTIAL NEPHRECTOMY Left    VASECTOMY  Family History  Problem Relation Age of Onset   Cancer Maternal Grandfather 59       stomaCH   Hypertension Mother     Social History   Socioeconomic History   Marital status: Married    Spouse name: Not on file   Number of children: Not on file   Years of education: Not on file   Highest education level: Not on file  Occupational History   Not on file  Tobacco Use   Smoking status: Former    Current packs/day: 0.00    Average packs/day: 1 pack/day for 3.0 years (3.0 ttl pk-yrs)    Types: Cigarettes    Start date: 27    Quit date: 67    Years since quitting: 47.6   Smokeless tobacco: Never  Vaping Use   Vaping status: Never Used  Substance and Sexual Activity   Alcohol use: No   Drug use: No   Sexual activity: Yes  Other Topics Concern   Not on file  Social History Narrative   Not on file   Social Determinants of Health   Financial Resource Strain: Low Risk  (12/30/2021)   Overall Financial Resource Strain (CARDIA)    Difficulty of Paying Living Expenses: Not hard at all  Food Insecurity: No Food Insecurity (12/30/2021)   Hunger Vital Sign    Worried About Running Out of Food in the Last Year: Never true    Ran Out of Food in the Last Year: Never true  Transportation Needs: No Transportation Needs (12/30/2021)   PRAPARE - Administrator, Civil Service (Medical): No    Lack of Transportation (Non-Medical): No  Physical Activity: Inactive (12/30/2021)   Exercise Vital Sign    Days of Exercise per Week: 0 days    Minutes of Exercise per Session: 0 min  Stress: No Stress Concern Present (12/30/2021)   [employer] of Occupational Health - Occupational Stress Questionnaire    Feeling of Stress : Not at all  Social  Connections: Moderately Integrated (12/30/2021)   Social Connection and Isolation Panel [NHANES]    Frequency of Communication with Friends and Family: More than three times a week    Frequency of Social Gatherings with Friends and Family: Twice a week    Attends Religious Services: More than 4 times per year    Active Member of [employer] or Organizations: No    Attends Banker Meetings: Never    Marital Status: Married  Catering manager Violence: Not At Risk (12/30/2021)   Humiliation, Afraid, Rape, and Kick questionnaire    Fear of Current or Ex-Partner: No    Emotionally Abused: No    Physically Abused: No    Sexually Abused: No     Current Outpatient Medications:    fenofibrate (TRICOR) 145 MG tablet, TAKE 1 TABLET BY MOUTH DAILY, Disp: 90 tablet, Rfl: 1   methocarbamol (ROBAXIN) 750 MG tablet, Take 1 tablet (750 mg total) by mouth 2 (two) times daily., Disp: 180 tablet, Rfl: 0   pantoprazole (PROTONIX) 40 MG tablet, TAKE 1 TABLET BY MOUTH DAILY 30-60 MINUTES BEFORE FIRST MEAL OF THE DAY, Disp: 90 tablet, Rfl: 1   promethazine (PHENERGAN) 25 MG tablet, Take 1 tablet as needed for nausea and vomiting, Disp: 90 tablet, Rfl: 1   sertraline (ZOLOFT) 100 MG tablet, TAKE 2 TABLETS BY MOUTH DAILY, Disp: 180 tablet, Rfl: 1   SUMAtriptan (IMITREX) 100 MG tablet, TAKE ONE TABLET BY MOUTH DAILY AS NEEDED, Disp: 10  tablet, Rfl: 3   amLODipine (NORVASC) 10 MG tablet, Take 1 tablet (10 mg total) by mouth daily., Disp: 90 tablet, Rfl: 1   losartan (COZAAR) 100 MG tablet, Take 1 tablet (100 mg total) by mouth daily., Disp: 90 tablet, Rfl: 1   traZODone (DESYREL) 50 MG tablet, Take 1-2 tablets (50-100 mg total) by mouth at bedtime as needed for sleep., Disp: 60 tablet, Rfl: 1  No Known Allergies   Review of Systems  Constitutional:  Negative for chills, fever and malaise/fatigue.  HENT:  Positive for ear discharge. Negative for hearing loss, nosebleeds, sore throat and tinnitus.   Eyes:   Negative for blurred vision, double vision and photophobia.  Respiratory:  Negative for shortness of breath and wheezing.   Cardiovascular:  Negative for chest pain, palpitations and leg swelling.  Gastrointestinal:  Negative for blood in stool, constipation, diarrhea, heartburn, nausea and vomiting.  Genitourinary:  Negative for dysuria, frequency and hematuria.  Musculoskeletal:  Negative for falls.  Skin:  Positive for itching and rash.       Currently being treated by Derm for rash and itching   Neurological:  Positive for tingling (on soles of feet, constant) and headaches. Negative for dizziness, tremors and loss of consciousness.  Psychiatric/Behavioral:  Negative for depression, memory loss and suicidal ideas. The patient is not nervous/anxious.        Objective  Vitals:   11/07/22 1316  BP: 118/76  Pulse: 88  SpO2: 98%  Weight: 206 lb 9.6 oz (93.7 kg)  Height: 6' (1.829 m)    Body mass index is 28.02 kg/m.  Physical Exam Vitals reviewed.  Constitutional:      General: He is awake.     Appearance: Normal appearance. He is well-developed and well-groomed.  HENT:     Head: Normocephalic and atraumatic.     Right Ear: Hearing, tympanic membrane and ear canal normal.     Left Ear: Hearing, tympanic membrane and ear canal normal.     Mouth/Throat:     Lips: Pink.     Mouth: Mucous membranes are moist.     Pharynx: Oropharynx is clear. Uvula midline. No pharyngeal swelling, oropharyngeal exudate, posterior oropharyngeal erythema or postnasal drip.     Tonsils: No tonsillar exudate or tonsillar abscesses.  Eyes:     General: Lids are normal. Gaze aligned appropriately.     Extraocular Movements: Extraocular movements intact.     Conjunctiva/sclera: Conjunctivae normal.  Neck:     Thyroid: No thyroid mass, thyromegaly or thyroid tenderness.  Cardiovascular:     Rate and Rhythm: Normal rate and regular rhythm.     Pulses: Normal pulses.          Radial pulses are  2+ on the right side and 2+ on the left side.     Heart sounds: Normal heart sounds. No murmur heard.    No friction rub. No gallop.  Pulmonary:     Effort: Pulmonary effort is normal.     Breath sounds: Normal breath sounds. No decreased air movement. No decreased breath sounds, wheezing, rhonchi or rales.  Abdominal:     General: Abdomen is flat. Bowel sounds are normal.     Palpations: Abdomen is soft.     Tenderness: There is no abdominal tenderness.  Musculoskeletal:     Cervical back: Normal range of motion.     Right lower leg: No edema.     Left lower leg: No edema.  Lymphadenopathy:     Head:  Right side of head: No submental, submandibular or preauricular adenopathy.     Left side of head: No submental, submandibular or preauricular adenopathy.     Cervical:     Right cervical: No superficial or posterior cervical adenopathy.    Left cervical: No superficial or posterior cervical adenopathy.     Upper Body:     Right upper body: No supraclavicular adenopathy.     Left upper body: No supraclavicular adenopathy.  Neurological:     General: No focal deficit present.     Mental Status: He is alert and oriented to person, place, and time.     GCS: GCS eye subscore is 4. GCS verbal subscore is 5. GCS motor subscore is 6.     Cranial Nerves: No dysarthria or facial asymmetry.     Motor: No weakness, tremor or atrophy.  Psychiatric:        Attention and Perception: Attention and perception normal.        Mood and Affect: Mood and affect normal.        Speech: Speech normal.        Behavior: Behavior normal. Behavior is cooperative.        Thought Content: Thought content normal.        Cognition and Memory: Cognition normal.        Judgment: Judgment normal.      Recent Results (from the past 2160 hour(s))  CBC w/Diff     Status: None   Collection Time: 11/07/22  2:02 PM  Result Value Ref Range   WBC 4.6 3.4 - 10.8 x10E3/uL   RBC 5.07 4.14 - 5.80 x10E6/uL    Hemoglobin 13.6 13.0 - 17.7 g/dL   Hematocrit 28.4 13.2 - 51.0 %   MCV 81 79 - 97 fL   MCH 26.8 26.6 - 33.0 pg   MCHC 33.3 31.5 - 35.7 g/dL   RDW 44.0 10.2 - 72.5 %   Platelets 154 150 - 450 x10E3/uL   Neutrophils 64 Not Estab. %   Lymphs 21 Not Estab. %   Monocytes 8 Not Estab. %   Eos 6 Not Estab. %   Basos 1 Not Estab. %   Neutrophils Absolute 3.0 1.4 - 7.0 x10E3/uL   Lymphocytes Absolute 1.0 0.7 - 3.1 x10E3/uL   Monocytes Absolute 0.4 0.1 - 0.9 x10E3/uL   EOS (ABSOLUTE) 0.3 0.0 - 0.4 x10E3/uL   Basophils Absolute 0.1 0.0 - 0.2 x10E3/uL   Immature Granulocytes 0 Not Estab. %   Immature Grans (Abs) 0.0 0.0 - 0.1 x10E3/uL  Comp Met (CMET)     Status: Abnormal   Collection Time: 11/07/22  2:02 PM  Result Value Ref Range   Glucose 98 70 - 99 mg/dL   BUN 19 8 - 27 mg/dL   Creatinine, Ser 3.66 (H) 0.76 - 1.27 mg/dL   eGFR 57 (L) >44 IH/KVQ/2.59   BUN/Creatinine Ratio 14 10 - 24   Sodium 140 134 - 144 mmol/L   Potassium 4.3 3.5 - 5.2 mmol/L   Chloride 100 96 - 106 mmol/L   CO2 24 20 - 29 mmol/L   Calcium 9.8 8.6 - 10.2 mg/dL   Total Protein 7.0 6.0 - 8.5 g/dL   Albumin 5.0 (H) 3.9 - 4.9 g/dL   Globulin, Total 2.0 1.5 - 4.5 g/dL   Bilirubin Total 0.4 0.0 - 1.2 mg/dL   Alkaline Phosphatase 57 44 - 121 IU/L   AST 22 0 - 40 IU/L   ALT 17 0 -  44 IU/L  Lipid Profile     Status: Abnormal   Collection Time: 11/07/22  2:02 PM  Result Value Ref Range   Cholesterol, Total 201 (H) 100 - 199 mg/dL   Triglycerides 253 (H) 0 - 149 mg/dL   HDL 37 (L) >66 mg/dL   VLDL Cholesterol Cal 39 5 - 40 mg/dL   LDL Chol Calc (NIH) 440 (H) 0 - 99 mg/dL   Chol/HDL Ratio 5.4 (H) 0.0 - 5.0 ratio    Comment:                                   T. Chol/HDL Ratio                                             Men  Women                               1/2 Avg.Risk  3.4    3.3                                   Avg.Risk  5.0    4.4                                2X Avg.Risk  9.6    7.1                                 3X Avg.Risk 23.4   11.0   HgB A1c     Status: None   Collection Time: 11/07/22  2:02 PM  Result Value Ref Range   Hgb A1c MFr Bld 5.5 4.8 - 5.6 %    Comment:          Prediabetes: 5.7 - 6.4          Diabetes: >6.4          Glycemic control for adults with diabetes: <7.0    Est. average glucose Bld gHb Est-mCnc 111 mg/dL  TSH     Status: None   Collection Time: 11/07/22  2:02 PM  Result Value Ref Range   TSH 3.740 0.450 - 4.500 uIU/mL  PSA     Status: None   Collection Time: 11/07/22  2:02 PM  Result Value Ref Range   Prostate Specific Ag, Serum 0.5 0.0 - 4.0 ng/mL    Comment: Roche ECLIA methodology. According to the American Urological Association, Serum PSA should decrease and remain at undetectable levels after radical prostatectomy. The AUA defines biochemical recurrence as an initial PSA value 0.2 ng/mL or greater followed by a subsequent confirmatory PSA value 0.2 ng/mL or greater. Values obtained with different assay methods or kits cannot be used interchangeably. Results cannot be interpreted as absolute evidence of the presence or absence of malignant disease.   B12     Status: None   Collection Time: 11/07/22  2:02 PM  Result Value Ref Range   Vitamin B-12 694 232 - 1,245 pg/mL     Fall Risk:    11/07/2022    1:26 PM 04/27/2022  10:15 AM 12/30/2021    3:58 PM 10/25/2021    9:47 AM 08/16/2021   10:44 AM  Fall Risk   Falls in the past year? 0 0 0 0 0  Number falls in past yr: 0 0 0 0 0  Injury with Fall? 0 0 0 0 0  Risk for fall due to : No Fall Risks No Fall Risks No Fall Risks No Fall Risks No Fall Risks  Follow up Falls evaluation completed Falls evaluation completed Falls evaluation completed Falls evaluation completed Falls evaluation completed     Functional Status Survey:      Assessment & Plan  Problem List Items Addressed This Visit       Cardiovascular and Mediastinum   Essential (primary) hypertension    Chronic, historic condition   Appears well controlled on current regimen comprised of Losartan 100 mg PO every day and Amlodipine 10 mg PO every day  Refills provided today  Continue current regimen  Recommend he checks BP at home and engages with diet and exercise efforts to assist with cardiovascular health. Follow up in 6 months or sooner if concerns arise        Relevant Medications   losartan (COZAAR) 100 MG tablet   amLODipine (NORVASC) 10 MG tablet     Other   Insomnia    Chronic, historic condition He reports some issues with sleep initiation and maintenance despite taking trazodone  50 mg PO at bedtime  Will try increasing to 50-100 mg PO at bedtime for further control Follow up in about 4 weeks for monitoring to assess response and potential need for regimen change.       Relevant Medications   traZODone (DESYREL) 50 MG tablet   Advanced care planning/counseling discussion    A voluntary discussion about advance care planning including the explanation and discussion of advance directives was extensively discussed  with the patient for 5 minutes with patient and myself present.  Explanation about the health care proxy and Living will was reviewed and packet with forms with explanation of how to fill them out was given.  During this discussion, the patient was not able to identify a health care proxy and plans to fill out the paperwork required.  Patient was offered a separate Advance Care Planning visit for further assistance with forms.         Other Visit Diagnoses     Annual physical exam    -  Primary -Prostate cancer screening and PSA options (with potential risks and benefits of testing vs not testing) were discussed along with recent recs/guidelines. -USPSTF grade A and B recommendations reviewed with patient; age-appropriate recommendations, preventive care, screening tests, etc discussed and encouraged; healthy living encouraged; see AVS for patient education given to patient -Discussed  importance of 150 minutes of physical activity weekly, eat two servings of fish weekly, eat one serving of tree nuts ( cashews, pistachios, pecans, almonds.Marland Kitchen) every other day, eat 6 servings of fruit/vegetables daily and drink plenty of water and avoid sweet beverages.  -Reviewed Health Maintenance: yes    Relevant Orders   [place] MEDICARE SCREENING   CBC w/Diff (Completed)   Comp Met (CMET) (Completed)   Lipid Profile (Completed)   HgB A1c (Completed)   TSH (Completed)   PSA (Completed)   Encounter for screening for abdominal aortic aneurysm (AAA) in patient 61 years of age or older with history of smoking       Relevant Orders   [place] MEDICARE  SCREENING   Tingling of both feet     Newly reported He states he has been having tingling in the soles of his feet for several weeks  Will check b12 for potential deficiency Follow up in about 4 weeks to further discuss     Relevant Orders   B12 (Completed)        Return in about 4 weeks (around 12/05/2022) for sleep, tingling in feet .   I,  E , PA-C, have reviewed all documentation for this visit. The documentation on 11/08/22 for the exam, diagnosis, procedures, and orders are all accurate and complete.   Jacquelin Hawking, MHS, PA-C [facility]

## 2022-11-07 NOTE — Patient Instructions (Addendum)
Please try taking two Trazodone tablets per night (equal to 100 mg per night) to see if this helps with sleep.

## 2022-11-08 DIAGNOSIS — Z7189 Other specified counseling: Secondary | ICD-10-CM | POA: Insufficient documentation

## 2022-11-08 NOTE — Assessment & Plan Note (Signed)
Chronic, historic condition He reports some issues with sleep initiation and maintenance despite taking trazodone  50 mg PO at bedtime  Will try increasing to 50-100 mg PO at bedtime for further control Follow up in about 4 weeks for monitoring to assess response and potential need for regimen change.

## 2022-11-08 NOTE — Assessment & Plan Note (Signed)

## 2022-11-08 NOTE — Assessment & Plan Note (Signed)
Chronic, historic condition  Appears well controlled on current regimen comprised of Losartan 100 mg PO every day and Amlodipine 10 mg PO every day  Refills provided today  Continue current regimen  Recommend he checks BP at home and engages with diet and exercise efforts to assist with cardiovascular health. Follow up in 6 months or sooner if concerns arise

## 2022-11-09 DIAGNOSIS — B9689 Other specified bacterial agents as the cause of diseases classified elsewhere: Secondary | ICD-10-CM | POA: Diagnosis not present

## 2022-11-09 DIAGNOSIS — Z03818 Encounter for observation for suspected exposure to other biological agents ruled out: Secondary | ICD-10-CM | POA: Diagnosis not present

## 2022-11-09 DIAGNOSIS — J029 Acute pharyngitis, unspecified: Secondary | ICD-10-CM | POA: Diagnosis not present

## 2022-11-09 DIAGNOSIS — J019 Acute sinusitis, unspecified: Secondary | ICD-10-CM | POA: Diagnosis not present

## 2022-11-09 DIAGNOSIS — J40 Bronchitis, not specified as acute or chronic: Secondary | ICD-10-CM | POA: Diagnosis not present

## 2022-11-10 NOTE — Progress Notes (Signed)
Labs are normal/stable with the exception of your cholesterol  It is elevated from previous testing. I recommend you continue to take your fenofibrate but if it remains elevated we may have to add a statin to your regimen For now please reduce your intake of saturated fats and try to increase your weekly exercise. We can recheck in 6 months and make a decision about further medication at that time.

## 2022-11-14 ENCOUNTER — Ambulatory Visit: Payer: Medicare Other

## 2022-11-15 ENCOUNTER — Ambulatory Visit: Payer: Medicare Other

## 2022-11-28 ENCOUNTER — Ambulatory Visit
Admission: RE | Admit: 2022-11-28 | Discharge: 2022-11-28 | Disposition: A | Discharge status: Home / Self Care | Payer: Medicare Other | Source: Ambulatory Visit | Attending: Physician Assistant | Admitting: Physician Assistant

## 2022-11-28 DIAGNOSIS — Z87891 Personal history of nicotine dependence: Secondary | ICD-10-CM

## 2022-11-28 DIAGNOSIS — Z0000 Encounter for general adult medical examination without abnormal findings: Secondary | ICD-10-CM

## 2022-11-28 DIAGNOSIS — Z136 Encounter for screening for cardiovascular disorders: Secondary | ICD-10-CM | POA: Diagnosis not present

## 2022-11-28 DIAGNOSIS — N1832 Chronic kidney disease, stage 3b: Secondary | ICD-10-CM | POA: Diagnosis not present

## 2022-11-28 DIAGNOSIS — I10 Essential (primary) hypertension: Secondary | ICD-10-CM | POA: Diagnosis not present

## 2022-11-28 DIAGNOSIS — D3000 Benign neoplasm of unspecified kidney: Secondary | ICD-10-CM | POA: Diagnosis not present

## 2022-11-29 DIAGNOSIS — D2261 Melanocytic nevi of right upper limb, including shoulder: Secondary | ICD-10-CM | POA: Diagnosis not present

## 2022-11-29 DIAGNOSIS — D2262 Melanocytic nevi of left upper limb, including shoulder: Secondary | ICD-10-CM | POA: Diagnosis not present

## 2022-11-29 DIAGNOSIS — Z85828 Personal history of other malignant neoplasm of skin: Secondary | ICD-10-CM | POA: Diagnosis not present

## 2022-11-29 DIAGNOSIS — Z872 Personal history of diseases of the skin and subcutaneous tissue: Secondary | ICD-10-CM | POA: Diagnosis not present

## 2022-11-29 DIAGNOSIS — L570 Actinic keratosis: Secondary | ICD-10-CM | POA: Diagnosis not present

## 2022-11-29 DIAGNOSIS — X32XXXA Exposure to sunlight, initial encounter: Secondary | ICD-10-CM | POA: Diagnosis not present

## 2022-11-29 DIAGNOSIS — Z09 Encounter for follow-up examination after completed treatment for conditions other than malignant neoplasm: Secondary | ICD-10-CM | POA: Diagnosis not present

## 2022-11-29 DIAGNOSIS — D2272 Melanocytic nevi of left lower limb, including hip: Secondary | ICD-10-CM | POA: Diagnosis not present

## 2022-12-05 ENCOUNTER — Encounter: Payer: Self-pay | Admitting: Physician Assistant

## 2022-12-05 ENCOUNTER — Ambulatory Visit (INDEPENDENT_AMBULATORY_CARE_PROVIDER_SITE_OTHER): Payer: Medicare Other | Admitting: Physician Assistant

## 2022-12-05 DIAGNOSIS — R202 Paresthesia of skin: Secondary | ICD-10-CM | POA: Diagnosis not present

## 2022-12-05 NOTE — Progress Notes (Signed)
Acute Office Visit   Patient: Chris Daugherty   DOB: [birth year]   67 y.o. Male  MRN: [id number] Visit Date: 12/05/2022  Today's healthcare provider: Oswaldo Conroy Katalia Choma, PA-C  Introduced myself to the patient as a Secondary school teacher and provided education on APPs in clinical practice.    Chief Complaint  Patient presents with   Ultrasound Results   Subjective    HPI   Tingling in feet  Occurring off and on  B12 was normal at physical  He reports it is there a little bit all the time- a mix of numbness and tingling States it started when he was 67 yo  Primarily just on the bottoms of his feet He does report some history of issues with Sciatica in right leg - especially while driving for prolonged periods  He has not noticed a correlation between sciatica flare and increased tingling in feet  He states the numbness starts along the arch and extends to toes  Sometimes has some restless leg at night  He states that he sometimes feels the need to move his legs when he is trying to go to sleep He does sometimes have trouble falling asleep - he has tried taking 2 Trazodone at bedtime and this has helped with sleeplessness     Medications: Outpatient Medications Prior to Visit  Medication Sig   amLODipine (NORVASC) 10 MG tablet Take 1 tablet (10 mg total) by mouth daily.   fenofibrate (TRICOR) 145 MG tablet TAKE 1 TABLET BY MOUTH DAILY   losartan (COZAAR) 100 MG tablet Take 1 tablet (100 mg total) by mouth daily.   methocarbamol (ROBAXIN) 750 MG tablet Take 1 tablet (750 mg total) by mouth 2 (two) times daily.   pantoprazole (PROTONIX) 40 MG tablet TAKE 1 TABLET BY MOUTH DAILY 30-60 MINUTES BEFORE FIRST MEAL OF THE DAY   promethazine (PHENERGAN) 25 MG tablet Take 1 tablet as needed for nausea and vomiting   sertraline (ZOLOFT) 100 MG tablet TAKE 2 TABLETS BY MOUTH DAILY   SUMAtriptan (IMITREX) 100 MG tablet TAKE ONE TABLET BY MOUTH DAILY AS NEEDED   traZODone (DESYREL) 50 MG tablet Take  1-2 tablets (50-100 mg total) by mouth at bedtime as needed for sleep.   No facility-administered medications prior to visit.    Review of Systems  Neurological:  Positive for numbness.        Objective    There were no vitals taken for this visit.    Physical Exam Vitals reviewed.  Constitutional:      General: He is awake.     Appearance: Normal appearance. He is well-developed and well-groomed.  HENT:     Head: Normocephalic and atraumatic.  Pulmonary:     Effort: Pulmonary effort is normal.  Musculoskeletal:     Right foot: Normal range of motion.     Left foot: Normal range of motion.  Feet:     Right foot:     Protective Sensation: 8 sites tested.  6 sites sensed.     Skin integrity: Skin integrity normal.     Left foot:     Protective Sensation: 8 sites tested.  6 sites sensed.     Skin integrity: Skin integrity normal.     Comments: Mildly decreased sensation along the pads of the foot  Sensation intact bilaterally along arches, heels and toes  Neurological:     Mental Status: He is alert.  Psychiatric:  Behavior: Behavior is cooperative.       No results found for any visits on 12/05/22.  Assessment & Plan      Return in about 6 months (around 06/04/2023) for HTN, HLD.       Problem List Items Addressed This Visit   None Visit Diagnoses     Tingling of both feet    -  Primary Chronic, ongoing Mildly decreased sensation along foot pads bilaterally but toes, arches and heels have intact sensation to monofilament Will try treating for plantar fasciitis with massage to the soles of feet, supportive shoes and stretches for now with goal of improving intermittent symptoms If not improving with these measures, may need to refer to Neurology or podiatry for further evaluation  Follow up as needed for persistent or progressing symptoms          Return in about 6 months (around 06/04/2023) for HTN, HLD.   I, Tilly Pernice E Omid Deardorff, PA-C, have reviewed  all documentation for this visit. The documentation on 12/10/22 for the exam, diagnosis, procedures, and orders are all accurate and complete.   Jacquelin Hawking, MHS, PA-C [facility]

## 2022-12-05 NOTE — Progress Notes (Signed)
AAA screening was normal- no evidence of aortic aneurysm Reviewed results with patient during apt on 12/05/22

## 2023-01-11 DIAGNOSIS — J069 Acute upper respiratory infection, unspecified: Secondary | ICD-10-CM | POA: Diagnosis not present

## 2023-01-11 DIAGNOSIS — Z03818 Encounter for observation for suspected exposure to other biological agents ruled out: Secondary | ICD-10-CM | POA: Diagnosis not present

## 2023-01-11 DIAGNOSIS — J0100 Acute maxillary sinusitis, unspecified: Secondary | ICD-10-CM | POA: Diagnosis not present

## 2023-01-26 DIAGNOSIS — J019 Acute sinusitis, unspecified: Secondary | ICD-10-CM | POA: Diagnosis not present

## 2023-01-26 DIAGNOSIS — R051 Acute cough: Secondary | ICD-10-CM | POA: Diagnosis not present

## 2023-01-26 DIAGNOSIS — B9689 Other specified bacterial agents as the cause of diseases classified elsewhere: Secondary | ICD-10-CM | POA: Diagnosis not present

## 2023-01-26 DIAGNOSIS — Z03818 Encounter for observation for suspected exposure to other biological agents ruled out: Secondary | ICD-10-CM | POA: Diagnosis not present

## 2023-02-02 ENCOUNTER — Other Ambulatory Visit: Payer: Self-pay | Admitting: Nurse Practitioner

## 2023-02-02 DIAGNOSIS — F5101 Primary insomnia: Secondary | ICD-10-CM

## 2023-02-02 MED ORDER — TRAZODONE HCL 50 MG PO TABS: 50.0000 mg | ORAL_TABLET | Freq: Every evening | ORAL | 1 refills | Status: DC | PRN

## 2023-02-02 NOTE — Telephone Encounter (Signed)
Requested Prescriptions  Pending Prescriptions Disp Refills   traZODone (DESYREL) 50 MG tablet 180 tablet 1    Sig: Take 1-2 tablets (50-100 mg total) by mouth at bedtime as needed for sleep.     Psychiatry: Antidepressants - Serotonin Modulator Passed - 02/02/2023 10:49 AM      Passed - Valid encounter within last 6 months    Recent Outpatient Visits           1 month ago Tingling of both feet   [place] [facility] Mecum, Oswaldo Conroy, PA-C   2 months ago Annual physical exam   [place] [facility] Mecum, Oswaldo Conroy, PA-C   9 months ago Essential (primary) hypertension   [place] [facility] Larae Grooms, NP   1 year ago Annual physical exam   [place] [facility] Larae Grooms, NP   1 year ago Psoriasis   [place] [facility] Larae Grooms, NP       Future Appointments             In 4 months Larae Grooms, NP  [facility]

## 2023-02-02 NOTE — Telephone Encounter (Signed)
Medication Refill - Medication:  traZODone (DESYREL) 50 MG tablet   *Patient states he has been taking 2 due to needing those 2 at night for sleeping, patient is completely    Has the patient contacted their pharmacy?  Patient states he texted the pharmacy for a refill but not spoke to a pharmacist, but will call the pharmacy after we hang up.  Preferred Pharmacy (with phone number or street name):  [id number] [place], [place] - [address]  Phone: [phone] Fax: [phone]   Has the patient been seen for an appointment in the last year OR does the patient have an upcoming appointment? Yes

## 2023-02-06 DIAGNOSIS — S93401A Sprain of unspecified ligament of right ankle, initial encounter: Secondary | ICD-10-CM | POA: Diagnosis not present

## 2023-02-06 DIAGNOSIS — M79671 Pain in right foot: Secondary | ICD-10-CM | POA: Diagnosis not present

## 2023-02-06 DIAGNOSIS — S93601A Unspecified sprain of right foot, initial encounter: Secondary | ICD-10-CM | POA: Diagnosis not present

## 2023-02-06 DIAGNOSIS — M25571 Pain in right ankle and joints of right foot: Secondary | ICD-10-CM | POA: Diagnosis not present

## 2023-02-07 DIAGNOSIS — M25571 Pain in right ankle and joints of right foot: Secondary | ICD-10-CM | POA: Diagnosis not present

## 2023-02-07 DIAGNOSIS — S93601A Unspecified sprain of right foot, initial encounter: Secondary | ICD-10-CM | POA: Diagnosis not present

## 2023-02-07 DIAGNOSIS — S93401A Sprain of unspecified ligament of right ankle, initial encounter: Secondary | ICD-10-CM | POA: Diagnosis not present

## 2023-02-07 DIAGNOSIS — M79671 Pain in right foot: Secondary | ICD-10-CM | POA: Diagnosis not present

## 2023-02-16 DIAGNOSIS — M25571 Pain in right ankle and joints of right foot: Secondary | ICD-10-CM | POA: Diagnosis not present

## 2023-02-16 DIAGNOSIS — S93401A Sprain of unspecified ligament of right ankle, initial encounter: Secondary | ICD-10-CM | POA: Diagnosis not present

## 2023-02-16 DIAGNOSIS — M79671 Pain in right foot: Secondary | ICD-10-CM | POA: Diagnosis not present

## 2023-02-16 DIAGNOSIS — S93601A Unspecified sprain of right foot, initial encounter: Secondary | ICD-10-CM | POA: Diagnosis not present

## 2023-03-13 ENCOUNTER — Ambulatory Visit: Payer: Medicare Other | Admitting: Emergency Medicine

## 2023-03-13 VITALS — Ht 72.0 in | Wt 215.0 lb

## 2023-03-13 DIAGNOSIS — Z0000 Encounter for general adult medical examination without abnormal findings: Secondary | ICD-10-CM | POA: Diagnosis not present

## 2023-03-13 NOTE — Patient Instructions (Addendum)
[name] , Thank you for taking time to come for your Medicare Wellness Visit. I appreciate your ongoing commitment to your health goals. Please review the following plan we discussed and let me know if I can assist you in the future.   Referrals/Orders/Follow-Ups/Clinician Recommendations: Keep up the good work!!  This is a list of the screening recommended for you and due dates:  Health Maintenance  Topic Date Due   COVID-19 Vaccine (5 - 2023-24 season) 12/03/2022   Medicare Annual Wellness Visit  03/12/2024   Colon Cancer Screening  04/30/2024   DTaP/Tdap/Td vaccine (3 - Td or Tdap) 05/29/2029   Pneumonia Vaccine  Completed   Flu Shot  Completed   Hepatitis C Screening  Completed   Zoster (Shingles) Vaccine  Completed   HPV Vaccine  Aged Out    Advanced directives: (ACP Link)Information on Advanced Care Planning can be found at [facility] Advance Health Care Directives Advance Health Care Directives ([email or web address]   Once you have completed the forms, please bring a copy of your health care power of attorney and living will to the office to be added to your chart at your convenience.   Next Medicare Annual Wellness Visit scheduled for next year: Yes, 03/18/24 @ 1:10pm  Fall Prevention in the Home, Adult Falls can cause injuries and affect people of all ages. There are many simple things that you can do to make your home safe and to help prevent falls. If you need it, ask for help making these changes. What actions can I take to prevent falls? General information Use good lighting in all rooms. Make sure to: Replace any light bulbs that burn out. Turn on lights if it is dark and use night-lights. Keep items that you use often in easy-to-reach places. Lower the shelves around your home if needed. Move furniture so that there are clear paths around it. Do not keep throw rugs or other things on the floor that can make you trip. If any of your floors are uneven,  fix them. Add color or contrast paint or tape to clearly mark and help you see: Grab bars or handrails. First and last steps of staircases. Where the edge of each step is. If you use a ladder or stepladder: Make sure that it is fully opened. Do not climb a closed ladder. Make sure the sides of the ladder are locked in place. Have someone hold the ladder while you use it. Know where your pets are as you move through your home. What can I do in the bathroom?     Keep the floor dry. Clean up any water that is on the floor right away. Remove soap buildup in the bathtub or shower. Buildup makes bathtubs and showers slippery. Use non-skid mats or decals on the floor of the bathtub or shower. Attach bath mats securely with double-sided, non-slip rug tape. If you need to sit down while you are in the shower, use a non-slip stool. Install grab bars by the toilet and in the bathtub and shower. Do not use towel bars as grab bars. What can I do in the bedroom? Make sure that you have a light by your bed that is easy to reach. Do not use any sheets or blankets on your bed that hang to the floor. Have a firm bench or chair with side arms that you can use for support when you get dressed. What can I do in the kitchen? Clean up any spills  right away. If you need to reach something above you, use a sturdy step stool that has a grab bar. Keep electrical cables out of the way. Do not use floor polish or wax that makes floors slippery. What can I do with my stairs? Do not leave anything on the stairs. Make sure that you have a light switch at the top and the bottom of the stairs. Have them installed if you do not have them. Make sure that there are handrails on both sides of the stairs. Fix handrails that are broken or loose. Make sure that handrails are as long as the staircases. Install non-slip stair treads on all stairs in your home if they do not have carpet. Avoid having throw rugs at the top or  bottom of stairs, or secure the rugs with carpet tape to prevent them from moving. Choose a carpet design that does not hide the edge of steps on the stairs. Make sure that carpet is firmly attached to the stairs. Fix any carpet that is loose or worn. What can I do on the outside of my home? Use bright outdoor lighting. Repair the edges of walkways and driveways and fix any cracks. Clear paths of anything that can make you trip, such as tools or rocks. Add color or contrast paint or tape to clearly mark and help you see high doorway thresholds. Trim any bushes or trees on the main path into your home. Check that handrails are securely fastened and in good repair. Both sides of all steps should have handrails. Install guardrails along the edges of any raised decks or porches. Have leaves, snow, and ice cleared regularly. Use sand, salt, or ice melt on walkways during winter months if you live where there is ice and snow. In the garage, clean up any spills right away, including grease or oil spills. What other actions can I take? Review your medicines with your health care provider. Some medicines can make you confused or feel dizzy. This can increase your chance of falling. Wear closed-toe shoes that fit well and support your feet. Wear shoes that have rubber soles and low heels. Use a cane, walker, scooter, or crutches that help you move around if needed. Talk with your provider about other ways that you can decrease your risk of falls. This may include seeing a physical therapist to learn to do exercises to improve movement and strength. Where to find more information Centers for Disease Control and Prevention, [employer]AD[employer]: TonerPromos.no [employer] on Aging: BaseRingTones.pl National Institute on Aging: BaseRingTones.pl Contact a health care provider if: You are afraid of falling at home. You feel weak, drowsy, or dizzy at home. You fall at home. Get help right away if you: Lose consciousness or have  trouble moving after a fall. Have a fall that causes a head injury. These symptoms may be an emergency. Get help right away. Call 911. Do not wait to see if the symptoms will go away. Do not drive yourself to the hospital. This information is not intended to replace advice given to you by your health care provider. Make sure you discuss any questions you have with your health care provider. Document Revised: 11/21/2021 Document Reviewed: 11/21/2021 Elsevier Patient Education  2024 [employer].

## 2023-03-13 NOTE — Progress Notes (Signed)
I, as supervising provider, have reviewed the nurse health advisor's Medicare Wellness Visit note for this patient and concur with the findings and recommendations listed above.

## 2023-03-13 NOTE — Progress Notes (Signed)
Subjective:   Chris Daugherty is a 67 y.o. male who presents for Medicare Annual/Subsequent preventive examination.  Visit Complete: Virtual I connected with  Chris Daugherty on 03/13/23 by a audio enabled telemedicine application and verified that I am speaking with the correct person using two identifiers.  Patient Location: Home  Provider Location: Office/Clinic  I discussed the limitations of evaluation and management by telemedicine. The patient expressed understanding and agreed to proceed.  Vital Signs: Because this visit was a virtual/telehealth visit, some criteria may be missing or patient reported. Any vitals not documented were not able to be obtained and vitals that have been documented are patient reported.   Cardiac Risk Factors include: advanced age (>8men, >59 women);male gender;hypertension;dyslipidemia     Objective:    Today's Vitals   03/13/23 1327  Weight: 215 lb (97.5 kg)  Height: 6' (1.829 m)   Body mass index is 29.16 kg/m.     03/13/2023    1:41 PM 12/01/2018    9:56 AM  Advanced Directives  Does Patient Have a Medical Advance Directive? No No  Would patient like information on creating a medical advance directive? Yes (MAU/Ambulatory/Procedural Areas - Information given) No - Patient declined    Current Medications (verified) Outpatient Encounter Medications as of 03/13/2023  Medication Sig   amLODipine (NORVASC) 10 MG tablet Take 1 tablet (10 mg total) by mouth daily.   clobetasol ointment (TEMOVATE) 0.05 % Apply 1 Application topically 2 (two) times daily as needed.   fenofibrate (TRICOR) 145 MG tablet TAKE 1 TABLET BY MOUTH DAILY   losartan (COZAAR) 100 MG tablet Take 1 tablet (100 mg total) by mouth daily.   methocarbamol (ROBAXIN) 750 MG tablet Take 1 tablet (750 mg total) by mouth 2 (two) times daily.   pantoprazole (PROTONIX) 40 MG tablet TAKE 1 TABLET BY MOUTH DAILY 30-60 MINUTES BEFORE FIRST MEAL OF THE DAY   promethazine (PHENERGAN)  25 MG tablet Take 1 tablet as needed for nausea and vomiting   sertraline (ZOLOFT) 100 MG tablet TAKE 2 TABLETS BY MOUTH DAILY   SUMAtriptan (IMITREX) 100 MG tablet TAKE ONE TABLET BY MOUTH DAILY AS NEEDED   traZODone (DESYREL) 50 MG tablet Take 1-2 tablets (50-100 mg total) by mouth at bedtime as needed for sleep.   No facility-administered encounter medications on file as of 03/13/2023.    Allergies (verified) Patient has no known allergies.   History: Past Medical History:  Diagnosis Date   Acid reflux    Allergic rhinitis    Allergy    Anxiety    Depression    Hyperlipidemia    Hypertension    Testicular hypofunction    Past Surgical History:  Procedure Laterality Date   BASAL CELL CARCINOMA EXCISION     left side of face   CHOLECYSTECTOMY     COLONOSCOPY  04/30/2014   GASTRIC FUNDOPLICATION     INGUINAL HERNIA REPAIR     PARTIAL NEPHRECTOMY Left    VASECTOMY     Family History  Problem Relation Age of Onset   Hypertension Mother    Other Father 19       auto accident   Cancer Maternal Grandfather 27       stomaCH   Social History   Socioeconomic History   Marital status: Married    Spouse name: Burna Mortimer   Number of children: 1   Years of education: Not on file   Highest education level: Not on file  Occupational History  Occupation: pastor  Tobacco Use   Smoking status: Former    Current packs/day: 0.00    Average packs/day: 1 pack/day for 3.0 years (3.0 ttl pk-yrs)    Types: Cigarettes    Start date: 94    Quit date: 66    Years since quitting: 47.9   Smokeless tobacco: Never  Vaping Use   Vaping status: Never Used  Substance and Sexual Activity   Alcohol use: No   Drug use: No   Sexual activity: Yes  Other Topics Concern   Not on file  Social History Narrative   Works Engineer, production   Social Determinants of Health   Financial Resource Strain: Low Risk  (03/13/2023)   Overall Financial Resource Strain (CARDIA)    Difficulty of  Paying Living Expenses: Not hard at all  Food Insecurity: No Food Insecurity (03/13/2023)   Hunger Vital Sign    Worried About Running Out of Food in the Last Year: Never true    Ran Out of Food in the Last Year: Never true  Transportation Needs: No Transportation Needs (03/13/2023)   PRAPARE - Administrator, Civil Service (Medical): No    Lack of Transportation (Non-Medical): No  Physical Activity: Inactive (03/13/2023)   Exercise Vital Sign    Days of Exercise per Week: 0 days    Minutes of Exercise per Session: 0 min  Stress: No Stress Concern Present (03/13/2023)   [employer] of Occupational Health - Occupational Stress Questionnaire    Feeling of Stress : Only a little  Social Connections: Moderately Integrated (03/13/2023)   Social Connection and Isolation Panel [NHANES]    Frequency of Communication with Friends and Family: More than three times a week    Frequency of Social Gatherings with Friends and Family: More than three times a week    Attends Religious Services: More than 4 times per year    Active Member of [employer] or Organizations: No    Attends Engineer, structural: Never    Marital Status: Married    Tobacco Counseling Counseling given: Not Answered   Clinical Intake:  Pre-visit preparation completed: Yes  Pain : No/denies pain     BMI - recorded: 29.16 Nutritional Status: BMI 25 -29 Overweight Nutritional Risks: None Diabetes: No  How often do you need to have someone help you when you read instructions, pamphlets, or other written materials from your doctor or pharmacy?: 1 - Never  Interpreter Needed?: No  Information entered by :: Tora Kindred, CMA   Activities of Daily Living    03/13/2023    1:30 PM  In your present state of health, do you have any difficulty performing the following activities:  Hearing? 0  Vision? 0  Difficulty concentrating or making decisions? 0  Walking or climbing stairs? 0  Dressing or  bathing? 0  Doing errands, shopping? 0  Preparing Food and eating ? N  Using the Toilet? N  In the past six months, have you accidently leaked urine? N  Do you have problems with loss of bowel control? N  Managing your Medications? N  Managing your Finances? N  Housekeeping or managing your Housekeeping? N    Patient Care Team: Larae Grooms, NP as PCP - General  Indicate any recent Medical Services you may have received from other than [facility] providers in the past year (date may be approximate).     Assessment:   This is a routine wellness examination for Arihant.  Hearing/Vision screen Hearing  Screening - Comments:: Denies hearing loss Vision Screening - Comments:: Gets eye exams   Goals Addressed               This Visit's Progress     Patient Stated (pt-stated)        Maintain current health      Depression Screen    03/13/2023    1:39 PM 12/05/2022   11:20 AM 11/07/2022    1:26 PM 04/27/2022   10:15 AM 12/30/2021    3:57 PM 10/25/2021    9:47 AM 08/16/2021   10:44 AM  PHQ 2/9 Scores  PHQ - 2 Score 0 0 0 0 0 0 0  PHQ- 9 Score 0 1 1 0 0 0 0    Fall Risk    03/13/2023    1:42 PM 12/05/2022   11:20 AM 11/07/2022    1:26 PM 04/27/2022   10:15 AM 12/30/2021    3:58 PM  Fall Risk   Falls in the past year? 1 0 0 0 0  Number falls in past yr: 0 0 0 0 0  Injury with Fall? 1 0 0 0 0  Risk for fall due to : History of fall(s);Impaired balance/gait;Orthopedic patient No Fall Risks No Fall Risks No Fall Risks No Fall Risks  Follow up Education provided;Falls prevention discussed;Falls evaluation completed Falls evaluation completed Falls evaluation completed Falls evaluation completed Falls evaluation completed    MEDICARE RISK AT HOME: Medicare Risk at Home Any stairs in or around the home?: Yes If so, are there any without handrails?: No Home free of loose throw rugs in walkways, pet beds, electrical cords, etc?: Yes Adequate lighting in your home to reduce risk  of falls?: Yes Life alert?: No Use of a cane, walker or w/c?: No Grab bars in the bathroom?: No Shower chair or bench in shower?: Yes Elevated toilet seat or a handicapped toilet?: Yes  TIMED UP AND GO:  Was the test performed?  No    Cognitive Function:        03/13/2023    1:44 PM 12/30/2021    3:59 PM 12/28/2020    5:11 PM  6CIT Screen  What Year? 0 points 0 points 0 points  What month? 0 points 0 points 0 points  What time? 0 points 0 points 0 points  Count back from 20 0 points 0 points 0 points  Months in reverse 0 points 0 points 0 points  Repeat phrase 0 points 0 points 0 points  Total Score 0 points 0 points 0 points    Immunizations Immunization History  Administered Date(s) Administered   Fluad Quad(high Dose 65+) 01/06/2022   Influenza, High Dose Seasonal PF 12/03/2019   Influenza,inj,Quad PF,6+ Mos 12/11/2014   Influenza-Unspecified 12/19/2016, 11/30/2017, 12/27/2018, 11/15/2020, 12/05/2022   PFIZER(Purple Top)SARS-COV-2 Vaccination 06/20/2019, 07/15/2019, 01/02/2020, 11/15/2020   PNEUMOCOCCAL CONJUGATE-20 10/25/2021   Pneumococcal Conjugate-13 05/26/2020   Tdap 11/11/2015, 05/30/2019   Zoster Recombinant(Shingrix) 01/24/2019, 04/02/2019   Zoster, Live 09/28/2015    TDAP status: Up to date  Flu Vaccine status: Up to date  Pneumococcal vaccine status: Up to date  Covid-19 vaccine status: Declined, Education has been provided regarding the importance of this vaccine but patient still declined. Advised may receive this vaccine at local pharmacy or Health Dept.or vaccine clinic. Aware to provide a copy of the vaccination record if obtained from local pharmacy or Health Dept. Verbalized acceptance and understanding.  Qualifies for Shingles Vaccine? Yes   Zostavax completed Yes  Shingrix Completed?: Yes  Screening Tests Health Maintenance  Topic Date Due   COVID-19 Vaccine (5 - 2023-24 season) 12/03/2022   Medicare Annual Wellness (AWV)  03/12/2024    Colonoscopy  04/30/2024   DTaP/Tdap/Td (3 - Td or Tdap) 05/29/2029   Pneumonia Vaccine 38+ Years old  Completed   INFLUENZA VACCINE  Completed   Hepatitis C Screening  Completed   Zoster Vaccines- Shingrix  Completed   HPV VACCINES  Aged Out    Health Maintenance  Health Maintenance Due  Topic Date Due   COVID-19 Vaccine (5 - 2023-24 season) 12/03/2022    Colorectal cancer screening: Type of screening: Colonoscopy. Completed 04/30/14. Repeat every 10 years  Lung Cancer Screening: (Low Dose CT Chest recommended if Age 31-80 years, 20 pack-year currently smoking OR have quit w/in 15years.) does not qualify.   Lung Cancer Screening Referral: n/a  Additional Screening:  Hepatitis C Screening: does not qualify; Completed 03/31/20  Vision Screening: Recommended annual ophthalmology exams for early detection of glaucoma and other disorders of the eye.  Dental Screening: Recommended annual dental exams for proper oral hygiene   Community Resource Referral / Chronic Care Management: CRR required this visit?  No   CCM required this visit?  No     Plan:     I have personally reviewed and noted the following in the patient's chart:   Medical and social history Use of alcohol, tobacco or illicit drugs  Current medications and supplements including opioid prescriptions. Patient is not currently taking opioid prescriptions. Functional ability and status Nutritional status Physical activity Advanced directives List of other physicians Hospitalizations, surgeries, and ER visits in previous 12 months Vitals Screenings to include cognitive, depression, and falls Referrals and appointments  In addition, I have reviewed and discussed with patient certain preventive protocols, quality metrics, and best practice recommendations. A written personalized care plan for preventive services as well as general preventive health recommendations were provided to patient.     Tora Kindred,  CMA   03/13/2023   After Visit Summary: (MyChart) Due to this being a telephonic visit, the after visit summary with patients personalized plan was offered to patient via MyChart   Nurse Notes:  Declined covid vaccine

## 2023-04-20 DIAGNOSIS — Z03818 Encounter for observation for suspected exposure to other biological agents ruled out: Secondary | ICD-10-CM | POA: Diagnosis not present

## 2023-04-20 DIAGNOSIS — J209 Acute bronchitis, unspecified: Secondary | ICD-10-CM | POA: Diagnosis not present

## 2023-04-20 DIAGNOSIS — J019 Acute sinusitis, unspecified: Secondary | ICD-10-CM | POA: Diagnosis not present

## 2023-04-20 DIAGNOSIS — B9689 Other specified bacterial agents as the cause of diseases classified elsewhere: Secondary | ICD-10-CM | POA: Diagnosis not present

## 2023-05-03 ENCOUNTER — Other Ambulatory Visit: Payer: Self-pay | Admitting: Nurse Practitioner

## 2023-05-03 ENCOUNTER — Other Ambulatory Visit: Payer: Self-pay

## 2023-05-03 DIAGNOSIS — E785 Hyperlipidemia, unspecified: Secondary | ICD-10-CM

## 2023-05-03 MED ORDER — FENOFIBRATE 145 MG PO TABS: 145.0000 mg | ORAL_TABLET | Freq: Every day | ORAL | 1 refills | Status: DC

## 2023-05-03 NOTE — Telephone Encounter (Signed)
Medication Refill -  Most Recent Primary Care Visit:  Provider: Tora Kindred  Department: [facility]  Visit Type: MEDICARE AWV, SEQUENTIAL  Date: 03/13/2023  Medication: sumatriptan for migraine     Has the patient contacted their pharmacy? Yes (Agent: If no, request that the patient contact the pharmacy for the refill. If patient does not wish to contact the pharmacy document the reason why and proceed with request.) (Agent: If yes, when and what did the pharmacy advise?)  Is this the correct pharmacy for this prescription? Yes If no, delete pharmacy and type the correct one.  This is the patient's preferred pharmacy:  [facility] [id number] [place], [place] - [address] [place] [address] Phone: [phone] Fax: [phone]   Has the prescription been filled recently? No  Is the patient out of the medication? Yes  Has the patient been seen for an appointment in the last year OR does the patient have an upcoming appointment? Yes  Can we respond through MyChart? Yes  Agent: Please be advised that Rx refills may take up to 3 business days. We ask that you follow-up with your pharmacy.

## 2023-05-04 NOTE — Telephone Encounter (Signed)
Requested medication (s) are due for refill today - expired Rx  Requested medication (s) are on the active medication list -yes  Future visit scheduled -yes  Last refill: 03/10/21 #10 3RF  Notes to clinic: expired Rx- sent for review   Requested Prescriptions  Pending Prescriptions Disp Refills   SUMAtriptan (IMITREX) 100 MG tablet 10 tablet 3    Sig: Take 1 tablet (100 mg total) by mouth daily as needed. May repeat in 2 hours if headache persists or recurs.     Neurology:  Migraine Therapy - Triptan Passed - 05/04/2023 12:15 PM      Passed - Last BP in normal range    BP Readings from Last 1 Encounters:  11/07/22 118/76         Passed - Valid encounter within last 12 months    Recent Outpatient Visits           5 months ago Tingling of both feet   [place] [facility] Mecum, Oswaldo Conroy, PA-C   5 months ago Annual physical exam   [place] [facility] Mecum, Oswaldo Conroy, PA-C   1 year ago Essential (primary) hypertension   [place] [facility] Larae Grooms, NP   1 year ago Annual physical exam   [place] [facility] Larae Grooms, NP   1 year ago Psoriasis   [place] [facility] Larae Grooms, NP       Future Appointments             In 1 month Larae Grooms, NP [place] [facility]               Requested Prescriptions  Pending Prescriptions Disp Refills   SUMAtriptan (IMITREX) 100 MG tablet 10 tablet 3    Sig: Take 1 tablet (100 mg total) by mouth daily as needed. May repeat in 2 hours if headache persists or recurs.     Neurology:  Migraine Therapy - Triptan Passed - 05/04/2023 12:15 PM      Passed - Last BP in normal range    BP Readings from Last 1 Encounters:  11/07/22 118/76         Passed - Valid encounter within last 12 months    Recent Outpatient Visits           5 months ago Tingling of both feet   [place] [facility]  Mecum, Oswaldo Conroy, PA-C   5 months ago Annual physical exam   [place] [facility] Mecum, Oswaldo Conroy, PA-C   1 year ago Essential (primary) hypertension   [place] [facility] Larae Grooms, NP   1 year ago Annual physical exam   [place] [facility] Larae Grooms, NP   1 year ago Psoriasis   [place] [facility] Larae Grooms, NP       Future Appointments             In 1 month Larae Grooms, NP [place] [facility], [facility]

## 2023-05-07 MED ORDER — SUMATRIPTAN SUCCINATE 100 MG PO TABS: 100.0000 mg | ORAL_TABLET | Freq: Every day | ORAL | 3 refills | Status: AC | PRN

## 2023-05-14 DIAGNOSIS — N390 Urinary tract infection, site not specified: Secondary | ICD-10-CM | POA: Diagnosis not present

## 2023-05-14 DIAGNOSIS — R39198 Other difficulties with micturition: Secondary | ICD-10-CM | POA: Diagnosis not present

## 2023-05-14 DIAGNOSIS — J019 Acute sinusitis, unspecified: Secondary | ICD-10-CM | POA: Diagnosis not present

## 2023-05-14 DIAGNOSIS — B9689 Other specified bacterial agents as the cause of diseases classified elsewhere: Secondary | ICD-10-CM | POA: Diagnosis not present

## 2023-05-14 DIAGNOSIS — N1832 Chronic kidney disease, stage 3b: Secondary | ICD-10-CM | POA: Diagnosis not present

## 2023-05-17 ENCOUNTER — Other Ambulatory Visit: Payer: Self-pay | Admitting: Physician Assistant

## 2023-05-17 DIAGNOSIS — I10 Essential (primary) hypertension: Secondary | ICD-10-CM

## 2023-05-17 NOTE — Telephone Encounter (Signed)
Requested Prescriptions  Pending Prescriptions Disp Refills   losartan (COZAAR) 100 MG tablet [Pharmacy Med Name: LOSARTAN POTASSIUM 100 MG TAB] 90 tablet 0    Sig: TAKE 1 TABLET BY MOUTH DAILY     Cardiovascular:  Angiotensin Receptor Blockers Failed - 05/17/2023  1:37 PM      Failed - Cr in normal range and within 180 days    Creatinine, Ser  Date Value Ref Range Status  11/07/2022 1.36 (H) 0.76 - 1.27 mg/dL Final         Failed - K in normal range and within 180 days    Potassium  Date Value Ref Range Status  11/07/2022 4.3 3.5 - 5.2 mmol/L Final         Passed - Patient is not pregnant      Passed - Last BP in normal range    BP Readings from Last 1 Encounters:  11/07/22 118/76         Passed - Valid encounter within last 6 months    Recent Outpatient Visits           5 months ago Tingling of both feet   [place] [facility] Mecum, Oswaldo Conroy, PA-C   6 months ago Annual physical exam   [place] [facility] Mecum, Oswaldo Conroy, PA-C   1 year ago Essential (primary) hypertension   [place] [facility] Larae Grooms, NP   1 year ago Annual physical exam   [place] [facility] Larae Grooms, NP   1 year ago Psoriasis   [place] [facility] Larae Grooms, NP       Future Appointments             In 2 weeks Larae Grooms, NP  [facility], [facility]

## 2023-05-19 ENCOUNTER — Other Ambulatory Visit
Admission: RE | Admit: 2023-05-19 | Discharge: 2023-05-19 | Disposition: A | Discharge status: Home / Self Care | Payer: Medicare Other | Source: Ambulatory Visit | Attending: Family Medicine | Admitting: Family Medicine

## 2023-05-19 DIAGNOSIS — R5383 Other fatigue: Secondary | ICD-10-CM | POA: Diagnosis not present

## 2023-05-19 DIAGNOSIS — R509 Fever, unspecified: Secondary | ICD-10-CM | POA: Diagnosis not present

## 2023-05-19 DIAGNOSIS — Z03818 Encounter for observation for suspected exposure to other biological agents ruled out: Secondary | ICD-10-CM | POA: Diagnosis not present

## 2023-05-19 DIAGNOSIS — Z87440 Personal history of urinary (tract) infections: Secondary | ICD-10-CM | POA: Diagnosis not present

## 2023-05-19 LAB — BASIC METABOLIC PANEL
Anion gap: 10 (ref 5–15)
BUN: 40 mg/dL — ABNORMAL HIGH (ref 8–23)
CO2: 27 mmol/L (ref 22–32)
Calcium: 9.2 mg/dL (ref 8.9–10.3)
Chloride: 103 mmol/L (ref 98–111)
Creatinine, Ser: 1.72 mg/dL — ABNORMAL HIGH (ref 0.61–1.24)
GFR, Estimated: 43 mL/min — ABNORMAL LOW (ref >60)
Glucose, Bld: 108 mg/dL — ABNORMAL HIGH (ref 70–99)
Potassium: 4.2 mmol/L (ref 3.5–5.1)
Sodium: 140 mmol/L (ref 135–145)

## 2023-05-31 ENCOUNTER — Other Ambulatory Visit: Payer: Self-pay | Admitting: Physician Assistant

## 2023-05-31 DIAGNOSIS — I10 Essential (primary) hypertension: Secondary | ICD-10-CM

## 2023-05-31 NOTE — Telephone Encounter (Signed)
 Requested Prescriptions  Pending Prescriptions Disp Refills   amLODipine (NORVASC) 10 MG tablet [Pharmacy Med Name: amLODIPine BESYLATE 10MG  TAB] 30 tablet 0    Sig: TAKE 1 TABLET BY MOUTH DAILY     Cardiovascular: Calcium Channel Blockers 2 Passed - 05/31/2023  5:57 PM      Passed - Last BP in normal range    BP Readings from Last 1 Encounters:  11/07/22 118/76         Passed - Last Heart Rate in normal range    Pulse Readings from Last 1 Encounters:  11/07/22 88         Passed - Valid encounter within last 6 months    Recent Outpatient Visits           5 months ago Tingling of both feet   [place] [facility] Mecum, Oswaldo Conroy, PA-C   6 months ago Annual physical exam   [place] [facility] Mecum, Oswaldo Conroy, PA-C   1 year ago Essential (primary) hypertension   [place] [facility] Larae Grooms, NP   1 year ago Annual physical exam   [place] [facility] Larae Grooms, NP   1 year ago Psoriasis   [place] [facility] Larae Grooms, NP       Future Appointments             In 4 days Larae Grooms, NP [place] [facility], [facility]

## 2023-06-04 ENCOUNTER — Ambulatory Visit: Payer: Medicare Other | Admitting: Nurse Practitioner

## 2023-06-04 ENCOUNTER — Encounter: Payer: Self-pay | Admitting: Nurse Practitioner

## 2023-06-04 VITALS — BP 115/74 | HR 83 | Temp 98.9°F | Resp 16 | Ht 72.01 in | Wt 206.8 lb

## 2023-06-04 DIAGNOSIS — I10 Essential (primary) hypertension: Secondary | ICD-10-CM | POA: Diagnosis not present

## 2023-06-04 DIAGNOSIS — Z021 Encounter for pre-employment examination: Secondary | ICD-10-CM | POA: Diagnosis not present

## 2023-06-04 DIAGNOSIS — E785 Hyperlipidemia, unspecified: Secondary | ICD-10-CM | POA: Diagnosis not present

## 2023-06-04 DIAGNOSIS — F5101 Primary insomnia: Secondary | ICD-10-CM

## 2023-06-04 DIAGNOSIS — R739 Hyperglycemia, unspecified: Secondary | ICD-10-CM | POA: Diagnosis not present

## 2023-06-04 DIAGNOSIS — N1832 Chronic kidney disease, stage 3b: Secondary | ICD-10-CM

## 2023-06-04 MED ORDER — TRAZODONE HCL 50 MG PO TABS: 50.0000 mg | ORAL_TABLET | Freq: Every evening | ORAL | 1 refills | Status: DC | PRN

## 2023-06-04 MED ORDER — PANTOPRAZOLE SODIUM 40 MG PO TBEC: DELAYED_RELEASE_TABLET | ORAL | 1 refills | Status: DC

## 2023-06-04 MED ORDER — SERTRALINE HCL 100 MG PO TABS: 200.0000 mg | ORAL_TABLET | Freq: Every day | ORAL | 1 refills | Status: DC

## 2023-06-04 MED ORDER — FENOFIBRATE 145 MG PO TABS: 145.0000 mg | ORAL_TABLET | Freq: Every day | ORAL | 1 refills | Status: DC

## 2023-06-04 MED ORDER — LOSARTAN POTASSIUM 100 MG PO TABS: 100.0000 mg | ORAL_TABLET | Freq: Every day | ORAL | 1 refills | Status: DC

## 2023-06-04 MED ORDER — AMLODIPINE BESYLATE 10 MG PO TABS: 10.0000 mg | ORAL_TABLET | Freq: Every day | ORAL | 1 refills | Status: DC

## 2023-06-04 NOTE — Assessment & Plan Note (Signed)
 Chronic, controlled. He reports some issues with sleep initiation and maintenance despite taking trazodone 100 mg PO at bedtime  Follow up in about 6 months for monitoring to assess response and potential need for regimen change.

## 2023-06-04 NOTE — Assessment & Plan Note (Signed)
 Chronic, controlled. Appears well controlled on current regimen comprised of Losartan 100 mg PO every day and Amlodipine 10 mg PO every day  Refills provided today  Continue current regimen  Recommend he checks BP at home and engages with diet and exercise efforts to assist with cardiovascular health. Follow up in 6 months or sooner if concerns arise

## 2023-06-04 NOTE — Assessment & Plan Note (Signed)
Chronic.  Controlled.  Continue with current medication regimen on Fenofibrate.  Refills sent today.  Labs ordered today.  Return to clinic in 6 months for reevaluation.  Call sooner if concerns arise.

## 2023-06-04 NOTE — Assessment & Plan Note (Signed)
 Chronic.  Controlled.  Continue with current medication regimen.  Recently treated for a UTI.  Slight increased at recent urgent care visit.  Will repeat today.  Discussed dehydration with patient during visit. Labs ordered today.  Return to clinic in 6 months for reevaluation.  Call sooner if concerns arise.

## 2023-06-04 NOTE — Assessment & Plan Note (Signed)
 Labs ordered at visit today.  Will make recommendations based on lab results.

## 2023-06-04 NOTE — Progress Notes (Signed)
 BP 115/74 (BP Location: Left Arm, Patient Position: Sitting, Cuff Size: Normal)   Pulse 83   Temp 98.9 F (37.2 C) (Oral)   Resp 16   Ht 6' 0.01" (1.829 m)   Wt 206 lb 12.8 oz (93.8 kg)   SpO2 99%   BMI 28.04 kg/m    Subjective:    Patient ID: Chris Daugherty, male    DOB: 10/27/1955, 68 y.o.   MRN: [id number]  HPI: Chris Daugherty is a 68 y.o. male  Chief Complaint  Patient presents with   Hypertension   Hyperlipidemia   fever blisters    Post Flu   Labs Only    During his visits at [facility] creatine was abnormal. Part of kidney was removed, was benign.    HYPERTENSION / HYPERLIPIDEMIA Satisfied with current treatment? no Duration of hypertension: years BP monitoring frequency: not checking BP range:  BP medication side effects: no Past BP meds: amlodipine and losartan Duration of hyperlipidemia: years Cholesterol medication side effects: no Cholesterol supplements: none Past cholesterol medications: fenofibrate (tricor) Medication compliance: excellent compliance Aspirin: no Recent stressors: no Recurrent headaches: no Visual changes: no Palpitations: no Dyspnea: no Chest pain: no Lower extremity edema: no Dizzy/lightheaded: no  CHRONIC KIDNEY DISEASE Has a recent UTI and with the had an increase in his Cr.  Baseline is 1.3 and he was increased to 1.7.   CKD status: controlled Medications renally dose: yes Previous renal evaluation: no Pneumovax:  Up to Date Influenza Vaccine:  Up to Date  ANXIETY Patient states his anxiety is much better.  The Zoloft is helping a lot.  Denies SI. The Trazadone is working well for his sleep.  Denies concerns at visit today.    Flowsheet Row Office Visit from 06/04/2023 in [facility]  PHQ-9 Total Score 0         06/04/2023   10:28 AM 12/05/2022   11:20 AM 11/07/2022    1:26 PM 04/27/2022   10:16 AM  GAD 7 : Generalized Anxiety Score  Nervous, Anxious, on Edge 0 0 0 0  Control/stop worrying 0 0 0 0   Worry too much - different things 0 0 0 0  Trouble relaxing 0 0 0 0  Restless 0 0 0 0  Easily annoyed or irritable 0 0 0 0  Afraid - awful might happen 0 0 0 0  Total GAD 7 Score 0 0 0 0  Anxiety Difficulty Not difficult at all Not difficult at all Not difficult at all Not difficult at all      Relevant past medical, surgical, family and social history reviewed and updated as indicated. Interim medical history since our last visit reviewed. Allergies and medications reviewed and updated.  Review of Systems  Eyes:  Negative for visual disturbance.  Respiratory:  Negative for chest tightness and shortness of breath.   Cardiovascular:  Negative for chest pain, palpitations and leg swelling.  Neurological:  Negative for dizziness, light-headedness and headaches.  Psychiatric/Behavioral:  Negative for dysphoric mood, sleep disturbance and suicidal ideas. The patient is not nervous/anxious.     Per HPI unless specifically indicated above     Objective:    BP 115/74 (BP Location: Left Arm, Patient Position: Sitting, Cuff Size: Normal)   Pulse 83   Temp 98.9 F (37.2 C) (Oral)   Resp 16   Ht 6' 0.01" (1.829 m)   Wt 206 lb 12.8 oz (93.8 kg)   SpO2 99%   BMI 28.04 kg/m  Wt Readings from Last 3 Encounters:  06/04/23 206 lb 12.8 oz (93.8 kg)  03/13/23 215 lb (97.5 kg)  11/07/22 206 lb 9.6 oz (93.7 kg)    Physical Exam Vitals and nursing note reviewed.  Constitutional:      General: He is not in acute distress.    Appearance: Normal appearance. He is not ill-appearing, toxic-appearing or diaphoretic.  HENT:     Head: Normocephalic.     Right Ear: External ear normal.     Left Ear: External ear normal.     Nose: Nose normal. No congestion or rhinorrhea.     Mouth/Throat:     Mouth: Mucous membranes are moist.  Eyes:     General:        Right eye: No discharge.        Left eye: No discharge.     Extraocular Movements: Extraocular movements intact.      Conjunctiva/sclera: Conjunctivae normal.     Pupils: Pupils are equal, round, and reactive to light.  Cardiovascular:     Rate and Rhythm: Normal rate and regular rhythm.     Heart sounds: No murmur heard. Pulmonary:     Effort: Pulmonary effort is normal. No respiratory distress.     Breath sounds: Normal breath sounds. No wheezing, rhonchi or rales.  Abdominal:     General: Abdomen is flat. Bowel sounds are normal.  Musculoskeletal:     Cervical back: Normal range of motion and neck supple.  Skin:    General: Skin is warm and dry.     Capillary Refill: Capillary refill takes less than 2 seconds.  Neurological:     General: No focal deficit present.     Mental Status: He is alert and oriented to person, place, and time.  Psychiatric:        Mood and Affect: Mood normal.        Behavior: Behavior normal.        Thought Content: Thought content normal.        Judgment: Judgment normal.     Results for orders placed or performed during the hospital encounter of 05/19/23  Basic metabolic panel   Collection Time: 05/19/23 10:30 AM  Result Value Ref Range   Sodium 140 135 - 145 mmol/L   Potassium 4.2 3.5 - 5.1 mmol/L   Chloride 103 98 - 111 mmol/L   CO2 27 22 - 32 mmol/L   Glucose, Bld 108 (H) 70 - 99 mg/dL   BUN 40 (H) 8 - 23 mg/dL   Creatinine, Ser 1.61 (H) 0.61 - 1.24 mg/dL   Calcium 9.2 8.9 - 09.6 mg/dL   GFR, Estimated 43 (L) >60 mL/min   Anion gap 10 5 - 15      Assessment & Plan:   Problem List Items Addressed This Visit       Cardiovascular and Mediastinum   Essential (primary) hypertension   Chronic, controlled. Appears well controlled on current regimen comprised of Losartan 100 mg PO every day and Amlodipine 10 mg PO every day  Refills provided today  Continue current regimen  Recommend he checks BP at home and engages with diet and exercise efforts to assist with cardiovascular health. Follow up in 6 months or sooner if concerns arise       Relevant  Medications   amLODipine (NORVASC) 10 MG tablet   fenofibrate (TRICOR) 145 MG tablet   losartan (COZAAR) 100 MG tablet   Other Relevant Orders   Comp Met (CMET)  Genitourinary   Stage 3b chronic kidney disease (HCC) - Primary   Chronic.  Controlled.  Continue with current medication regimen.  Recently treated for a UTI.  Slight increased at recent urgent care visit.  Will repeat today.  Discussed dehydration with patient during visit. Labs ordered today.  Return to clinic in 6 months for reevaluation.  Call sooner if concerns arise.       Relevant Orders   Comp Met (CMET)   CBC w/Diff     Other   Insomnia   Chronic, controlled. He reports some issues with sleep initiation and maintenance despite taking trazodone 100 mg PO at bedtime  Follow up in about 6 months for monitoring to assess response and potential need for regimen change.       Relevant Medications   traZODone (DESYREL) 50 MG tablet   Hyperlipidemia   Chronic.  Controlled.  Continue with current medication regimen on Fenofibrate.  Refills sent today.  Labs ordered today.  Return to clinic in 6 months for reevaluation.  Call sooner if concerns arise.       Relevant Medications   amLODipine (NORVASC) 10 MG tablet   fenofibrate (TRICOR) 145 MG tablet   losartan (COZAAR) 100 MG tablet   Other Relevant Orders   Lipid Profile   Elevated blood sugar   Labs ordered at visit today.  Will make recommendations based on lab results.        Relevant Orders   HgB A1c   Other Visit Diagnoses       Pre-employment drug screening       Relevant Orders   Measles/Mumps/Rubella Immunity         Follow up plan: Return in about 6 months (around 12/05/2023) for Physical and Fasting labs.

## 2023-06-05 ENCOUNTER — Encounter: Payer: Self-pay | Admitting: Nurse Practitioner

## 2023-06-05 LAB — COMPREHENSIVE METABOLIC PANEL
ALT: 12 IU/L (ref 0–44)
AST: 20 IU/L (ref 0–40)
Albumin: 4.5 g/dL (ref 3.9–4.9)
Alkaline Phosphatase: 54 IU/L (ref 44–121)
BUN/Creatinine Ratio: 15 (ref 10–24)
BUN: 19 mg/dL (ref 8–27)
Bilirubin Total: 0.4 mg/dL (ref 0.0–1.2)
CO2: 22 mmol/L (ref 20–29)
Calcium: 9.2 mg/dL (ref 8.6–10.2)
Chloride: 104 mmol/L (ref 96–106)
Creatinine, Ser: 1.3 mg/dL — ABNORMAL HIGH (ref 0.76–1.27)
Globulin, Total: 2 g/dL (ref 1.5–4.5)
Glucose: 104 mg/dL — ABNORMAL HIGH (ref 70–99)
Potassium: 4 mmol/L (ref 3.5–5.2)
Sodium: 141 mmol/L (ref 134–144)
Total Protein: 6.5 g/dL (ref 6.0–8.5)
eGFR: 60 mL/min/{1.73_m2} (ref >59)

## 2023-06-05 LAB — MEASLES/MUMPS/RUBELLA IMMUNITY
MUMPS ABS, IGG: 18.7 [AU]/ml (ref >10.9)
RUBEOLA AB, IGG: >300 [AU]/ml (ref >16.4)
Rubella Antibodies, IGG: 14 {index} (ref >0.99)

## 2023-06-05 LAB — CBC WITH DIFFERENTIAL/PLATELET
Basophils Absolute: 0 10*3/uL (ref 0.0–0.2)
Basos: 2 %
EOS (ABSOLUTE): 0.1 10*3/uL (ref 0.0–0.4)
Eos: 5 %
Hematocrit: 36.2 % — ABNORMAL LOW (ref 37.5–51.0)
Hemoglobin: 11.9 g/dL — ABNORMAL LOW (ref 13.0–17.7)
Immature Grans (Abs): 0 10*3/uL (ref 0.0–0.1)
Immature Granulocytes: 0 %
Lymphocytes Absolute: 0.5 10*3/uL — ABNORMAL LOW (ref 0.7–3.1)
Lymphs: 20 %
MCH: 27 pg (ref 26.6–33.0)
MCHC: 32.9 g/dL (ref 31.5–35.7)
MCV: 82 fL (ref 79–97)
Monocytes Absolute: 0.2 10*3/uL (ref 0.1–0.9)
Monocytes: 7 %
Neutrophils Absolute: 1.8 10*3/uL (ref 1.4–7.0)
Neutrophils: 66 %
Platelets: 143 10*3/uL — ABNORMAL LOW (ref 150–450)
RBC: 4.4 x10E6/uL (ref 4.14–5.80)
RDW: 14.5 % (ref 11.6–15.4)
WBC: 2.7 10*3/uL — ABNORMAL LOW (ref 3.4–10.8)

## 2023-06-05 LAB — LIPID PANEL
Chol/HDL Ratio: 5.5 ratio — ABNORMAL HIGH (ref 0.0–5.0)
Cholesterol, Total: 183 mg/dL (ref 100–199)
HDL: 33 mg/dL — ABNORMAL LOW (ref >39)
LDL Chol Calc (NIH): 120 mg/dL — ABNORMAL HIGH (ref 0–99)
Triglycerides: 168 mg/dL — ABNORMAL HIGH (ref 0–149)
VLDL Cholesterol Cal: 30 mg/dL (ref 5–40)

## 2023-06-05 LAB — HEMOGLOBIN A1C
Est. average glucose Bld gHb Est-mCnc: 108 mg/dL
Hgb A1c MFr Bld: 5.4 % (ref 4.8–5.6)

## 2023-07-26 DIAGNOSIS — Z03818 Encounter for observation for suspected exposure to other biological agents ruled out: Secondary | ICD-10-CM | POA: Diagnosis not present

## 2023-07-26 DIAGNOSIS — J069 Acute upper respiratory infection, unspecified: Secondary | ICD-10-CM | POA: Diagnosis not present

## 2023-07-30 DIAGNOSIS — R509 Fever, unspecified: Secondary | ICD-10-CM | POA: Diagnosis not present

## 2023-07-30 DIAGNOSIS — Z03818 Encounter for observation for suspected exposure to other biological agents ruled out: Secondary | ICD-10-CM | POA: Diagnosis not present

## 2023-07-30 DIAGNOSIS — J029 Acute pharyngitis, unspecified: Secondary | ICD-10-CM | POA: Diagnosis not present

## 2023-07-30 DIAGNOSIS — J40 Bronchitis, not specified as acute or chronic: Secondary | ICD-10-CM | POA: Diagnosis not present

## 2023-08-14 ENCOUNTER — Ambulatory Visit: Admitting: Nurse Practitioner

## 2023-09-01 DIAGNOSIS — J019 Acute sinusitis, unspecified: Secondary | ICD-10-CM | POA: Diagnosis not present

## 2023-09-01 DIAGNOSIS — B9689 Other specified bacterial agents as the cause of diseases classified elsewhere: Secondary | ICD-10-CM | POA: Diagnosis not present

## 2023-09-01 DIAGNOSIS — Z03818 Encounter for observation for suspected exposure to other biological agents ruled out: Secondary | ICD-10-CM | POA: Diagnosis not present

## 2023-10-02 DIAGNOSIS — L570 Actinic keratosis: Secondary | ICD-10-CM | POA: Diagnosis not present

## 2023-10-02 DIAGNOSIS — D2262 Melanocytic nevi of left upper limb, including shoulder: Secondary | ICD-10-CM | POA: Diagnosis not present

## 2023-10-02 DIAGNOSIS — D0339 Melanoma in situ of other parts of face: Secondary | ICD-10-CM | POA: Diagnosis not present

## 2023-10-02 DIAGNOSIS — D2271 Melanocytic nevi of right lower limb, including hip: Secondary | ICD-10-CM | POA: Diagnosis not present

## 2023-10-02 DIAGNOSIS — D485 Neoplasm of uncertain behavior of skin: Secondary | ICD-10-CM | POA: Diagnosis not present

## 2023-10-02 DIAGNOSIS — Z85828 Personal history of other malignant neoplasm of skin: Secondary | ICD-10-CM | POA: Diagnosis not present

## 2023-10-02 DIAGNOSIS — D2261 Melanocytic nevi of right upper limb, including shoulder: Secondary | ICD-10-CM | POA: Diagnosis not present

## 2023-10-02 DIAGNOSIS — D2272 Melanocytic nevi of left lower limb, including hip: Secondary | ICD-10-CM | POA: Diagnosis not present

## 2023-10-02 DIAGNOSIS — C44329 Squamous cell carcinoma of skin of other parts of face: Secondary | ICD-10-CM | POA: Diagnosis not present

## 2023-11-20 DIAGNOSIS — D0439 Carcinoma in situ of skin of other parts of face: Secondary | ICD-10-CM | POA: Diagnosis not present

## 2023-11-20 DIAGNOSIS — C44329 Squamous cell carcinoma of skin of other parts of face: Secondary | ICD-10-CM | POA: Diagnosis not present

## 2023-12-08 DIAGNOSIS — S63521A Sprain of radiocarpal joint of right wrist, initial encounter: Secondary | ICD-10-CM | POA: Diagnosis not present

## 2023-12-10 ENCOUNTER — Encounter: Admitting: Nurse Practitioner

## 2023-12-10 DIAGNOSIS — L814 Other melanin hyperpigmentation: Secondary | ICD-10-CM | POA: Diagnosis not present

## 2023-12-10 DIAGNOSIS — L578 Other skin changes due to chronic exposure to nonionizing radiation: Secondary | ICD-10-CM | POA: Diagnosis not present

## 2023-12-10 DIAGNOSIS — D0339 Melanoma in situ of other parts of face: Secondary | ICD-10-CM | POA: Diagnosis not present

## 2023-12-11 DIAGNOSIS — J029 Acute pharyngitis, unspecified: Secondary | ICD-10-CM | POA: Diagnosis not present

## 2023-12-11 DIAGNOSIS — R051 Acute cough: Secondary | ICD-10-CM | POA: Diagnosis not present

## 2023-12-18 ENCOUNTER — Ambulatory Visit (INDEPENDENT_AMBULATORY_CARE_PROVIDER_SITE_OTHER): Admitting: Nurse Practitioner

## 2023-12-18 ENCOUNTER — Encounter: Payer: Self-pay | Admitting: Nurse Practitioner

## 2023-12-18 VITALS — BP 106/62 | HR 89 | Temp 98.2°F | Ht 72.0 in | Wt 211.2 lb

## 2023-12-18 DIAGNOSIS — Z0000 Encounter for general adult medical examination without abnormal findings: Secondary | ICD-10-CM

## 2023-12-18 DIAGNOSIS — I10 Essential (primary) hypertension: Secondary | ICD-10-CM

## 2023-12-18 DIAGNOSIS — F5101 Primary insomnia: Secondary | ICD-10-CM | POA: Diagnosis not present

## 2023-12-18 DIAGNOSIS — E785 Hyperlipidemia, unspecified: Secondary | ICD-10-CM

## 2023-12-18 DIAGNOSIS — N1832 Chronic kidney disease, stage 3b: Secondary | ICD-10-CM | POA: Diagnosis not present

## 2023-12-18 DIAGNOSIS — I129 Hypertensive chronic kidney disease with stage 1 through stage 4 chronic kidney disease, or unspecified chronic kidney disease: Secondary | ICD-10-CM

## 2023-12-18 MED ORDER — SERTRALINE HCL 100 MG PO TABS: 200.0000 mg | ORAL_TABLET | Freq: Every day | ORAL | 1 refills | Status: AC

## 2023-12-18 MED ORDER — TRAZODONE HCL 50 MG PO TABS: 50.0000 mg | ORAL_TABLET | Freq: Every evening | ORAL | 1 refills | Status: AC | PRN

## 2023-12-18 MED ORDER — LOSARTAN POTASSIUM 100 MG PO TABS: 100.0000 mg | ORAL_TABLET | Freq: Every day | ORAL | 1 refills | Status: AC

## 2023-12-18 MED ORDER — PANTOPRAZOLE SODIUM 40 MG PO TBEC: DELAYED_RELEASE_TABLET | ORAL | 1 refills | Status: AC

## 2023-12-18 MED ORDER — FENOFIBRATE 145 MG PO TABS: 145.0000 mg | ORAL_TABLET | Freq: Every day | ORAL | 1 refills | Status: AC

## 2023-12-18 MED ORDER — AMLODIPINE BESYLATE 10 MG PO TABS: 10.0000 mg | ORAL_TABLET | Freq: Every day | ORAL | 1 refills | Status: AC

## 2023-12-18 NOTE — Assessment & Plan Note (Signed)
Chronic.  Controlled.  Continue with current medication regimen on Fenofibrate.  Refills sent today.  Labs ordered today.  Return to clinic in 6 months for reevaluation.  Call sooner if concerns arise.

## 2023-12-18 NOTE — Assessment & Plan Note (Signed)
 Chronic.  Controlled.  Continue with current medication regimen.  Labs ordered today.  Return to clinic in 6 months for reevaluation.  Call sooner if concerns arise.  ? ?

## 2023-12-18 NOTE — Progress Notes (Deleted)
 There were no vitals taken for this visit.   Subjective:    Patient ID: Chris Daugherty, male    DOB: [birth year], 67 y.o.   MRN: [id number]  HPI: Chris Daugherty is a 68 y.o. male  No chief complaint on file.  HYPERTENSION / HYPERLIPIDEMIA Satisfied with current treatment? no Duration of hypertension: years BP monitoring frequency: not checking BP range:  BP medication side effects: no Past BP meds: amlodipine  and losartan  Duration of hyperlipidemia: years Cholesterol medication side effects: no Cholesterol supplements: none Past cholesterol medications: fenofibrate  (tricor ) Medication compliance: excellent compliance Aspirin: no Recent stressors: no Recurrent headaches: no Visual changes: no Palpitations: no Dyspnea: no Chest pain: no Lower extremity edema: no Dizzy/lightheaded: no  CHRONIC KIDNEY DISEASE Has a recent UTI and with the had an increase in his Cr.  Baseline is 1.3 and he was increased to 1.7.   CKD status: controlled Medications renally dose: yes Previous renal evaluation: no Pneumovax:  Up to Date Influenza Vaccine:  Up to Date  ANXIETY Patient states his anxiety is much better.  The Zoloft  is helping a lot.  Denies SI. The Trazadone is working well for his sleep.  Denies concerns at visit today.    Flowsheet Row Office Visit from 06/04/2023 in [facility]  PHQ-9 Total Score 0      06/04/2023   10:28 AM 12/05/2022   11:20 AM 11/07/2022    1:26 PM 04/27/2022   10:16 AM  GAD 7 : Generalized Anxiety Score  Nervous, Anxious, on Edge 0 0 0 0  Control/stop worrying 0 0 0 0  Worry too much - different things 0 0 0 0  Trouble relaxing 0 0 0 0  Restless 0 0 0 0  Easily annoyed or irritable 0 0 0 0  Afraid - awful might happen 0 0 0 0  Total GAD 7 Score 0 0 0 0  Anxiety Difficulty Not difficult at all Not difficult at all Not difficult at all Not difficult at all      Relevant past medical, surgical, family and social history  reviewed and updated as indicated. Interim medical history since our last visit reviewed. Allergies and medications reviewed and updated.  Review of Systems  Eyes:  Negative for visual disturbance.  Respiratory:  Negative for chest tightness and shortness of breath.   Cardiovascular:  Negative for chest pain, palpitations and leg swelling.  Neurological:  Negative for dizziness, light-headedness and headaches.  Psychiatric/Behavioral:  Negative for dysphoric mood, sleep disturbance and suicidal ideas. The patient is not nervous/anxious.     Per HPI unless specifically indicated above     Objective:    There were no vitals taken for this visit.  Wt Readings from Last 3 Encounters:  06/04/23 206 lb 12.8 oz (93.8 kg)  03/13/23 215 lb (97.5 kg)  11/07/22 206 lb 9.6 oz (93.7 kg)    Physical Exam Vitals and nursing note reviewed.  Constitutional:      General: He is not in acute distress.    Appearance: Normal appearance. He is not ill-appearing, toxic-appearing or diaphoretic.  HENT:     Head: Normocephalic.     Right Ear: External ear normal.     Left Ear: External ear normal.     Nose: Nose normal. No congestion or rhinorrhea.     Mouth/Throat:     Mouth: Mucous membranes are moist.  Eyes:     General:        Right eye: No  discharge.        Left eye: No discharge.     Extraocular Movements: Extraocular movements intact.     Conjunctiva/sclera: Conjunctivae normal.     Pupils: Pupils are equal, round, and reactive to light.  Cardiovascular:     Rate and Rhythm: Normal rate and regular rhythm.     Heart sounds: No murmur heard. Pulmonary:     Effort: Pulmonary effort is normal. No respiratory distress.     Breath sounds: Normal breath sounds. No wheezing, rhonchi or rales.  Abdominal:     General: Abdomen is flat. Bowel sounds are normal.  Musculoskeletal:     Cervical back: Normal range of motion and neck supple.  Skin:    General: Skin is warm and dry.     Capillary  Refill: Capillary refill takes less than 2 seconds.  Neurological:     General: No focal deficit present.     Mental Status: He is alert and oriented to person, place, and time.  Psychiatric:        Mood and Affect: Mood normal.        Behavior: Behavior normal.        Thought Content: Thought content normal.        Judgment: Judgment normal.     Results for orders placed or performed in visit on 06/04/23  Comp Met (CMET)   Collection Time: 06/04/23 10:43 AM  Result Value Ref Range   Glucose 104 (H) 70 - 99 mg/dL   BUN 19 8 - 27 mg/dL   Creatinine, Ser 8.69 (H) 0.76 - 1.27 mg/dL   eGFR 60 >40 fO/fpw/8.26   BUN/Creatinine Ratio 15 10 - 24   Sodium 141 134 - 144 mmol/L   Potassium 4.0 3.5 - 5.2 mmol/L   Chloride 104 96 - 106 mmol/L   CO2 22 20 - 29 mmol/L   Calcium 9.2 8.6 - 10.2 mg/dL   Total Protein 6.5 6.0 - 8.5 g/dL   Albumin 4.5 3.9 - 4.9 g/dL   Globulin, Total 2.0 1.5 - 4.5 g/dL   Bilirubin Total 0.4 0.0 - 1.2 mg/dL   Alkaline Phosphatase 54 44 - 121 IU/L   AST 20 0 - 40 IU/L   ALT 12 0 - 44 IU/L  Lipid Profile   Collection Time: 06/04/23 10:43 AM  Result Value Ref Range   Cholesterol, Total 183 100 - 199 mg/dL   Triglycerides 831 (H) 0 - 149 mg/dL   HDL 33 (L) >60 mg/dL   VLDL Cholesterol Cal 30 5 - 40 mg/dL   LDL Chol Calc (NIH) 879 (H) 0 - 99 mg/dL   Chol/HDL Ratio 5.5 (H) 0.0 - 5.0 ratio  HgB A1c   Collection Time: 06/04/23 10:43 AM  Result Value Ref Range   Hgb A1c MFr Bld 5.4 4.8 - 5.6 %   Est. average glucose Bld gHb Est-mCnc 108 mg/dL  CBC w/Diff   Collection Time: 06/04/23 10:43 AM  Result Value Ref Range   WBC 2.7 (L) 3.4 - 10.8 x10E3/uL   RBC 4.40 4.14 - 5.80 x10E6/uL   Hemoglobin 11.9 (L) 13.0 - 17.7 g/dL   Hematocrit 63.7 (L) 62.4 - 51.0 %   MCV 82 79 - 97 fL   MCH 27.0 26.6 - 33.0 pg   MCHC 32.9 31.5 - 35.7 g/dL   RDW 85.4 88.3 - 84.5 %   Platelets 143 (L) 150 - 450 x10E3/uL   Neutrophils 66 Not Estab. %   Lymphs 20 Not  Estab. %    Monocytes 7 Not Estab. %   Eos 5 Not Estab. %   Basos 2 Not Estab. %   Neutrophils Absolute 1.8 1.4 - 7.0 x10E3/uL   Lymphocytes Absolute 0.5 (L) 0.7 - 3.1 x10E3/uL   Monocytes Absolute 0.2 0.1 - 0.9 x10E3/uL   EOS (ABSOLUTE) 0.1 0.0 - 0.4 x10E3/uL   Basophils Absolute 0.0 0.0 - 0.2 x10E3/uL   Immature Granulocytes 0 Not Estab. %   Immature Grans (Abs) 0.0 0.0 - 0.1 x10E3/uL  Measles/Mumps/Rubella Immunity   Collection Time: 06/04/23 10:43 AM  Result Value Ref Range   Rubella Antibodies, IGG 14.00 Immune >0.99 index   RUBEOLA AB, IGG >300.0 Immune >16.4 AU/mL   MUMPS ABS, IGG 18.7 Immune >10.9 AU/mL      Assessment & Plan:   Problem List Items Addressed This Visit       Genitourinary   Hypertensive renal disease   Stage 3b chronic kidney disease (HCC) - Primary     Other   Hyperlipidemia      Follow up plan: No follow-ups on file.

## 2023-12-18 NOTE — Assessment & Plan Note (Signed)
 Chronic, stable. Checking CMP today. Will treat and adjust regimen based on results.  Will keep blood pressure under control. Follow up in 6 months.

## 2023-12-18 NOTE — Progress Notes (Signed)
 BP 106/62   Pulse 89   Temp 98.2 F (36.8 C) (Oral)   Ht 6' (1.829 m)   Wt 211 lb 3.2 oz (95.8 kg)   SpO2 97%   BMI 28.64 kg/m    Subjective:    Patient ID: Chris Daugherty, male    DOB: [birth year], 68 y.o.   MRN: [id number]  HPI: Daemon Dowty is a 68 y.o. male presenting on 12/18/2023 for comprehensive medical examination. Current medical complaints include:none  He currently lives with: Interim Problems from his last visit: no  HYPERTENSION / HYPERLIPIDEMIA Satisfied with current treatment? no Duration of hypertension: years BP monitoring frequency: not checking BP range:  BP medication side effects: no Past BP meds: amlodipine  and losartan  Duration of hyperlipidemia: years Cholesterol medication side effects: no Cholesterol supplements: none Past cholesterol medications: fenofibrate  (tricor ) Medication compliance: excellent compliance Aspirin: no Recent stressors: no Recurrent headaches: no Visual changes: no Palpitations: no Dyspnea: no Chest pain: no Lower extremity edema: no Dizzy/lightheaded: no  CHRONIC KIDNEY DISEASE Baseline is 1.3 and he was increased to 1.7.   CKD status: controlled Medications renally dose: yes Previous renal evaluation: no Pneumovax:  Up to Date Influenza Vaccine:  Up to Date  ANXIETY Patient states his anxiety is much better.  He feels like the Sertrazline 200mg  is a good dose and working well for him. Denies SI. The Trazadone is working well for his sleep.  Denies concerns at visit today.    Flowsheet Row Office Visit from 12/18/2023 in [facility]  PHQ-9 Total Score 0      12/18/2023   10:28 AM 06/04/2023   10:28 AM 12/05/2022   11:20 AM 11/07/2022    1:26 PM  GAD 7 : Generalized Anxiety Score  Nervous, Anxious, on Edge 0 0 0 0  Control/stop worrying 0 0 0 0  Worry too much - different things 0 0 0 0  Trouble relaxing 0 0 0 0  Restless 0 0 0 0  Easily annoyed or irritable 0 0 0 0  Afraid - awful  might happen 0 0 0 0  Total GAD 7 Score 0 0 0 0  Anxiety Difficulty Not difficult at all Not difficult at all Not difficult at all Not difficult at all     Depression Screen done today and results listed below:     12/18/2023   10:27 AM 06/04/2023   10:28 AM 03/13/2023    1:39 PM 12/05/2022   11:20 AM 11/07/2022    1:26 PM  Depression screen PHQ 2/9  Decreased Interest 0 0 0 0 0  Down, Depressed, Hopeless 0 0 0 0 0  PHQ - 2 Score 0 0 0 0 0  Altered sleeping 0 0 0 0 1  Tired, decreased energy 0 0 0 1 0  Change in appetite 0 0 0 0 0  Feeling bad or failure about yourself  0 0 0 0 0  Trouble concentrating 0 0 0 0 0  Moving slowly or fidgety/restless 0 0 0 0 0  Suicidal thoughts 0 0 0 0 0  PHQ-9 Score 0 0 0 1 1  Difficult doing work/chores Not difficult at all Not difficult at all Not difficult at all Not difficult at all     The patient does not have a history of falls. I did complete a risk assessment for falls. A plan of care for falls was documented.   Past Medical History:  Past Medical History:  Diagnosis Date  .  Acid reflux   . Allergic rhinitis   . Allergy   . Anxiety   . Depression   . Hyperlipidemia   . Hypertension   . Testicular hypofunction     Surgical History:  Past Surgical History:  Procedure Laterality Date  . BASAL CELL CARCINOMA EXCISION     left side of face  . CHOLECYSTECTOMY    . COLONOSCOPY  04/30/2014  . GASTRIC FUNDOPLICATION    . INGUINAL HERNIA REPAIR    . PARTIAL NEPHRECTOMY Left   . VASECTOMY      Medications:  Current Outpatient Medications on File Prior to Visit  Medication Sig  . clobetasol ointment (TEMOVATE) 0.05 % Apply 1 Application topically 2 (two) times daily as needed.  . methocarbamol  (ROBAXIN ) 750 MG tablet Take 1 tablet (750 mg total) by mouth 2 (two) times daily.  . promethazine  (PHENERGAN ) 25 MG tablet Take 1 tablet as needed for nausea and vomiting  . SUMAtriptan  (IMITREX ) 100 MG tablet Take 1 tablet (100 mg total) by  mouth daily as needed. May repeat in 2 hours if headache persists or recurs.   No current facility-administered medications on file prior to visit.    Allergies:  No Known Allergies  Social History:  Social History   Socioeconomic History  . Marital status: Married    Spouse name: Apolinar  . Number of children: 1  . Years of education: Not on file  . Highest education level: Not on file  Occupational History  . Occupation: pastor  Tobacco Use  . Smoking status: Former    Current packs/day: 0.00    Average packs/day: 1 pack/day for 3.0 years (3.0 ttl pk-yrs)    Types: Cigarettes    Start date: 74    Quit date: 71    Years since quitting: 48.7  . Smokeless tobacco: Never  Vaping Use  . Vaping status: Never Used  Substance and Sexual Activity  . Alcohol use: No  . Drug use: No  . Sexual activity: Yes  Other Topics Concern  . Not on file  Social History Narrative   Works Engineer, production   Social Drivers of Health   Financial Resource Strain: Low Risk  (03/13/2023)   Overall Financial Resource Strain (CARDIA)   . Difficulty of Paying Living Expenses: Not hard at all  Food Insecurity: No Food Insecurity (03/13/2023)   Hunger Vital Sign   . Worried About Programme researcher, broadcasting/film/video in the Last Year: Never true   . Ran Out of Food in the Last Year: Never true  Transportation Needs: No Transportation Needs (03/13/2023)   PRAPARE - Transportation   . Lack of Transportation (Medical): No   . Lack of Transportation (Non-Medical): No  Physical Activity: Inactive (03/13/2023)   Exercise Vital Sign   . Days of Exercise per Week: 0 days   . Minutes of Exercise per Session: 0 min  Stress: No Stress Concern Present (03/13/2023)   [employer] of Occupational Health - Occupational Stress Questionnaire   . Feeling of Stress : Only a little  Social Connections: Moderately Integrated (03/13/2023)   Social Connection and Isolation Panel   . Frequency of Communication with  Friends and Family: More than three times a week   . Frequency of Social Gatherings with Friends and Family: More than three times a week   . Attends Religious Services: More than 4 times per year   . Active Member of Clubs or Organizations: No   . Attends Banker Meetings:  Never   . Marital Status: Married  Catering manager Violence: Not At Risk (03/13/2023)   Humiliation, Afraid, Rape, and Kick questionnaire   . Fear of Current or Ex-Partner: No   . Emotionally Abused: No   . Physically Abused: No   . Sexually Abused: No   Social History   Tobacco Use  Smoking Status Former  . Current packs/day: 0.00  . Average packs/day: 1 pack/day for 3.0 years (3.0 ttl pk-yrs)  . Types: Cigarettes  . Start date: 79  . Quit date: 43  . Years since quitting: 48.7  Smokeless Tobacco Never   Social History   Substance and Sexual Activity  Alcohol Use No    Family History:  Family History  Problem Relation Age of Onset  . Hypertension Mother   . Other Father 60       auto accident  . Cancer Maternal Grandfather 39       stomaCH    Past medical history, surgical history, medications, allergies, family history and social history reviewed with patient today and changes made to appropriate areas of the chart.   Review of Systems  Eyes:  Negative for blurred vision and double vision.  Respiratory:  Negative for shortness of breath.   Cardiovascular:  Negative for chest pain, palpitations and leg swelling.  Neurological:  Negative for dizziness and headaches.  Psychiatric/Behavioral:  Positive for depression. Negative for suicidal ideas. The patient is nervous/anxious.    All other ROS negative except what is listed above and in the HPI.      Objective:    BP 106/62   Pulse 89   Temp 98.2 F (36.8 C) (Oral)   Ht 6' (1.829 m)   Wt 211 lb 3.2 oz (95.8 kg)   SpO2 97%   BMI 28.64 kg/m   Wt Readings from Last 3 Encounters:  12/18/23 211 lb 3.2 oz (95.8 kg)   06/04/23 206 lb 12.8 oz (93.8 kg)  03/13/23 215 lb (97.5 kg)    Physical Exam Vitals and nursing note reviewed.  Constitutional:      General: He is not in acute distress.    Appearance: Normal appearance. He is not ill-appearing, toxic-appearing or diaphoretic.  HENT:     Head: Normocephalic.     Right Ear: Tympanic membrane, ear canal and external ear normal.     Left Ear: Tympanic membrane, ear canal and external ear normal.     Nose: Nose normal. No congestion or rhinorrhea.     Mouth/Throat:     Mouth: Mucous membranes are moist.  Eyes:     General:        Right eye: No discharge.        Left eye: No discharge.     Extraocular Movements: Extraocular movements intact.     Conjunctiva/sclera: Conjunctivae normal.     Pupils: Pupils are equal, round, and reactive to light.  Cardiovascular:     Rate and Rhythm: Normal rate and regular rhythm.     Heart sounds: No murmur heard. Pulmonary:     Effort: Pulmonary effort is normal. No respiratory distress.     Breath sounds: Normal breath sounds. No wheezing, rhonchi or rales.  Abdominal:     General: Abdomen is flat. Bowel sounds are normal. There is no distension.     Palpations: Abdomen is soft.     Tenderness: There is no abdominal tenderness. There is no guarding.  Musculoskeletal:     Cervical back: Normal range of motion and  neck supple.  Skin:    General: Skin is warm and dry.     Capillary Refill: Capillary refill takes less than 2 seconds.  Neurological:     General: No focal deficit present.     Mental Status: He is alert and oriented to person, place, and time.     Cranial Nerves: No cranial nerve deficit.     Motor: No weakness.     Deep Tendon Reflexes: Reflexes normal.  Psychiatric:        Mood and Affect: Mood normal.        Behavior: Behavior normal.        Thought Content: Thought content normal.        Judgment: Judgment normal.     Results for orders placed or performed in visit on 06/04/23  Comp  Met (CMET)   Collection Time: 06/04/23 10:43 AM  Result Value Ref Range   Glucose 104 (H) 70 - 99 mg/dL   BUN 19 8 - 27 mg/dL   Creatinine, Ser 8.69 (H) 0.76 - 1.27 mg/dL   eGFR 60 >40 fO/fpw/8.26   BUN/Creatinine Ratio 15 10 - 24   Sodium 141 134 - 144 mmol/L   Potassium 4.0 3.5 - 5.2 mmol/L   Chloride 104 96 - 106 mmol/L   CO2 22 20 - 29 mmol/L   Calcium 9.2 8.6 - 10.2 mg/dL   Total Protein 6.5 6.0 - 8.5 g/dL   Albumin 4.5 3.9 - 4.9 g/dL   Globulin, Total 2.0 1.5 - 4.5 g/dL   Bilirubin Total 0.4 0.0 - 1.2 mg/dL   Alkaline Phosphatase 54 44 - 121 IU/L   AST 20 0 - 40 IU/L   ALT 12 0 - 44 IU/L  Lipid Profile   Collection Time: 06/04/23 10:43 AM  Result Value Ref Range   Cholesterol, Total 183 100 - 199 mg/dL   Triglycerides 831 (H) 0 - 149 mg/dL   HDL 33 (L) >60 mg/dL   VLDL Cholesterol Cal 30 5 - 40 mg/dL   LDL Chol Calc (NIH) 879 (H) 0 - 99 mg/dL   Chol/HDL Ratio 5.5 (H) 0.0 - 5.0 ratio  HgB A1c   Collection Time: 06/04/23 10:43 AM  Result Value Ref Range   Hgb A1c MFr Bld 5.4 4.8 - 5.6 %   Est. average glucose Bld gHb Est-mCnc 108 mg/dL  CBC w/Diff   Collection Time: 06/04/23 10:43 AM  Result Value Ref Range   WBC 2.7 (L) 3.4 - 10.8 x10E3/uL   RBC 4.40 4.14 - 5.80 x10E6/uL   Hemoglobin 11.9 (L) 13.0 - 17.7 g/dL   Hematocrit 63.7 (L) 62.4 - 51.0 %   MCV 82 79 - 97 fL   MCH 27.0 26.6 - 33.0 pg   MCHC 32.9 31.5 - 35.7 g/dL   RDW 85.4 88.3 - 84.5 %   Platelets 143 (L) 150 - 450 x10E3/uL   Neutrophils 66 Not Estab. %   Lymphs 20 Not Estab. %   Monocytes 7 Not Estab. %   Eos 5 Not Estab. %   Basos 2 Not Estab. %   Neutrophils Absolute 1.8 1.4 - 7.0 x10E3/uL   Lymphocytes Absolute 0.5 (L) 0.7 - 3.1 x10E3/uL   Monocytes Absolute 0.2 0.1 - 0.9 x10E3/uL   EOS (ABSOLUTE) 0.1 0.0 - 0.4 x10E3/uL   Basophils Absolute 0.0 0.0 - 0.2 x10E3/uL   Immature Granulocytes 0 Not Estab. %   Immature Grans (Abs) 0.0 0.0 - 0.1 x10E3/uL  Measles/Mumps/Rubella Immunity  Collection  Time: 06/04/23 10:43 AM  Result Value Ref Range   Rubella Antibodies, IGG 14.00 Immune >0.99 index   RUBEOLA AB, IGG >300.0 Immune >16.4 AU/mL   MUMPS ABS, IGG 18.7 Immune >10.9 AU/mL      Assessment & Plan:   Problem List Items Addressed This Visit       Cardiovascular and Mediastinum   Essential (primary) hypertension   Relevant Medications   amLODipine  (NORVASC ) 10 MG tablet   fenofibrate  (TRICOR ) 145 MG tablet   losartan  (COZAAR ) 100 MG tablet     Genitourinary   Hypertensive renal disease   Chronic, stable. Checking CMP today. Will treat and adjust regimen based on results.  Will keep blood pressure under control. Follow up in 6 months.      Relevant Medications   amLODipine  (NORVASC ) 10 MG tablet   losartan  (COZAAR ) 100 MG tablet   Stage 3b chronic kidney disease (HCC) - Primary   Chronic.  Controlled.  Continue with current medication regimen. Labs ordered today.  Return to clinic in 6 months for reevaluation.  Call sooner if concerns arise.         Other   Insomnia   Relevant Medications   traZODone  (DESYREL ) 50 MG tablet   Hyperlipidemia   Chronic.  Controlled.  Continue with current medication regimen on Fenofibrate .  Refills sent today.  Labs ordered today.  Return to clinic in 6 months for reevaluation.  Call sooner if concerns arise.       Relevant Medications   amLODipine  (NORVASC ) 10 MG tablet   fenofibrate  (TRICOR ) 145 MG tablet   losartan  (COZAAR ) 100 MG tablet   Other Relevant Orders   Lipid panel   Other Visit Diagnoses       Annual physical exam       Relevant Orders   TSH   PSA   Lipid panel   CBC with Differential/Platelet   Comprehensive metabolic panel with GFR        Discussed aspirin prophylaxis for myocardial infarction prevention and decision was it was not indicated  LABORATORY TESTING:  Health maintenance labs ordered today as discussed above.   The natural history of prostate cancer and ongoing controversy regarding  screening and potential treatment outcomes of prostate cancer has been discussed with the patient. The meaning of a false positive PSA and a false negative PSA has been discussed. He indicates understanding of the limitations of this screening test and wishes to proceed with screening PSA testing.   IMMUNIZATIONS:   - Tdap: Tetanus vaccination status reviewed: last tetanus booster within 10 years. - Influenza: Up to date - Pneumovax: Up to date - Prevnar: Up to date - COVID: Not applicable - HPV: Not applicable - Shingrix vaccine: Up to date  SCREENING: - Colonoscopy: Up to date  Discussed with patient purpose of the colonoscopy is to detect colon cancer at curable precancerous or early stages   - AAA Screening: Up to date  -Hearing Test: Not applicable  -Spirometry: Not applicable   PATIENT COUNSELING:    Sexuality: Discussed sexually transmitted diseases, partner selection, use of condoms, avoidance of unintended pregnancy  and contraceptive alternatives.   Advised to avoid cigarette smoking.  I discussed with the patient that most people either abstain from alcohol or drink within safe limits (<=14/week and <=4 drinks/occasion for males, <=7/weeks and <= 3 drinks/occasion for females) and that the risk for alcohol disorders and other health effects rises proportionally with the number of drinks per week and  how often a drinker exceeds daily limits.  Discussed cessation/primary prevention of drug use and availability of treatment for abuse.   Diet: Encouraged to adjust caloric intake to maintain  or achieve ideal body weight, to reduce intake of dietary saturated fat and total fat, to limit sodium intake by avoiding high sodium foods and not adding table salt, and to maintain adequate dietary potassium and calcium preferably from fresh fruits, vegetables, and low-fat dairy products.    stressed the importance of regular exercise  Injury prevention: Discussed safety belts, safety  helmets, smoke detector, smoking near bedding or upholstery.   Dental health: Discussed importance of regular tooth brushing, flossing, and dental visits.   Follow up plan: NEXT PREVENTATIVE PHYSICAL DUE IN 1 YEAR. Return in about 6 months (around 06/16/2024) for HTN, HLD, DM2 FU.

## 2023-12-19 ENCOUNTER — Ambulatory Visit: Payer: Self-pay | Admitting: Nurse Practitioner

## 2023-12-19 DIAGNOSIS — R7989 Other specified abnormal findings of blood chemistry: Secondary | ICD-10-CM

## 2023-12-19 LAB — COMPREHENSIVE METABOLIC PANEL WITH GFR
ALT: 21 IU/L (ref 0–44)
AST: 27 IU/L (ref 0–40)
Albumin: 4.3 g/dL (ref 3.9–4.9)
Alkaline Phosphatase: 49 IU/L (ref 47–123)
BUN/Creatinine Ratio: 17 (ref 10–24)
BUN: 23 mg/dL (ref 8–27)
Bilirubin Total: 0.4 mg/dL (ref 0.0–1.2)
CO2: 25 mmol/L (ref 20–29)
Calcium: 9.1 mg/dL (ref 8.6–10.2)
Chloride: 101 mmol/L (ref 96–106)
Creatinine, Ser: 1.34 mg/dL — ABNORMAL HIGH (ref 0.76–1.27)
Globulin, Total: 1.8 g/dL (ref 1.5–4.5)
Glucose: 93 mg/dL (ref 70–99)
Potassium: 4 mmol/L (ref 3.5–5.2)
Sodium: 139 mmol/L (ref 134–144)
Total Protein: 6.1 g/dL (ref 6.0–8.5)
eGFR: 57 mL/min/1.73 — ABNORMAL LOW (ref >59)

## 2023-12-19 LAB — CBC WITH DIFFERENTIAL/PLATELET
Basophils Absolute: 0 x10E3/uL (ref 0.0–0.2)
Basos: 1 %
EOS (ABSOLUTE): 0.2 x10E3/uL (ref 0.0–0.4)
Eos: 5 %
Hematocrit: 41.2 % (ref 37.5–51.0)
Hemoglobin: 12.7 g/dL — ABNORMAL LOW (ref 13.0–17.7)
Immature Grans (Abs): 0 x10E3/uL (ref 0.0–0.1)
Immature Granulocytes: 0 %
Lymphocytes Absolute: 0.8 x10E3/uL (ref 0.7–3.1)
Lymphs: 23 %
MCH: 25.7 pg — ABNORMAL LOW (ref 26.6–33.0)
MCHC: 30.8 g/dL — ABNORMAL LOW (ref 31.5–35.7)
MCV: 83 fL (ref 79–97)
Monocytes Absolute: 0.4 x10E3/uL (ref 0.1–0.9)
Monocytes: 12 %
Neutrophils Absolute: 2.2 x10E3/uL (ref 1.4–7.0)
Neutrophils: 58 %
Platelets: 145 x10E3/uL — ABNORMAL LOW (ref 150–450)
RBC: 4.94 x10E6/uL (ref 4.14–5.80)
RDW: 14.6 % (ref 11.6–15.4)
WBC: 3.7 x10E3/uL (ref 3.4–10.8)

## 2023-12-19 LAB — LIPID PANEL
Chol/HDL Ratio: 4.5 ratio (ref 0.0–5.0)
Cholesterol, Total: 165 mg/dL (ref 100–199)
HDL: 37 mg/dL — ABNORMAL LOW (ref >39)
LDL Chol Calc (NIH): 93 mg/dL (ref 0–99)
Triglycerides: 203 mg/dL — ABNORMAL HIGH (ref 0–149)
VLDL Cholesterol Cal: 35 mg/dL (ref 5–40)

## 2023-12-19 LAB — TSH: TSH: 8.11 u[IU]/mL — ABNORMAL HIGH (ref 0.450–4.500)

## 2023-12-19 LAB — PSA: Prostate Specific Ag, Serum: 2.7 ng/mL (ref 0.0–4.0)

## 2023-12-27 DIAGNOSIS — Z85828 Personal history of other malignant neoplasm of skin: Secondary | ICD-10-CM | POA: Diagnosis not present

## 2023-12-27 DIAGNOSIS — Z08 Encounter for follow-up examination after completed treatment for malignant neoplasm: Secondary | ICD-10-CM | POA: Diagnosis not present

## 2023-12-31 DIAGNOSIS — L923 Foreign body granuloma of the skin and subcutaneous tissue: Secondary | ICD-10-CM | POA: Diagnosis not present

## 2024-01-02 ENCOUNTER — Other Ambulatory Visit: Payer: Self-pay | Admitting: Nurse Practitioner

## 2024-01-02 DIAGNOSIS — Z8669 Personal history of other diseases of the nervous system and sense organs: Secondary | ICD-10-CM

## 2024-01-03 NOTE — Telephone Encounter (Signed)
 Requested medications are due for refill today.  yes  Requested medications are on the active medications list.  yes  Last refill. 07/19/2022 #180 0 rf  Future visit scheduled.   yes  Notes to clinic.  Refill not delegated.    Requested Prescriptions  Pending Prescriptions Disp Refills   methocarbamol  (ROBAXIN ) 750 MG tablet [Pharmacy Med Name: METHOCARBAMOL  750 MG TABLET] 180 tablet 0    Sig: TAKE 1 TABLET BY MOUTH 2 TIMES A DAY     Not Delegated - Analgesics:  Muscle Relaxants Failed - 01/03/2024  5:59 PM      Failed - This refill cannot be delegated      Passed - Valid encounter within last 6 months    Recent Outpatient Visits           2 weeks ago Stage 3b chronic kidney disease (HCC)   [place] [facility] Melvin Pao, NP   7 months ago Stage 3b chronic kidney disease [facility])   [place] [facility] Melvin Pao, NP

## 2024-01-20 ENCOUNTER — Other Ambulatory Visit: Payer: Self-pay | Admitting: Nurse Practitioner

## 2024-01-20 DIAGNOSIS — F5101 Primary insomnia: Secondary | ICD-10-CM

## 2024-01-22 DIAGNOSIS — Z85828 Personal history of other malignant neoplasm of skin: Secondary | ICD-10-CM | POA: Diagnosis not present

## 2024-01-22 NOTE — Telephone Encounter (Signed)
 Requested Prescriptions  Refused Prescriptions Disp Refills   traZODone  (DESYREL ) 50 MG tablet [Pharmacy Med Name: traZODone  50 MG TABLET] 180 tablet 1    Sig: TAKE 1-2 TABLETS BY MOUTH AT BEDTIME AS NEEDED FOR SLEEP     Psychiatry: Antidepressants - Serotonin Modulator Passed - 01/22/2024 12:12 PM      Passed - Valid encounter within last 6 months    Recent Outpatient Visits           1 month ago Stage 3b chronic kidney disease (HCC)   [place] [facility] Melvin Pao, NP   7 months ago Stage 3b chronic kidney disease [facility])   [place] [facility] Melvin Pao, NP

## 2024-02-21 ENCOUNTER — Other Ambulatory Visit: Payer: Self-pay | Admitting: Nurse Practitioner

## 2024-02-21 DIAGNOSIS — I10 Essential (primary) hypertension: Secondary | ICD-10-CM

## 2024-02-22 NOTE — Telephone Encounter (Signed)
 Requested Prescriptions  Refused Prescriptions Disp Refills   losartan  (COZAAR ) 100 MG tablet [Pharmacy Med Name: LOSARTAN  POTASSIUM 100 MG TAB] 90 tablet 1    Sig: TAKE 1 TABLET BY MOUTH DAILY     Cardiovascular:  Angiotensin Receptor Blockers Failed - 02/22/2024  5:18 PM      Failed - Cr in normal range and within 180 days    Creatinine, Ser  Date Value Ref Range Status  12/18/2023 1.34 (H) 0.76 - 1.27 mg/dL Final         Passed - K in normal range and within 180 days    Potassium  Date Value Ref Range Status  12/18/2023 4.0 3.5 - 5.2 mmol/L Final         Passed - Patient is not pregnant      Passed - Last BP in normal range    BP Readings from Last 1 Encounters:  12/18/23 106/62         Passed - Valid encounter within last 6 months    Recent Outpatient Visits           2 months ago Stage 3b chronic kidney disease (HCC)   [place] [facility] Melvin Pao, NP   8 months ago Stage 3b chronic kidney disease [facility])   [place] [facility] Melvin Pao, NP

## 2024-04-10 ENCOUNTER — Ambulatory Visit

## 2024-04-23 ENCOUNTER — Telehealth: Payer: Self-pay | Admitting: Nurse Practitioner

## 2024-04-23 NOTE — Telephone Encounter (Signed)
 "  Insurance has been updated.  "

## 2024-04-24 ENCOUNTER — Ambulatory Visit (INDEPENDENT_AMBULATORY_CARE_PROVIDER_SITE_OTHER): Admitting: Emergency Medicine

## 2024-04-24 VITALS — Ht 72.0 in | Wt 215.0 lb

## 2024-04-24 DIAGNOSIS — Z0000 Encounter for general adult medical examination without abnormal findings: Secondary | ICD-10-CM | POA: Diagnosis not present

## 2024-04-24 NOTE — Patient Instructions (Signed)
 [name],  Thank you for taking the time for your Medicare Wellness Visit. I appreciate your continued commitment to your health goals. Please review the care plan we discussed, and feel free to reach out if I can assist you further.  Please note that Annual Wellness Visits do not include a physical exam. Some assessments may be limited, especially if the visit was conducted virtually. If needed, we may recommend an in-person follow-up with your provider.  Ongoing Care Seeing your primary care provider every 3 to 6 months helps us  monitor your health and provide consistent, personalized care.   Referrals If a referral was made during today's visit and you haven't received any updates within two weeks, please contact the referred provider directly to check on the status.  Recommended Screenings:   Follow up with GI for your colonoscopy, if you do not hear back from them.   Health Maintenance  Topic Date Due   COVID-19 Vaccine (5 - 2025-26 season) 12/03/2023   Medicare Annual Wellness Visit  03/12/2024   Colon Cancer Screening  04/30/2024   DTaP/Tdap/Td vaccine (3 - Td or Tdap) 05/29/2029   Pneumococcal Vaccine for age over 33  Completed   Flu Shot  Completed   Hepatitis C Screening  Completed   Zoster (Shingles) Vaccine  Completed   Meningitis B Vaccine  Aged Out       04/24/2024    1:48 PM  Advanced Directives  Does Patient Have a Medical Advance Directive? No  Would patient like information on creating a medical advance directive? Yes (MAU/Ambulatory/Procedural Areas - Information given)  Information on Advanced Care Planning can be found at [place]  Secretary of [facility] Advance Health Care Directives Advance Health Care Directives ([email or web address]  You may also get the forms at your doctor's office.  Vision: Annual vision screenings are recommended for early detection of glaucoma, cataracts, and diabetic retinopathy. These exams can also reveal signs of chronic conditions such  as diabetes and high blood pressure.  Dental: Annual dental screenings help detect early signs of oral cancer, gum disease, and other conditions linked to overall health, including heart disease and diabetes.  Please see the attached documents for additional preventive care recommendations.

## 2024-04-24 NOTE — Progress Notes (Signed)
 "  Chief Complaint  Patient presents with   Medicare Wellness     Subjective:   Chris Daugherty is a 69 y.o. male who presents for a Medicare Annual Wellness Visit.  Visit info / Clinical Intake: Medicare Wellness Visit Type:: Subsequent Annual Wellness Visit Persons participating in visit and providing information:: patient Medicare Wellness Visit Mode:: Telephone If telephone:: video declined Since this visit was completed virtually, some vitals may be partially provided or unavailable. Missing vitals are due to the limitations of the virtual format.: Documented vitals are patient reported If Telephone or Video please confirm:: I connected with patient using audio/video enable telemedicine. I verified patient identity with two identifiers, discussed telehealth limitations, and patient agreed to proceed. Patient Location:: home Provider Location:: clinic office Interpreter Needed?: No Pre-visit prep was completed: yes AWV questionnaire completed by patient prior to visit?: yes Date:: 04/24/24 Living arrangements:: lives with spouse/significant other Patient's Overall Health Status Rating: very good Typical amount of pain: some Does pain affect daily life?: no Are you currently prescribed opioids?: no  Dietary Habits and Nutritional Risks How many meals a day?: 2 Eats fruit and vegetables daily?: yes Most meals are obtained by: preparing own meals In the last 2 weeks, have you had any of the following?: (!) nausea, vomiting, diarrhea (nausea and diarrhea from recent illness) Diabetic:: no  Functional Status Activities of Daily Living (to include ambulation/medication): Independent Ambulation: Independent with device- listed below Home Assistive Devices/Equipment: Eyeglasses Medication Administration: Independent Home Management (perform basic housework or laundry): Independent Manage your own finances?: yes Primary transportation is: driving Concerns about vision?: no  *vision screening is required for WTM* Concerns about hearing?: no  Fall Screening Falls in the past year?: 0 Number of falls in past year: 0 Was there an injury with Fall?: 0 Fall Risk Category Calculator: 0 Patient Fall Risk Level: Low Fall Risk  Fall Risk Patient at Risk for Falls Due to: No Fall Risks Fall risk Follow up: Falls evaluation completed  Home and Transportation Safety: All rugs have non-skid backing?: yes All stairs or steps have railings?: yes Grab bars in the bathtub or shower?: yes Have non-skid surface in bathtub or shower?: yes Good home lighting?: yes Regular seat belt use?: yes Hospital stays in the last year:: no  Cognitive Assessment Difficulty concentrating, remembering, or making decisions? : no Will 6CIT or Mini Cog be Completed: yes What year is it?: 0 points What month is it?: 0 points Give patient an address phrase to remember (5 components): [address] [place] About what time is it?: 0 points Count backwards from 20 to 1: 0 points Say the months of the year in reverse: 0 points Repeat the address phrase from earlier: 0 points 6 CIT Score: 0 points  Advance Directives (For Healthcare) Does Patient Have a Medical Advance Directive?: No Would patient like information on creating a medical advance directive?: Yes (MAU/Ambulatory/Procedural Areas - Information given)  Reviewed/Updated  Reviewed/Updated: Reviewed All (Medical, Surgical, Family, Medications, Allergies, Care Teams, Patient Goals)    Allergies (verified) Patient has no known allergies.   Current Medications (verified) Outpatient Encounter Medications as of 04/24/2024  Medication Sig   amLODipine  (NORVASC ) 10 MG tablet Take 1 tablet (10 mg total) by mouth daily.   clobetasol ointment (TEMOVATE) 0.05 % Apply 1 Application topically 2 (two) times daily as needed.   fenofibrate  (TRICOR ) 145 MG tablet Take 1 tablet (145 mg total) by mouth daily.   levofloxacin (LEVAQUIN) 500  MG tablet Take  500 mg by mouth daily.   losartan  (COZAAR ) 100 MG tablet Take 1 tablet (100 mg total) by mouth daily.   methocarbamol  (ROBAXIN ) 750 MG tablet TAKE 1 TABLET BY MOUTH 2 TIMES A DAY   pantoprazole  (PROTONIX ) 40 MG tablet TAKE 1 TABLET BY MOUTH DAILY 30-60 MINUTES BEFORE FIRST MEAL OF THE DAY   promethazine  (PHENERGAN ) 25 MG tablet Take 1 tablet as needed for nausea and vomiting   sertraline  (ZOLOFT ) 100 MG tablet Take 2 tablets (200 mg total) by mouth daily.   SUMAtriptan  (IMITREX ) 100 MG tablet Take 1 tablet (100 mg total) by mouth daily as needed. May repeat in 2 hours if headache persists or recurs.   traZODone  (DESYREL ) 50 MG tablet Take 1-2 tablets (50-100 mg total) by mouth at bedtime as needed for sleep.   No facility-administered encounter medications on file as of 04/24/2024.    History: Past Medical History:  Diagnosis Date   Acid reflux    Allergic rhinitis    Allergy    Anxiety    Depression    Hyperlipidemia    Hypertension    Testicular hypofunction    Past Surgical History:  Procedure Laterality Date   BASAL CELL CARCINOMA EXCISION     left side of face   CHOLECYSTECTOMY     COLONOSCOPY  04/30/2014   GASTRIC FUNDOPLICATION     INGUINAL HERNIA REPAIR     PARTIAL NEPHRECTOMY Left    VASECTOMY     Family History  Problem Relation Age of Onset   Hypertension Mother    Other Father 61       auto accident   Cancer Maternal Grandfather 74       stomaCH   Social History   Occupational History   Occupation: education officer, environmental  Tobacco Use   Smoking status: Former    Current packs/day: 0.00    Average packs/day: 1 pack/day for 3.0 years (3.0 ttl pk-yrs)    Types: Cigarettes    Start date: 83    Quit date: 54    Years since quitting: 49.0   Smokeless tobacco: Never  Vaping Use   Vaping status: Never Used  Substance and Sexual Activity   Alcohol use: No   Drug use: No   Sexual activity: Yes   Tobacco Counseling Counseling given: Not  Answered  SDOH Screenings   Food Insecurity: No Food Insecurity (04/24/2024)  Housing: Low Risk (04/24/2024)  Transportation Needs: No Transportation Needs (04/24/2024)  Utilities: Not At Risk (04/24/2024)  Alcohol Screen: Low Risk (03/13/2023)  Depression (PHQ2-9): Low Risk (04/24/2024)  Financial Resource Strain: Low Risk (04/24/2024)  Physical Activity: Inactive (04/24/2024)  Social Connections: Socially Integrated (04/24/2024)  Stress: No Stress Concern Present (04/24/2024)  Tobacco Use: Medium Risk (04/24/2024)  Health Literacy: Adequate Health Literacy (04/24/2024)   See flowsheets for full screening details  Depression Screen PHQ 2 & 9 Depression Scale- Over the past 2 weeks, how often have you been bothered by any of the following problems? Little interest or pleasure in doing things: 0 Feeling down, depressed, or hopeless (PHQ Adolescent also includes...irritable): 0 PHQ-2 Total Score: 0 Trouble falling or staying asleep, or sleeping too much: 0 Feeling tired or having little energy: 0 Poor appetite or overeating (PHQ Adolescent also includes...weight loss): 0 Feeling bad about yourself - or that you are a failure or have let yourself or your family down: 0 Trouble concentrating on things, such as reading the newspaper or watching television (PHQ Adolescent also includes...like school work): 0  Moving or speaking so slowly that other people could have noticed. Or the opposite - being so fidgety or restless that you have been moving around a lot more than usual: 0 Thoughts that you would be better off dead, or of hurting yourself in some way: 0 PHQ-9 Total Score: 0 If you checked off any problems, how difficult have these problems made it for you to do your work, take care of things at home, or get along with other people?: Not difficult at all  Depression Treatment Depression Interventions/Treatment : [id number] Score <4 Follow-up Not Indicated; Currently on Treatment     Goals  Addressed               This Visit's Progress     COMPLETED: Patient Stated (pt-stated)        Maintain current health      stay healthy and active (pt-stated)               Objective:    Today's Vitals   04/24/24 1344  Weight: 215 lb (97.5 kg)  Height: 6' (1.829 m)   Body mass index is 29.16 kg/m.  Hearing/Vision screen Hearing Screening - Comments:: Denies hearing loss  Vision Screening - Comments:: UTD w/ Dr. Zachary Pippins Immunizations and Health Maintenance Health Maintenance  Topic Date Due   COVID-19 Vaccine (5 - 2025-26 season) 12/03/2023   Colonoscopy  04/30/2024   Medicare Annual Wellness (AWV)  04/24/2025   DTaP/Tdap/Td (3 - Td or Tdap) 05/29/2029   Pneumococcal Vaccine: 50+ Years  Completed   Influenza Vaccine  Completed   Hepatitis C Screening  Completed   Zoster Vaccines- Shingrix  Completed   Meningococcal B Vaccine  Aged Out        Assessment/Plan:  This is a routine wellness examination for Marquinn.  Patient Care Team: Melvin Pao, NP as PCP - General Dasher, Alm LABOR, MD (Dermatology) Pippins Zachary, OD (Optometry) Gregorio Adine MATSU, MD as Referring Physician (Dermatology)  I have personally reviewed and noted the following in the patients chart:   Medical and social history Use of alcohol, tobacco or illicit drugs  Current medications and supplements including opioid prescriptions. Functional ability and status Nutritional status Physical activity Advanced directives List of other physicians Hospitalizations, surgeries, and ER visits in previous 12 months Vitals Screenings to include cognitive, depression, and falls Referrals and appointments  No orders of the defined types were placed in this encounter.  In addition, I have reviewed and discussed with patient certain preventive protocols, quality metrics, and best practice recommendations. A written personalized care plan for preventive services as well as  general preventive health recommendations were provided to patient.   Vina Ned, CMA   04/24/2024   Return in 1 year (on 04/28/2025) for Medicare Annual Wellness Visit.  After Visit Summary: (MyChart) Due to this being a telephonic visit, the after visit summary with patients personalized plan was offered to patient via MyChart   Nurse Notes:  GI has reached out to patient to schedule colonoscopy due ~ 04/30/24 Declined Covid vaccines "

## 2024-05-07 ENCOUNTER — Encounter: Payer: Self-pay | Admitting: Nurse Practitioner

## 2024-05-08 ENCOUNTER — Encounter: Payer: Self-pay | Admitting: Nurse Practitioner

## 2024-05-23 ENCOUNTER — Ambulatory Visit: Admit: 2024-05-23

## 2024-06-16 ENCOUNTER — Ambulatory Visit: Admitting: Nurse Practitioner

## 2024-06-18 ENCOUNTER — Ambulatory Visit: Admitting: Nurse Practitioner

## 2025-04-28 ENCOUNTER — Ambulatory Visit: Payer: Self-pay
